# Patient Record
Sex: Female | Born: 1945 | Race: White | Hispanic: No | Smoking: Former smoker
Health system: Southern US, Community
[De-identification: ages and names within clinical notes are randomized; demographics above are authoritative.]

## PROBLEM LIST (undated history)

## (undated) DIAGNOSIS — N3941 Urge incontinence: Secondary | ICD-10-CM

## (undated) DIAGNOSIS — E119 Type 2 diabetes mellitus without complications: Secondary | ICD-10-CM

## (undated) DIAGNOSIS — F32A Depression, unspecified: Secondary | ICD-10-CM

## (undated) DIAGNOSIS — F329 Major depressive disorder, single episode, unspecified: Secondary | ICD-10-CM

## (undated) DIAGNOSIS — I10 Essential (primary) hypertension: Secondary | ICD-10-CM

## (undated) DIAGNOSIS — I739 Peripheral vascular disease, unspecified: Secondary | ICD-10-CM

## (undated) DIAGNOSIS — G8929 Other chronic pain: Secondary | ICD-10-CM

## (undated) DIAGNOSIS — C679 Malignant neoplasm of bladder, unspecified: Secondary | ICD-10-CM

## (undated) DIAGNOSIS — E785 Hyperlipidemia, unspecified: Secondary | ICD-10-CM

## (undated) DIAGNOSIS — M545 Other chronic pain: Secondary | ICD-10-CM

## (undated) DIAGNOSIS — Z862 Personal history of diseases of the blood and blood-forming organs and certain disorders involving the immune mechanism: Secondary | ICD-10-CM

## (undated) DIAGNOSIS — G43909 Migraine, unspecified, not intractable, without status migrainosus: Secondary | ICD-10-CM

## (undated) DIAGNOSIS — Z8701 Personal history of pneumonia (recurrent): Secondary | ICD-10-CM

## (undated) DIAGNOSIS — Z8709 Personal history of other diseases of the respiratory system: Secondary | ICD-10-CM

## (undated) DIAGNOSIS — E039 Hypothyroidism, unspecified: Secondary | ICD-10-CM

## (undated) DIAGNOSIS — M1990 Unspecified osteoarthritis, unspecified site: Secondary | ICD-10-CM

## (undated) DIAGNOSIS — Z9221 Personal history of antineoplastic chemotherapy: Secondary | ICD-10-CM

## (undated) DIAGNOSIS — Z9289 Personal history of other medical treatment: Secondary | ICD-10-CM

## (undated) HISTORY — PX: CATARACT EXTRACTION W/ INTRAOCULAR LENS  IMPLANT, BILATERAL: SHX1307

## (undated) HISTORY — DX: Peripheral vascular disease, unspecified: I73.9

## (undated) HISTORY — DX: Malignant neoplasm of bladder, unspecified: C67.9

## (undated) HISTORY — DX: Hyperlipidemia, unspecified: E78.5

## (undated) HISTORY — DX: Low back pain: M54.5

## (undated) HISTORY — DX: Other chronic pain: M54.50

## (undated) HISTORY — DX: Major depressive disorder, single episode, unspecified: F32.9

## (undated) HISTORY — DX: Depression, unspecified: F32.A

## (undated) HISTORY — DX: Other chronic pain: G89.29

## (undated) HISTORY — DX: Urge incontinence: N39.41

## (undated) HISTORY — PX: ABDOMINAL HYSTERECTOMY: SHX81

---
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
## 1997-12-18 HISTORY — PX: DILATION AND CURETTAGE OF UTERUS: SHX78

## 1997-12-18 HISTORY — PX: OTHER SURGICAL HISTORY: SHX169

## 1997-12-18 LAB — HM COLONOSCOPY

## 2008-12-18 HISTORY — PX: OTHER SURGICAL HISTORY: SHX169

## 2010-12-18 HISTORY — PX: OTHER SURGICAL HISTORY: SHX169

## 2012-12-18 HISTORY — PX: OTHER SURGICAL HISTORY: SHX169

## 2015-04-05 DIAGNOSIS — N3289 Other specified disorders of bladder: Secondary | ICD-10-CM | POA: Diagnosis not present

## 2015-04-21 DIAGNOSIS — C679 Malignant neoplasm of bladder, unspecified: Secondary | ICD-10-CM | POA: Diagnosis not present

## 2015-04-28 DIAGNOSIS — C679 Malignant neoplasm of bladder, unspecified: Secondary | ICD-10-CM | POA: Diagnosis not present

## 2015-05-12 DIAGNOSIS — C679 Malignant neoplasm of bladder, unspecified: Secondary | ICD-10-CM | POA: Diagnosis not present

## 2015-05-26 DIAGNOSIS — C679 Malignant neoplasm of bladder, unspecified: Secondary | ICD-10-CM | POA: Diagnosis not present

## 2015-06-02 DIAGNOSIS — C679 Malignant neoplasm of bladder, unspecified: Secondary | ICD-10-CM | POA: Diagnosis not present

## 2015-06-09 DIAGNOSIS — C679 Malignant neoplasm of bladder, unspecified: Secondary | ICD-10-CM | POA: Diagnosis not present

## 2015-06-22 DIAGNOSIS — I10 Essential (primary) hypertension: Secondary | ICD-10-CM | POA: Diagnosis not present

## 2015-06-22 DIAGNOSIS — E780 Pure hypercholesterolemia: Secondary | ICD-10-CM | POA: Diagnosis not present

## 2015-06-22 DIAGNOSIS — E119 Type 2 diabetes mellitus without complications: Secondary | ICD-10-CM | POA: Diagnosis not present

## 2015-06-29 DIAGNOSIS — Z23 Encounter for immunization: Secondary | ICD-10-CM | POA: Diagnosis not present

## 2015-06-29 DIAGNOSIS — N2889 Other specified disorders of kidney and ureter: Secondary | ICD-10-CM | POA: Diagnosis not present

## 2015-06-29 DIAGNOSIS — E780 Pure hypercholesterolemia: Secondary | ICD-10-CM | POA: Diagnosis not present

## 2015-06-29 DIAGNOSIS — I10 Essential (primary) hypertension: Secondary | ICD-10-CM | POA: Diagnosis not present

## 2015-06-29 DIAGNOSIS — E119 Type 2 diabetes mellitus without complications: Secondary | ICD-10-CM | POA: Diagnosis not present

## 2015-07-05 DIAGNOSIS — N2889 Other specified disorders of kidney and ureter: Secondary | ICD-10-CM | POA: Diagnosis not present

## 2015-07-05 DIAGNOSIS — D494 Neoplasm of unspecified behavior of bladder: Secondary | ICD-10-CM | POA: Diagnosis not present

## 2015-07-20 DIAGNOSIS — K5730 Diverticulosis of large intestine without perforation or abscess without bleeding: Secondary | ICD-10-CM | POA: Diagnosis not present

## 2015-07-20 DIAGNOSIS — N281 Cyst of kidney, acquired: Secondary | ICD-10-CM | POA: Diagnosis not present

## 2015-07-20 DIAGNOSIS — N2889 Other specified disorders of kidney and ureter: Secondary | ICD-10-CM | POA: Diagnosis not present

## 2015-08-02 DIAGNOSIS — D494 Neoplasm of unspecified behavior of bladder: Secondary | ICD-10-CM | POA: Diagnosis not present

## 2015-08-11 DIAGNOSIS — C679 Malignant neoplasm of bladder, unspecified: Secondary | ICD-10-CM | POA: Diagnosis not present

## 2015-08-18 DIAGNOSIS — C679 Malignant neoplasm of bladder, unspecified: Secondary | ICD-10-CM | POA: Diagnosis not present

## 2015-08-25 DIAGNOSIS — C679 Malignant neoplasm of bladder, unspecified: Secondary | ICD-10-CM | POA: Diagnosis not present

## 2015-09-27 DIAGNOSIS — J040 Acute laryngitis: Secondary | ICD-10-CM | POA: Diagnosis not present

## 2015-09-27 DIAGNOSIS — J029 Acute pharyngitis, unspecified: Secondary | ICD-10-CM | POA: Diagnosis not present

## 2015-09-27 DIAGNOSIS — Z23 Encounter for immunization: Secondary | ICD-10-CM | POA: Diagnosis not present

## 2015-12-01 DIAGNOSIS — M25512 Pain in left shoulder: Secondary | ICD-10-CM | POA: Diagnosis not present

## 2015-12-01 DIAGNOSIS — M898X1 Other specified disorders of bone, shoulder: Secondary | ICD-10-CM | POA: Diagnosis not present

## 2015-12-01 DIAGNOSIS — C679 Malignant neoplasm of bladder, unspecified: Secondary | ICD-10-CM | POA: Diagnosis not present

## 2015-12-01 DIAGNOSIS — S46812A Strain of other muscles, fascia and tendons at shoulder and upper arm level, left arm, initial encounter: Secondary | ICD-10-CM | POA: Diagnosis not present

## 2015-12-01 DIAGNOSIS — M19012 Primary osteoarthritis, left shoulder: Secondary | ICD-10-CM | POA: Diagnosis not present

## 2015-12-01 DIAGNOSIS — J029 Acute pharyngitis, unspecified: Secondary | ICD-10-CM | POA: Diagnosis not present

## 2015-12-08 DIAGNOSIS — C679 Malignant neoplasm of bladder, unspecified: Secondary | ICD-10-CM | POA: Diagnosis not present

## 2015-12-15 DIAGNOSIS — D494 Neoplasm of unspecified behavior of bladder: Secondary | ICD-10-CM | POA: Diagnosis not present

## 2015-12-19 DIAGNOSIS — C679 Malignant neoplasm of bladder, unspecified: Secondary | ICD-10-CM

## 2015-12-19 HISTORY — DX: Malignant neoplasm of bladder, unspecified: C67.9

## 2015-12-29 LAB — MICROALBUMIN, URINE: MICROALB UR: 1.7

## 2015-12-29 LAB — CBC AND DIFFERENTIAL
HEMATOCRIT: 40 % (ref 36–46)
HEMOGLOBIN: 13 g/dL (ref 12.0–16.0)
NEUTROS ABS: 3718 /uL
PLATELETS: 185 10*3/uL (ref 150–399)
WBC: 5.6 10^3/mL

## 2015-12-29 LAB — BASIC METABOLIC PANEL
BUN: 16 mg/dL (ref 4–21)
Creatinine: 0.8 mg/dL (ref 0.5–1.1)
Glucose: 98 mg/dL
Potassium: 5.3 mmol/L (ref 3.4–5.3)
SODIUM: 143 mmol/L (ref 137–147)

## 2015-12-29 LAB — HEPATIC FUNCTION PANEL
ALK PHOS: 71 U/L (ref 25–125)
ALT: 35 U/L (ref 7–35)
AST: 35 U/L (ref 13–35)
Bilirubin, Total: 0.7 mg/dL

## 2015-12-29 LAB — LIPID PANEL
CHOLESTEROL: 226 mg/dL — AB (ref 0–200)
HDL: 71 mg/dL — AB (ref 35–70)
LDL CALC: 122 mg/dL
TRIGLYCERIDES: 167 mg/dL — AB (ref 40–160)

## 2015-12-29 LAB — TSH: TSH: 2.81 u[IU]/mL (ref 0.41–5.90)

## 2015-12-29 LAB — HEMOGLOBIN A1C: Hemoglobin A1C: 5.9

## 2015-12-31 LAB — HM MAMMOGRAPHY: HM MAMMO: NORMAL (ref 0–4)

## 2016-05-24 LAB — HEMOGLOBIN A1C: HEMOGLOBIN A1C: 5.3

## 2016-07-26 ENCOUNTER — Emergency Department: Payer: Medicare Other

## 2016-07-26 ENCOUNTER — Encounter: Payer: Self-pay | Admitting: *Deleted

## 2016-07-26 ENCOUNTER — Emergency Department
Admission: EM | Admit: 2016-07-26 | Discharge: 2016-07-27 | Disposition: A | Discharge status: Home / Self Care | Payer: Medicare Other | Attending: Emergency Medicine | Admitting: Emergency Medicine

## 2016-07-26 DIAGNOSIS — I10 Essential (primary) hypertension: Secondary | ICD-10-CM | POA: Insufficient documentation

## 2016-07-26 DIAGNOSIS — G43909 Migraine, unspecified, not intractable, without status migrainosus: Secondary | ICD-10-CM | POA: Insufficient documentation

## 2016-07-26 DIAGNOSIS — G43009 Migraine without aura, not intractable, without status migrainosus: Secondary | ICD-10-CM

## 2016-07-26 DIAGNOSIS — R4701 Aphasia: Secondary | ICD-10-CM | POA: Diagnosis not present

## 2016-07-26 DIAGNOSIS — E119 Type 2 diabetes mellitus without complications: Secondary | ICD-10-CM | POA: Diagnosis not present

## 2016-07-26 DIAGNOSIS — R51 Headache: Secondary | ICD-10-CM | POA: Diagnosis not present

## 2016-07-26 HISTORY — DX: Essential (primary) hypertension: I10

## 2016-07-26 HISTORY — DX: Type 2 diabetes mellitus without complications: E11.9

## 2016-07-26 MED ORDER — MORPHINE SULFATE (PF) 2 MG/ML IV SOLN
2.0000 mg | Freq: Once | INTRAVENOUS | Status: AC
  Administered 2016-07-27: 2 mg via INTRAVENOUS
  Filled 2016-07-26: qty 1

## 2016-07-26 MED ORDER — HALOPERIDOL LACTATE 5 MG/ML IJ SOLN
2.5000 mg | Freq: Once | INTRAMUSCULAR | Status: AC
  Administered 2016-07-27: 2.5 mg via INTRAVENOUS
  Filled 2016-07-26: qty 1

## 2016-07-26 MED ORDER — ACETAMINOPHEN 325 MG PO TABS
650.0000 mg | ORAL_TABLET | Freq: Once | ORAL | Status: AC
  Administered 2016-07-27: 650 mg via ORAL
  Filled 2016-07-26: qty 2

## 2016-07-26 MED ORDER — SODIUM CHLORIDE 0.9 % IV BOLUS (SEPSIS)
500.0000 mL | INTRAVENOUS | Status: AC
  Administered 2016-07-27: 500 mL via INTRAVENOUS

## 2016-07-26 NOTE — ED Triage Notes (Signed)
Pt to triage via wheelchair.  Pt reports she has a headache since 1730 today.  Pt has n/v/.  Pt reports she took asa and allegra d without relief.  Pt anxious in triage.  Pt denies fall/injury.  Pt reports a headache 1 week ago but pain got better.  Pt alert.  Speech clear.

## 2016-07-26 NOTE — ED Provider Notes (Signed)
[facility] Emergency Department Provider Note  ____________________________________________   First MD Initiated Contact with Patient 07/26/16 2331     (approximate)  I have reviewed the triage vital signs and the nursing notes.   HISTORY  Chief Complaint Migraine    HPI Stacy Mercado is a 70 y.o. female with no significant past medical history and no history of chronic headaches who presents with a severe headache that she states started gradually but is becoming very severe over the last few hours.  She has had some nausea and several episodes of vomiting as well.  She reports that it is located in the front left part of her head, not involving her eye, and is sharp and throbbing.  Nothing makes it better and light and noise seemed to make it worse.  She has no pain in her neck.  She has not been ill recently.  She denies fever/chills, visual changes, weakness or numbness in any of her extremities, neck pain, abdominal pain, dysuria.  She reports that she had a similar but milder episode about one week ago and that her head hurt for about a day and then it got better.  He only thing that was different about tonight besides the severity was that she reports that when it was at its most severe at home she was trying to talk to her husband but she felt like it was difficult getting her words out.   Past Medical History:  Diagnosis Date  . Diabetes mellitus without complication ([place])   . Hypertension     There are no active problems to display for this patient.   History reviewed. No pertinent surgical history.  Prior to Admission medications   Medication Sig Start Date End Date Taking? Authorizing Provider  docusate sodium (COLACE) 100 MG capsule Take 1 tablet once or twice daily as needed for constipation while taking narcotic pain medicine 07/27/16   Hinda Kehr, MD  HYDROcodone-acetaminophen (NORCO/VICODIN) 5-325 MG tablet Take 1-2 tablets by mouth  every 4 (four) hours as needed for moderate pain. 07/27/16   Hinda Kehr, MD    Allergies Review of patient's allergies indicates no known allergies.  History reviewed. No pertinent family history.  Social History Social History  Substance Use Topics  . Smoking status: Never Smoker  . Smokeless tobacco: Never Used  . Alcohol use Yes    Review of Systems Constitutional: No fever/chills Eyes: No visual changes. ENT: No sore throat. Cardiovascular: Denies chest pain. Respiratory: Denies shortness of breath. Gastrointestinal: No abdominal pain.  +N/V.  No diarrhea.  No constipation. Genitourinary: Negative for dysuria. Musculoskeletal: Negative for back pain. Skin: Negative for rash. Neurological: Severe left-sided frontal headache.  Questionable expressive aphasia  10-point ROS otherwise negative.  ____________________________________________   PHYSICAL EXAM:  VITAL SIGNS: ED Triage Vitals  Enc Vitals Group     BP 07/26/16 2037 129/82     Pulse Rate 07/26/16 2037 68     Resp 07/26/16 2037 20     Temp 07/26/16 2037 97.6 F (36.4 C)     Temp Source 07/26/16 2037 Oral     SpO2 07/26/16 2037 100 %     Weight 07/26/16 2037 162 lb (73.5 kg)     Height 07/26/16 2037 5\' 5"  (1.651 m)     Head Circumference --      Peak Flow --      Pain Score 07/26/16 2039 10     Pain Loc --  Pain Edu? --      Excl. in [place]? --     Constitutional: Alert and oriented. Holding her head and appears very uncomfortable Eyes: Conjunctivae are normal. PERRL. EOMI. no papilledema on funduscopic exam Head: Atraumatic. Nose: No congestion/rhinnorhea. Mouth/Throat: Mucous membranes are moist.  Oropharynx non-erythematous. Neck: No stridor.  No meningeal signs.   Cardiovascular: Normal rate, regular rhythm. Good peripheral circulation. Grossly normal heart sounds.   Respiratory: Normal respiratory effort.  No retractions. Lungs CTAB. Gastrointestinal: Soft and nontender. No distention.    Musculoskeletal: No lower extremity tenderness nor edema. No gross deformities of extremities. Neurologic:  Normal speech and language. No gross focal neurologic deficits are appreciated.  No gross cranial nerve deficits Skin:  Skin is warm, dry and intact. No rash noted. Psychiatric: Mood and affect are normal. Speech and behavior are normal.  ____________________________________________   LABS (all labs ordered are listed, but only abnormal results are displayed)  Labs Reviewed  URINALYSIS COMPLETEWITH MICROSCOPIC ([place]) - Abnormal; Notable for the following:       Result Value   Color, Urine YELLOW (*)    APPearance CLEAR (*)    All other components within normal limits  COMPREHENSIVE METABOLIC PANEL - Abnormal; Notable for the following:    Glucose, Bld 101 (*)    Calcium 10.4 (*)    All other components within normal limits  CBC WITH DIFFERENTIAL/PLATELET  SEDIMENTATION RATE  MAGNESIUM   ____________________________________________  EKG  None ____________________________________________  RADIOLOGY   Ct Head Wo Contrast  Result Date: 07/26/2016 CLINICAL DATA:  headache since 1730 today. Pt has n/v/. Pt reports she took asa and allegra d without relief. Pt anxious in triage. Pt denies fall/injury. Pt reports a headache 1 week ago but pain got better. Pt alert. Speech clear. EXAM: CT HEAD WITHOUT CONTRAST TECHNIQUE: Contiguous axial images were obtained from the base of the skull through the vertex without intravenous contrast. COMPARISON:  None. FINDINGS: Brain: Mild atrophy. No evidence of acute infarction, hemorrhage, extra-axial collection, ventriculomegaly, or mass effect. Vascular: No hyperdense vessel or unexpected calcification. Atherosclerotic and physiologic intracranial calcifications. Skull: Negative for fracture or focal lesion. Sinuses/Orbits: No acute findings. Other: None. IMPRESSION: Mild atrophy.  No bleed or other acute intracranial process.  Electronically Signed   By: Lucrezia Europe M.D.   On: 07/26/2016 21:23   Mr Brain Wo Contrast  Result Date: 07/27/2016 CLINICAL DATA:  Initial evaluation for severe headache with expressive aphasia. EXAM: MRI HEAD WITHOUT CONTRAST TECHNIQUE: Multiplanar, multiecho pulse sequences of the brain and surrounding structures were obtained without intravenous contrast. COMPARISON:  Prior CT from 07/26/2016. FINDINGS: Study mildly degraded by motion artifact. Age appropriate cerebral atrophy present. Minimal patchy T2/FLAIR hyperintensity within the periventricular and deep white matter both cerebral hemispheres present, nonspecific, but most likely related to chronic small vessel ischemic disease, felt to be within normal limits for patient age. No abnormal foci of restricted diffusion to suggest acute intracranial ischemia. Gray-white matter differentiation maintained. Major intracranial vascular flow voids are preserved. No acute or chronic intracranial hemorrhage parent no areas of chronic infarction. No mass lesion, midline shift, or mass effect. No hydrocephalus. No extra-axial fluid collection. Major dural sinuses are grossly patent. Cerebellar tonsils minimally low lying within the foramen magnum but within normal limits. No frank Chiari malformation. Visualized upper cervical spine demonstrates mild degenerative spondylolysis without significant stenosis. Pituitary gland within normal limits. No acute abnormality about the globes and orbits. Axial myopia noted. Patient is status post lens extraction  bilaterally. Scattered mucosal thickening within the ethmoidal air cells and maxillary sinuses. Superimposed retention cyst within the left maxillary sinus. No air-fluid level to suggest active sinus infection. No mastoid effusion. Inner ear structures grossly normal. Bone marrow signal intensity within normal limits. No scalp soft tissue abnormality. IMPRESSION: Negative brain MRI for patient age. No acute intracranial  infarct or other abnormality identified. Electronically Signed   By: Jeannine Boga M.D.   On: 07/27/2016 01:51    ____________________________________________   PROCEDURES  Procedure(s) performed:   Procedures   Critical Care performed: No ____________________________________________   INITIAL IMPRESSION / ASSESSMENT AND PLAN / ED COURSE  Pertinent labs & imaging results that were available during my care of the patient were reviewed by me and considered in my medical decision making (see chart for details).  Given the patient has no history of chronic headaches or migraines and that she is 69 years old, evaluated her broadly with extensive lab work including a sedimentation rate.  A noncontrast head CT was normal and given the report of possible expressive aphasia I also obtained an MR brain noncontrast.  All of her workup was reassuring and within normal limits.  Given her age, I treated her with a modified migraine cocktail of 50 mg of Toradol, 500 mL of normal saline, and Haldol 2.5 mg IV that was injected into the normal saline.  I also gave her morphine 2 mg for more immediate relief.  She also required Ativan 1 mg to tolerate the MRI.  Hours later she states that her pain is improved down to a 2 and she feels comfortable with the plan to go home.  I encouraged her to follow up as an outpatient at the next available opportunity to discuss with her primary care doctor this new onset of headaches and additional outpatient management options.    I gave my usual and customary return precautions.      ____________________________________________  FINAL CLINICAL IMPRESSION(S) / ED DIAGNOSES  Final diagnoses:  Nonintractable migraine, unspecified migraine type     MEDICATIONS GIVEN DURING THIS VISIT:  Medications  haloperidol lactate (HALDOL) injection 2.5 mg (2.5 mg Intravenous Given 07/27/16 0010)  sodium chloride 0.9 % bolus 500 mL (0 mLs Intravenous Stopped 07/27/16  0047)  acetaminophen (TYLENOL) tablet 650 mg (650 mg Oral Given 07/27/16 0010)  morphine 2 MG/ML injection 2 mg (2 mg Intravenous Given 07/27/16 0010)  LORazepam (ATIVAN) injection 1 mg (1 mg Intravenous Given 07/27/16 0047)     NEW OUTPATIENT MEDICATIONS STARTED DURING THIS VISIT:  New Prescriptions   DOCUSATE SODIUM (COLACE) 100 MG CAPSULE    Take 1 tablet once or twice daily as needed for constipation while taking narcotic pain medicine   HYDROCODONE-ACETAMINOPHEN (NORCO/VICODIN) 5-325 MG TABLET    Take 1-2 tablets by mouth every 4 (four) hours as needed for moderate pain.      Note:  This document was prepared using Dragon voice recognition software and may include unintentional dictation errors.    Hinda Kehr, MD 07/27/16 [phone]

## 2016-07-27 ENCOUNTER — Emergency Department: Payer: Medicare Other

## 2016-07-27 DIAGNOSIS — G43909 Migraine, unspecified, not intractable, without status migrainosus: Secondary | ICD-10-CM | POA: Diagnosis not present

## 2016-07-27 DIAGNOSIS — R4701 Aphasia: Secondary | ICD-10-CM | POA: Diagnosis not present

## 2016-07-27 LAB — CBC WITH DIFFERENTIAL/PLATELET
Basophils Absolute: 0.1 10*3/uL (ref 0–0.1)
Basophils Relative: 1 %
EOS ABS: 0.1 10*3/uL (ref 0–0.7)
EOS PCT: 1 %
HCT: 39.8 % (ref 35.0–47.0)
Hemoglobin: 13.4 g/dL (ref 12.0–16.0)
LYMPHS ABS: 2.4 10*3/uL (ref 1.0–3.6)
LYMPHS PCT: 25 %
MCH: 30.9 pg (ref 26.0–34.0)
MCHC: 33.7 g/dL (ref 32.0–36.0)
MCV: 91.6 fL (ref 80.0–100.0)
MONO ABS: 0.8 10*3/uL (ref 0.2–0.9)
Monocytes Relative: 8 %
Neutro Abs: 6.3 10*3/uL (ref 1.4–6.5)
Neutrophils Relative %: 65 %
PLATELETS: 183 10*3/uL (ref 150–440)
RBC: 4.35 MIL/uL (ref 3.80–5.20)
RDW: 12.7 % (ref 11.5–14.5)
WBC: 9.6 10*3/uL (ref 3.6–11.0)

## 2016-07-27 LAB — COMPREHENSIVE METABOLIC PANEL
ALT: 25 U/L (ref 14–54)
ANION GAP: 8 (ref 5–15)
AST: 26 U/L (ref 15–41)
Albumin: 4.2 g/dL (ref 3.5–5.0)
Alkaline Phosphatase: 61 U/L (ref 38–126)
BILIRUBIN TOTAL: 0.5 mg/dL (ref 0.3–1.2)
BUN: 14 mg/dL (ref 6–20)
CO2: 28 mmol/L (ref 22–32)
Calcium: 10.4 mg/dL — ABNORMAL HIGH (ref 8.9–10.3)
Chloride: 103 mmol/L (ref 101–111)
Creatinine, Ser: 0.76 mg/dL (ref 0.44–1.00)
Glucose, Bld: 101 mg/dL — ABNORMAL HIGH (ref 65–99)
POTASSIUM: 4.3 mmol/L (ref 3.5–5.1)
Sodium: 139 mmol/L (ref 135–145)
TOTAL PROTEIN: 7.2 g/dL (ref 6.5–8.1)

## 2016-07-27 LAB — URINALYSIS COMPLETE WITH MICROSCOPIC (ARMC ONLY)
Bilirubin Urine: NEGATIVE
GLUCOSE, UA: NEGATIVE mg/dL
HGB URINE DIPSTICK: NEGATIVE
KETONES UR: NEGATIVE mg/dL
Leukocytes, UA: NEGATIVE
NITRITE: NEGATIVE
PROTEIN: NEGATIVE mg/dL
SPECIFIC GRAVITY, URINE: 1.014 (ref 1.005–1.030)
pH: 7 (ref 5.0–8.0)

## 2016-07-27 LAB — MAGNESIUM: MAGNESIUM: 1.8 mg/dL (ref 1.7–2.4)

## 2016-07-27 LAB — SEDIMENTATION RATE: SED RATE: 11 mm/h (ref 0–30)

## 2016-07-27 MED ORDER — DOCUSATE SODIUM 100 MG PO CAPS: ORAL_CAPSULE | ORAL | 0 refills | Status: DC

## 2016-07-27 MED ORDER — LORAZEPAM 2 MG/ML IJ SOLN
1.0000 mg | Freq: Once | INTRAMUSCULAR | Status: AC
  Administered 2016-07-27: 1 mg via INTRAVENOUS
  Filled 2016-07-27: qty 1

## 2016-07-27 MED ORDER — HYDROCODONE-ACETAMINOPHEN 5-325 MG PO TABS: 1.0000 | ORAL_TABLET | ORAL | 0 refills | Status: DC | PRN

## 2016-07-27 NOTE — ED Notes (Signed)
Discharge instructions reviewed with patient. Patient verbalized understanding. Patient taken to lobby via wheelchair and helped into vehicle without difficulty.  

## 2016-07-27 NOTE — ED Notes (Signed)
Helped pt to the bathroom

## 2016-07-27 NOTE — ED Notes (Signed)
Pt taken to MRI via stretcher by Deniece Ree, EDT

## 2016-07-27 NOTE — Discharge Instructions (Signed)

## 2016-08-07 ENCOUNTER — Other Ambulatory Visit: Payer: Self-pay | Admitting: Family Medicine

## 2016-08-07 ENCOUNTER — Encounter: Payer: Self-pay | Admitting: Family Medicine

## 2016-08-07 DIAGNOSIS — I10 Essential (primary) hypertension: Secondary | ICD-10-CM

## 2016-08-07 DIAGNOSIS — F329 Major depressive disorder, single episode, unspecified: Secondary | ICD-10-CM | POA: Insufficient documentation

## 2016-08-07 DIAGNOSIS — N2889 Other specified disorders of kidney and ureter: Secondary | ICD-10-CM

## 2016-08-07 DIAGNOSIS — E785 Hyperlipidemia, unspecified: Secondary | ICD-10-CM

## 2016-08-07 DIAGNOSIS — E119 Type 2 diabetes mellitus without complications: Secondary | ICD-10-CM | POA: Insufficient documentation

## 2016-08-07 DIAGNOSIS — I129 Hypertensive chronic kidney disease with stage 1 through stage 4 chronic kidney disease, or unspecified chronic kidney disease: Secondary | ICD-10-CM

## 2016-08-07 DIAGNOSIS — M47812 Spondylosis without myelopathy or radiculopathy, cervical region: Secondary | ICD-10-CM | POA: Insufficient documentation

## 2016-08-07 DIAGNOSIS — N3941 Urge incontinence: Secondary | ICD-10-CM

## 2016-08-07 DIAGNOSIS — N189 Chronic kidney disease, unspecified: Secondary | ICD-10-CM

## 2016-08-07 DIAGNOSIS — C679 Malignant neoplasm of bladder, unspecified: Secondary | ICD-10-CM

## 2016-08-07 DIAGNOSIS — F32A Depression, unspecified: Secondary | ICD-10-CM | POA: Insufficient documentation

## 2016-08-07 DIAGNOSIS — E1122 Type 2 diabetes mellitus with diabetic chronic kidney disease: Secondary | ICD-10-CM

## 2016-08-07 DIAGNOSIS — IMO0001 Reserved for inherently not codable concepts without codable children: Secondary | ICD-10-CM

## 2016-08-07 DIAGNOSIS — I739 Peripheral vascular disease, unspecified: Secondary | ICD-10-CM | POA: Insufficient documentation

## 2016-08-07 HISTORY — DX: Urge incontinence: N39.41

## 2016-08-07 HISTORY — DX: Hyperlipidemia, unspecified: E78.5

## 2016-08-07 NOTE — Progress Notes (Signed)
Chart reviewed and abstracted

## 2016-08-11 ENCOUNTER — Emergency Department: Payer: Medicare Other

## 2016-08-11 ENCOUNTER — Encounter: Payer: Self-pay | Admitting: Emergency Medicine

## 2016-08-11 ENCOUNTER — Emergency Department
Admission: EM | Admit: 2016-08-11 | Discharge: 2016-08-11 | Disposition: A | Discharge status: Home / Self Care | Payer: Medicare Other | Attending: Emergency Medicine | Admitting: Emergency Medicine

## 2016-08-11 DIAGNOSIS — W01198A Fall on same level from slipping, tripping and stumbling with subsequent striking against other object, initial encounter: Secondary | ICD-10-CM | POA: Diagnosis not present

## 2016-08-11 DIAGNOSIS — S299XXA Unspecified injury of thorax, initial encounter: Secondary | ICD-10-CM | POA: Diagnosis present

## 2016-08-11 DIAGNOSIS — S4991XA Unspecified injury of right shoulder and upper arm, initial encounter: Secondary | ICD-10-CM | POA: Diagnosis not present

## 2016-08-11 DIAGNOSIS — M7531 Calcific tendinitis of right shoulder: Secondary | ICD-10-CM | POA: Insufficient documentation

## 2016-08-11 DIAGNOSIS — I129 Hypertensive chronic kidney disease with stage 1 through stage 4 chronic kidney disease, or unspecified chronic kidney disease: Secondary | ICD-10-CM | POA: Diagnosis not present

## 2016-08-11 DIAGNOSIS — Y92017 Garden or yard in single-family (private) house as the place of occurrence of the external cause: Secondary | ICD-10-CM | POA: Insufficient documentation

## 2016-08-11 DIAGNOSIS — Y999 Unspecified external cause status: Secondary | ICD-10-CM | POA: Diagnosis not present

## 2016-08-11 DIAGNOSIS — Y9389 Activity, other specified: Secondary | ICD-10-CM | POA: Diagnosis not present

## 2016-08-11 DIAGNOSIS — Z7984 Long term (current) use of oral hypoglycemic drugs: Secondary | ICD-10-CM | POA: Diagnosis not present

## 2016-08-11 DIAGNOSIS — N189 Chronic kidney disease, unspecified: Secondary | ICD-10-CM | POA: Diagnosis not present

## 2016-08-11 DIAGNOSIS — Z791 Long term (current) use of non-steroidal anti-inflammatories (NSAID): Secondary | ICD-10-CM | POA: Insufficient documentation

## 2016-08-11 DIAGNOSIS — Z87891 Personal history of nicotine dependence: Secondary | ICD-10-CM | POA: Insufficient documentation

## 2016-08-11 DIAGNOSIS — M25511 Pain in right shoulder: Secondary | ICD-10-CM

## 2016-08-11 DIAGNOSIS — E1122 Type 2 diabetes mellitus with diabetic chronic kidney disease: Secondary | ICD-10-CM | POA: Diagnosis not present

## 2016-08-11 DIAGNOSIS — M542 Cervicalgia: Secondary | ICD-10-CM | POA: Insufficient documentation

## 2016-08-11 DIAGNOSIS — S199XXA Unspecified injury of neck, initial encounter: Secondary | ICD-10-CM | POA: Diagnosis not present

## 2016-08-11 DIAGNOSIS — S20211A Contusion of right front wall of thorax, initial encounter: Secondary | ICD-10-CM | POA: Insufficient documentation

## 2016-08-11 DIAGNOSIS — R0781 Pleurodynia: Secondary | ICD-10-CM | POA: Diagnosis not present

## 2016-08-11 DIAGNOSIS — Z8551 Personal history of malignant neoplasm of bladder: Secondary | ICD-10-CM | POA: Insufficient documentation

## 2016-08-11 MED ORDER — HYDROCODONE-ACETAMINOPHEN 5-325 MG PO TABS: 1.0000 | ORAL_TABLET | Freq: Four times a day (QID) | ORAL | 0 refills | Status: DC | PRN

## 2016-08-11 MED ORDER — KETOROLAC TROMETHAMINE 60 MG/2ML IM SOLN
30.0000 mg | Freq: Once | INTRAMUSCULAR | Status: AC
  Administered 2016-08-11: 30 mg via INTRAMUSCULAR
  Filled 2016-08-11: qty 2

## 2016-08-11 MED ORDER — ACETAMINOPHEN 325 MG PO TABS
650.0000 mg | ORAL_TABLET | Freq: Once | ORAL | Status: AC
  Administered 2016-08-11: 650 mg via ORAL
  Filled 2016-08-11: qty 2

## 2016-08-11 MED ORDER — NAPROXEN 500 MG PO TBEC: 500.0000 mg | DELAYED_RELEASE_TABLET | Freq: Two times a day (BID) | ORAL | 0 refills | Status: DC

## 2016-08-11 NOTE — ED Provider Notes (Signed)
[facility] Emergency Department Provider Note ____________________________________________  Time seen: 1213  I have reviewed the triage vital signs and the nursing notes.  HISTORY  Chief Complaint  Fall; Shoulder Pain; Neck Pain; and Chest Pain (RIB CAGE PAIN)  HPI Stacy Mercado is a 70 y.o. female presents herself to the EDfor evaluation of that she sustained following a fall yesterday. Patient describes being in her yard, when she accidentally tripped over her raise garden bed. She describes followed primarily to the right side, she denies hitting her head but reports pain to the right side of the neck as well as the right shoulder and ribs. She reports pain with movement of the shoulder in particular. She denies any shortness of breath, cough, or congestion. She's not experiencing any hemoptysis, or distal paresthesias. She does admit to dosing her previously prescribed baclofen as directed yesterday 2 doses. She also applied ice pack and had a dose of ibuprofen last night. Today she took a dose of Advil, but continues to report pain. She is here for further evaluation.  Past Medical History:  Diagnosis Date  . Benign hypertensive renal disease   . Bladder cancer ([place])   . Chronic low back pain   . CKD (chronic kidney disease)   . Depression   . Hyperlipidemia 08/07/2016  . PVD (peripheral vascular disease) ([place])   . 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 Urge incontinence of urine 08/07/2016    Patient Active Problem List   Diagnosis Date Noted  . Right kidney mass 08/07/2016  . Hyperlipidemia 08/07/2016  . Urge incontinence of urine 08/07/2016  . 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 Diabetes mellitus without complication ([place])   . Hypertension   . CKD (chronic kidney disease)   . Bladder cancer ([place])   . 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 Depression   . Benign hypertensive renal disease   . PVD (peripheral vascular  disease) ([place])     Past Surgical History:  Procedure Laterality Date  . APPENDECTOMY  1961  . bladder cancer removed  2014  . complete hysterectomy  1999  . DILATION AND CURETTAGE OF UTERUS  1999  . L rotator cuff Left 2010  . R thumb ruptured ligament and tendon  2012    Prior to Admission medications   Medication Sig Start Date End Date Taking? Authorizing Provider  acetaminophen (TYLENOL) 500 MG tablet Take 2 tablets (1,000 mg total) by mouth every 6 (six) hours as needed. 08/07/16   Megan P Johnson, DO  baclofen (LIORESAL) 10 MG tablet Take 1 tablet (10 mg total) by mouth 2 (two) times daily. 08/07/16   Megan P Johnson, DO  docusate sodium (COLACE) 100 MG capsule Take 1 tablet once or twice daily as needed for constipation while taking narcotic pain medicine 07/27/16   Hinda Kehr, MD  fluticasone [facility]) 50 MCG/ACT nasal spray Place 2 sprays into both nostrils daily. 08/07/16   Megan P Johnson, DO  Glucosamine-Chondroitin-MSM 500-200-150 MG TABS Take 1 tablet by mouth daily. 08/07/16   Megan P Johnson, DO  HYDROcodone-acetaminophen (NORCO) 5-325 MG tablet Take 1 tablet by mouth every 6 (six) hours as needed. 08/11/16   Bevely Hackbart V Bacon Avabella Wailes, PA-C  losartan (COZAAR) 50 MG tablet Take 1 tablet (50 mg total) by mouth daily. 08/07/16   Megan P Johnson, DO  metFORMIN (GLUCOPHAGE) 1000 MG tablet Take 1 tablet (1,000 mg total) by mouth 2 (two) times daily  with a meal. 08/07/16   Megan P Johnson, DO  naproxen (EC NAPROSYN) 500 MG EC tablet Take 1 tablet (500 mg total) by mouth 2 (two) times daily with a meal. 08/11/16   Amontae Ng V Bacon Chinita Schimpf, PA-C  oxybutynin (DITROPAN XL) 5 MG 24 hr tablet Take 1 tablet (5 mg total) by mouth at bedtime. 08/07/16   Megan P Johnson, DO  PARoxetine (PAXIL) 20 MG tablet Take 1 tablet (20 mg total) by mouth daily. 08/07/16   Megan P Johnson, DO  traMADol (ULTRAM) 50 MG tablet Take 1 tablet (50 mg total) by mouth daily as needed. 08/07/16   Megan P Johnson, DO     Allergies Review of patient's allergies indicates no known allergies.  Family History  Problem Relation Age of Onset  . Hypertension Brother   . Diabetes Brother   . Healthy Son   . Healthy Son   . Healthy Daughter     Social History Social History  Substance Use Topics  . Smoking status: Former Smoker    Years: 50.00    Types: Cigarettes  . Smokeless tobacco: Never Used  . Alcohol use Yes   Review of Systems  Constitutional: Negative for fever. Cardiovascular: Negative for chest pain. Respiratory: Negative for shortness of breath. Gastrointestinal: Negative for abdominal pain, vomiting and diarrhea. Musculoskeletal: Positive for right neck pain, right rib pain, and right shoulder pain.  Skin: Negative for rash. Neurological: Negative for headaches, focal weakness or numbness. ____________________________________________  PHYSICAL EXAM:  VITAL SIGNS: ED Triage Vitals [08/11/16 1153]  Enc Vitals Group     BP 129/80     Pulse Rate (!) 54     Resp      Temp 98.3 F (36.8 C)     Temp Source Oral     SpO2 98 %     Weight 162 lb (73.5 kg)     Height 5\' 5"  (1.651 m)     Head Circumference      Peak Flow      Pain Score 10     Pain Loc      Pain Edu?      Excl. in [place]?    Constitutional: Alert and oriented. Well appearing and in no distress. Head: Normocephalic and atraumatic. Cardiovascular: Normal rate, regular rhythm.  Respiratory: Normal respiratory effort. No wheezes/rales/rhonchi. Gastrointestinal: Soft and nontender. No distention. Musculoskeletal: Patient without any obvious deformity to the right shoulder or any obvious contusion or ecchymosis to the right ribs. She is noted to be tender to palpation over the anterior lateral shoulder girdle. She is able to demonstrate normal elbow flexion and extension range and the wrist exam is unremarkable. Patient with decreased abduction to the right shoulder secondary to pain. Normal composite fist and grip  strength bilaterally. Nontender with normal range of motion in all other extremities.  Neurologic: Cranial nerves II through XII grossly intact. Normal intrinsic and opposition testing. Normal gait without ataxia. Normal speech and language. No gross focal neurologic deficits are appreciated. Skin:  Skin is warm, dry and intact. No rash noted. Psychiatric: Mood and affect are normal. Patient exhibits appropriate insight and judgment. ____________________________________________   RADIOLOGY  Right Shoulder  IMPRESSION: Calcifications lateral to the greater tuberosity likely are due to calcific tendinosis. A small avulsion injury in this area cannot be excluded. Both avulsion injury and calcific tendinosis could exist concurrently in this area. No other evidence suggesting fracture. No dislocation. Moderate generalized osteoarthritic change noted.  Cervical Spine  IMPRESSION: Osteoarthritic  change at several levels. No fracture or Spondylolisthesis.  Right Rib Detail  IMPRESSION: No rib fracture evident. No pneumothorax. Mild left base scarring. Lungs elsewhere clear ____________________________________________  PROCEDURES  Toradol 30 mg IM Tylenol 650 mg PO ____________________________________________  INITIAL IMPRESSION / ASSESSMENT AND PLAN / ED COURSE  Patient with negative x-rays for the neck and ribs. She has calcific tendinosis of the right shoulder with possible underlying avulsion injury on x-ray. Her exam appears consistent with an acute tendinitis following the fall, as well as rib contusions and neck strain. Patient will be discharged with a prescription for hydrocodone as well as for Naprosyn. She will continue with her previously prescribed baclofen for muscle pain relief. She will follow-up with Dr. Mack Guise for ongoing management.  Clinical Course   ____________________________________________  FINAL CLINICAL IMPRESSION(S) / ED DIAGNOSES  Final diagnoses:   Right shoulder pain  Calcific tendinitis of shoulder, right  Rib contusion, right, initial encounter      Melvenia Needles, PA-C 08/11/16 [place], MD 08/11/16 1544

## 2016-08-11 NOTE — ED Triage Notes (Signed)
Pt presents to the ED via POV by herself. Pt states she fell around 230pm.  Pt state she was working in the yard yesterday when she tripped over a wooden platform at her house and fell onto her right side. Pt c/o right neck pain, right shoulder pain, and right side rib pain. Pt denies any LOC.  Pt states she is concerned about her ribs.

## 2016-08-11 NOTE — ED Notes (Signed)
Patient transported to X-ray 

## 2016-08-11 NOTE — Discharge Instructions (Signed)
Take the prescription meds as directed. Follow-up with your provider or Dr. Mack Guise as needed. Apply ice to any sore muscles.

## 2016-08-14 ENCOUNTER — Ambulatory Visit (INDEPENDENT_AMBULATORY_CARE_PROVIDER_SITE_OTHER): Payer: Medicare Other | Admitting: Family Medicine

## 2016-08-14 ENCOUNTER — Encounter: Payer: Self-pay | Admitting: Family Medicine

## 2016-08-14 VITALS — BP 129/76 | HR 54 | Temp 98.1°F | Ht 64.6 in | Wt 169.0 lb

## 2016-08-14 DIAGNOSIS — E785 Hyperlipidemia, unspecified: Secondary | ICD-10-CM

## 2016-08-14 DIAGNOSIS — F329 Major depressive disorder, single episode, unspecified: Secondary | ICD-10-CM

## 2016-08-14 DIAGNOSIS — H269 Unspecified cataract: Secondary | ICD-10-CM | POA: Diagnosis not present

## 2016-08-14 DIAGNOSIS — N3941 Urge incontinence: Secondary | ICD-10-CM | POA: Diagnosis not present

## 2016-08-14 DIAGNOSIS — C679 Malignant neoplasm of bladder, unspecified: Secondary | ICD-10-CM | POA: Diagnosis not present

## 2016-08-14 DIAGNOSIS — M7531 Calcific tendinitis of right shoulder: Secondary | ICD-10-CM | POA: Diagnosis not present

## 2016-08-14 DIAGNOSIS — E1122 Type 2 diabetes mellitus with diabetic chronic kidney disease: Secondary | ICD-10-CM

## 2016-08-14 DIAGNOSIS — I129 Hypertensive chronic kidney disease with stage 1 through stage 4 chronic kidney disease, or unspecified chronic kidney disease: Secondary | ICD-10-CM

## 2016-08-14 DIAGNOSIS — IMO0001 Reserved for inherently not codable concepts without codable children: Secondary | ICD-10-CM

## 2016-08-14 DIAGNOSIS — N390 Urinary tract infection, site not specified: Secondary | ICD-10-CM

## 2016-08-14 DIAGNOSIS — F32A Depression, unspecified: Secondary | ICD-10-CM

## 2016-08-14 DIAGNOSIS — R8281 Pyuria: Secondary | ICD-10-CM

## 2016-08-14 DIAGNOSIS — M7581 Other shoulder lesions, right shoulder: Secondary | ICD-10-CM | POA: Insufficient documentation

## 2016-08-14 DIAGNOSIS — Z113 Encounter for screening for infections with a predominantly sexual mode of transmission: Secondary | ICD-10-CM

## 2016-08-14 DIAGNOSIS — N189 Chronic kidney disease, unspecified: Secondary | ICD-10-CM | POA: Diagnosis not present

## 2016-08-14 MED ORDER — LOSARTAN POTASSIUM 50 MG PO TABS: 50.0000 mg | ORAL_TABLET | Freq: Every day | ORAL | 1 refills | Status: DC

## 2016-08-14 MED ORDER — METFORMIN HCL 1000 MG PO TABS: 1000.0000 mg | ORAL_TABLET | Freq: Two times a day (BID) | ORAL | 1 refills | Status: DC

## 2016-08-14 MED ORDER — PAROXETINE HCL 20 MG PO TABS: 20.0000 mg | ORAL_TABLET | Freq: Every day | ORAL | 1 refills | Status: DC

## 2016-08-14 MED ORDER — FLUTICASONE PROPIONATE 50 MCG/ACT NA SUSP: 2.0000 | Freq: Every day | NASAL | 12 refills | Status: DC

## 2016-08-14 NOTE — Assessment & Plan Note (Signed)
Was on oxybutinin earlier. Getting in to see urology ASAP.

## 2016-08-14 NOTE — Assessment & Plan Note (Signed)
Referral to urology made today. Will see them ASAP.

## 2016-08-14 NOTE — Patient Instructions (Addendum)
Ezetimibe Tablets What is this medicine? EZETIMIBE (ez ET i mibe) blocks the absorption of cholesterol from the stomach. It can help lower blood cholesterol for patients who are at risk of getting heart disease or a stroke. It is only for patients whose cholesterol level is not controlled by diet. This medicine may be used for other purposes; ask your health care provider or pharmacist if you have questions. What should I tell my health care provider before I take this medicine? They need to know if you have any of these conditions: -liver disease -an unusual or allergic reaction to ezetimibe, medicines, foods, dyes, or preservatives -pregnant or trying to get pregnant -breast-feeding How should I use this medicine? Take this medicine by mouth with a glass of water. Follow the directions on the prescription label. This medicine can be taken with or without food. Take your doses at regular intervals. Do not take your medicine more often than directed. Talk to your pediatrician regarding the use of this medicine in children. Special care may be needed. Overdosage: If you think you have taken too much of this medicine contact a poison control center or emergency room at once. NOTE: This medicine is only for you. Do not share this medicine with others. What if I miss a dose? If you miss a dose, take it as soon as you can. If it is almost time for your next dose, take only that dose. Do not take double or extra doses. What may interact with this medicine? Do not take this medicine with any of the following medications: -fenofibrate -gemfibrozil This medicine may also interact with the following medications: -antacids -cyclosporine -herbal medicines like red yeast rice -other medicines to lower cholesterol or triglycerides This list may not describe all possible interactions. Give your health care provider a list of all the medicines, herbs, non-prescription drugs, or dietary supplements you use.  Also tell them if you smoke, drink alcohol, or use illegal drugs. Some items may interact with your medicine. What should I watch for while using this medicine? Visit your doctor or health care professional for regular checks on your progress. You will need to have your cholesterol levels checked. If you are also taking some other cholesterol medicines, you will also need to have tests to make sure your liver is working properly. Tell your doctor or health care professional if you get any unexplained muscle pain, tenderness, or weakness, especially if you also have a fever and tiredness. You need to follow a low-cholesterol, low-fat diet while you are taking this medicine. This will decrease your risk of getting heart and blood vessel disease. Exercising and avoiding alcohol and smoking can also help. Ask your doctor or dietician for advice. What side effects may I notice from receiving this medicine? Side effects that you should report to your doctor or health care professional as soon as possible: -allergic reactions like skin rash, itching or hives, swelling of the face, lips, or tongue -dark yellow or brown urine -unusually weak or tired -yellowing of the skin or eyes Side effects that usually do not require medical attention (report to your doctor or health care professional if they continue or are bothersome): -diarrhea -dizziness -headache -stomach upset or pain This list may not describe all possible side effects. Call your doctor for medical advice about side effects. You may report side effects to FDA at 1-800-FDA-1088. Where should I keep my medicine? Keep out of the reach of children. Store at room temperature between 15 and  30 degrees C (59 and 86 degrees F). Protect from moisture. Keep container tightly closed. Throw away any unused medicine after the expiration date. NOTE: This sheet is a summary. It may not cover all possible information. If you have questions about this medicine,  talk to your doctor, pharmacist, or health care provider.    2016, Elsevier/[employer] Standard. (2012-06-10 15:39:09)  Rotator Cuff Tendinitis Rotator cuff tendinitis is inflammation of the tough, cord-like bands that connect muscle to bone (tendons) in your rotator cuff. Your rotator cuff is the collection of all the muscles and tendons that connect your arm to your shoulder. Your rotator cuff holds the head of your upper arm bone (humerus) in the cup (fossa) of your shoulder blade (scapula). CAUSES Rotator cuff tendinitis is usually caused by overusing the joint involved.  SIGNS AND SYMPTOMS  Deep ache in the shoulder also felt on the outside upper arm over the shoulder muscle.  Point tenderness over the area that is injured.  Pain comes on gradually and becomes worse with lifting the arm to the side (abduction) or turning it inward (internal rotation).  May lead to a chronic tear: When a rotator cuff tendon becomes inflamed, it runs the risk of losing its blood supply, causing some tendon fibers to die. This increases the risk that the tendon can fray and partially or completely tear. DIAGNOSIS Rotator cuff tendinitis is diagnosed by taking a medical history, performing a physical exam, and reviewing results of imaging exams. The medical history is useful to help determine the type of rotator cuff injury. The physical exam will include looking at the injured shoulder, feeling the injured area, and watching you do range-of-motion exercises. X-ray exams are typically done to rule out other causes of shoulder pain, such as fractures. MRI is the imaging exam usually used for significant shoulder injuries. Sometimes a dye study called CT arthrogram is done, but it is not as widely used as MRI. In some institutions, special ultrasound tests may also be used to aid in the diagnosis. TREATMENT  Less Severe Cases  Use of a sling to rest the shoulder for a short period of time. Prolonged use of the sling  can cause stiffness, weakness, and loss of motion of the shoulder joint.  Anti-inflammatory medicines, such as ibuprofen or naproxen sodium, may be prescribed. More Severe Cases  Physical therapy.  Use of steroid injections into the shoulder joint.  Surgery. HOME CARE INSTRUCTIONS   Use a sling or splint until the pain decreases. Prolonged use of the sling can cause stiffness, weakness, and loss of motion of the shoulder joint.  Apply ice to the injured area:  Put ice in a plastic bag.  Place a towel between your skin and the bag.  Leave the ice on for 20 minutes, 2-3 times a day.  Try to avoid use other than gentle range of motion while your shoulder is painful. Use the shoulder and exercise only as directed by your health care provider. Stop exercises or range of motion if pain or discomfort increases, unless directed otherwise by your health care provider.  Only take over-the-counter or prescription medicines for pain, discomfort, or fever as directed by your health care provider.  If you were given a shoulder sling and straps (immobilizer), do not remove it except as directed, or until you see a health care provider for a follow-up exam. If you need to remove it, move your arm as little as possible or as directed.  You may want to  sleep on several pillows at night to lessen swelling and pain. SEEK IMMEDIATE MEDICAL CARE IF:   Your shoulder pain increases or new pain develops in your arm, hand, or fingers and is not relieved with medicines.  You have new, unexplained symptoms, especially increased numbness in the hands or loss of strength.  You develop any worsening of the problems that brought you in for care.  Your arm, hand, or fingers are numb or tingling.  Your arm, hand, or fingers are swollen, painful, or turn white or blue. MAKE SURE YOU:  Understand these instructions.  Will watch your condition.  Will get help right away if you are not doing well or get  worse.   This information is not intended to replace advice given to you by your health care provider. Make sure you discuss any questions you have with your health care provider.   Document Released: 02/24/2004 Document Revised: 12/25/2014 Document Reviewed: 07/16/2013 Elsevier Interactive Patient Education 2016 [employer]  Generic Shoulder Exercises EXERCISES  RANGE OF MOTION (ROM) AND STRETCHING EXERCISES These exercises may help you when beginning to rehabilitate your injury. Your symptoms may resolve with or without further involvement from your physician, physical therapist or athletic trainer. While completing these exercises, remember:   Restoring tissue flexibility helps normal motion to return to the joints. This allows healthier, less painful movement and activity.  An effective stretch should be held for at least 30 seconds.  A stretch should never be painful. You should only feel a gentle lengthening or release in the stretched tissue. ROM - Pendulum  Bend at the waist so that your right / left arm falls away from your body. Support yourself with your opposite hand on a solid surface, such as a table or a countertop.  Your right / left arm should be perpendicular to the ground. If it is not perpendicular, you need to lean over farther. Relax the muscles in your right / left arm and shoulder as much as possible.  Gently sway your hips and trunk so they move your right / left arm without any use of your right / left shoulder muscles.  Progress your movements so that your right / left arm moves side to side, then forward and backward, and finally, both clockwise and counterclockwise.  Complete __________ repetitions in each direction. Many people use this exercise to relieve discomfort in their shoulder as well as to gain range of motion. Repeat __________ times. Complete this exercise __________ times per day. STRETCH - Flexion, Standing  Stand with good posture. With an  underhand grip on your right / left hand and an overhand grip on the opposite hand, grasp a broomstick or cane so that your hands are a little more than shoulder-width apart.  Keeping your right / left elbow straight and shoulder muscles relaxed, push the stick with your opposite hand to raise your right / left arm in front of your body and then overhead. Raise your arm until you feel a stretch in your right / left shoulder, but before you have increased shoulder pain.  Try to avoid shrugging your right / left shoulder as your arm rises by keeping your shoulder blade tucked down and toward your mid-back spine. Hold __________ seconds.  Slowly return to the starting position. Repeat __________ times. Complete this exercise __________ times per day. STRETCH - Internal Rotation  Place your right / left hand behind your back, palm-up.  Throw a towel or belt over your opposite shoulder. Grasp  the towel/belt with your right / left hand.  While keeping an upright posture, gently pull up on the towel/belt until you feel a stretch in the front of your right / left shoulder.  Avoid shrugging your right / left shoulder as your arm rises by keeping your shoulder blade tucked down and toward your mid-back spine.  Hold __________. Release the stretch by lowering your opposite hand. Repeat __________ times. Complete this exercise __________ times per day. STRETCH - External Rotation and Abduction  Stagger your stance through a doorframe. It does not matter which foot is forward.  As instructed by your physician, physical therapist or athletic trainer, place your hands:  And forearms above your head and on the door frame.  And forearms at head-height and on the door frame.  At elbow-height and on the door frame.  Keeping your head and chest upright and your stomach muscles tight to prevent over-extending your low-back, slowly shift your weight onto your front foot until you feel a stretch across your  chest and/or in the front of your shoulders.  Hold __________ seconds. Shift your weight to your back foot to release the stretch. Repeat __________ times. Complete this stretch __________ times per day.  STRENGTHENING EXERCISES  These exercises may help you when beginning to rehabilitate your injury. They may resolve your symptoms with or without further involvement from your physician, physical therapist or athletic trainer. While completing these exercises, remember:   Muscles can gain both the endurance and the strength needed for everyday activities through controlled exercises.  Complete these exercises as instructed by your physician, physical therapist or athletic trainer. Progress the resistance and repetitions only as guided.  You may experience muscle soreness or fatigue, but the pain or discomfort you are trying to eliminate should never worsen during these exercises. If this pain does worsen, stop and make certain you are following the directions exactly. If the pain is still present after adjustments, discontinue the exercise until you can discuss the trouble with your clinician.  If advised by your physician, during your recovery, avoid activity or exercises which involve actions that place your right / left hand or elbow above your head or behind your back or head. These positions stress the tissues which are trying to heal. STRENGTH - Scapular Depression and Adduction  With good posture, sit on a firm chair. Supported your arms in front of you with pillows, arm rests or a table top. Have your elbows in line with the sides of your body.  Gently draw your shoulder blades down and toward your mid-back spine. Gradually increase the tension without tensing the muscles along the top of your shoulders and the back of your neck.  Hold for __________ seconds. Slowly release the tension and relax your muscles completely before completing the next repetition.  After you have practiced  this exercise, remove the arm support and complete it in standing as well as sitting. Repeat __________ times. Complete this exercise __________ times per day.  STRENGTH - External Rotators  Secure a rubber exercise band/tubing to a fixed object so that it is at the same height as your right / left elbow when you are standing or sitting on a firm surface.  Stand or sit so that the secured exercise band/tubing is at your side that is not injured.  Bend your elbow 90 degrees. Place a folded towel or small pillow under your right / left arm so that your elbow is a few inches away from your  side.  Keeping the tension on the exercise band/tubing, pull it away from your body, as if pivoting on your elbow. Be sure to keep your body steady so that the movement is only coming from your shoulder rotating.  Hold __________ seconds. Release the tension in a controlled manner as you return to the starting position. Repeat __________ times. Complete this exercise __________ times per day.  STRENGTH - Supraspinatus  Stand or sit with good posture. Grasp a __________ weight or an exercise band/tubing so that your hand is "thumbs-up," like when you shake hands.  Slowly lift your right / left hand from your thigh into the air, traveling about 30 degrees from straight out at your side. Lift your hand to shoulder height or as far as you can without increasing any shoulder pain. Initially, many people do not lift their hands above shoulder height.  Avoid shrugging your right / left shoulder as your arm rises by keeping your shoulder blade tucked down and toward your mid-back spine.  Hold for __________ seconds. Control the descent of your hand as you slowly return to your starting position. Repeat __________ times. Complete this exercise __________ times per day.  STRENGTH - Shoulder Extensors  Secure a rubber exercise band/tubing so that it is at the height of your shoulders when you are either standing or  sitting on a firm arm-less chair.  With a thumbs-up grip, grasp an end of the band/tubing in each hand. Straighten your elbows and lift your hands straight in front of you at shoulder height. Step back away from the secured end of band/tubing until it becomes tense.  Squeezing your shoulder blades together, pull your hands down to the sides of your thighs. Do not allow your hands to go behind you.  Hold for __________ seconds. Slowly ease the tension on the band/tubing as you reverse the directions and return to the starting position. Repeat __________ times. Complete this exercise __________ times per day.  STRENGTH - Scapular Retractors  Secure a rubber exercise band/tubing so that it is at the height of your shoulders when you are either standing or sitting on a firm arm-less chair.  With a palm-down grip, grasp an end of the band/tubing in each hand. Straighten your elbows and lift your hands straight in front of you at shoulder height. Step back away from the secured end of band/tubing until it becomes tense.  Squeezing your shoulder blades together, draw your elbows back as you bend them. Keep your upper arm lifted away from your body throughout the exercise.  Hold __________ seconds. Slowly ease the tension on the band/tubing as you reverse the directions and return to the starting position. Repeat __________ times. Complete this exercise __________ times per day. STRENGTH - Scapular Depressors  Find a sturdy chair without wheels, such as a from a dining room table.  Keeping your feet on the floor, lift your bottom from the seat and lock your elbows.  Keeping your elbows straight, allow gravity to pull your body weight down. Your shoulders will rise toward your ears.  Raise your body against gravity by drawing your shoulder blades down your back, shortening the distance between your shoulders and ears. Although your feet should always maintain contact with the floor, your feet should  progressively support less body weight as you get stronger.  Hold __________ seconds. In a controlled and slow manner, lower your body weight to begin the next repetition. Repeat __________ times. Complete this exercise __________ times per day.  This information is not intended to replace advice given to you by your health care provider. Make sure you discuss any questions you have with your health care provider.   Document Released: 10/18/2005 Document Revised: 12/25/2014 Document Reviewed: 03/18/2009 Elsevier Interactive Patient Education [employer].

## 2016-08-14 NOTE — Assessment & Plan Note (Signed)
Continue medications from the ER. Exercises given today. Will check back in in 2 weeks, if not better, will get her into see PT.

## 2016-08-14 NOTE — Progress Notes (Signed)
BP 129/76 (BP Location: Left Arm, Patient Position: Sitting, Cuff Size: Large)   Pulse (!) 54   Temp 98.1 F (36.7 C)   Ht 5' 4.6" (1.641 m)   Wt 169 lb (76.7 kg)   BMI 28.47 kg/m    Subjective:    Patient ID: Stacy Mercado, female    DOB: [birth year], 70 y.o.   MRN: [id number]  HPI: Stacy Mercado is a 70 y.o. female who presents today to establish care after moving here from [place] about a month ago.  Chief Complaint  Patient presents with  . Establish Care    Patient states that she does not have CKD  . ER Follow Up  . Referral    Eye doctor   ER FOLLOW UP Time since discharge: 3 days Hospital/facility: [facility] Diagnosis: Fall, shoulder pain, rib contusion Procedures/tests: labs, EKG, x-ray Consultants: none New medications: hydrocodone, naproxen, baclofen Discharge instructions: referral to ortho (Dr. Mack Guise)- has not seen him.  Status: stable   SHOULDER PAIN Duration: 3 days ago Involved shoulder: right Mechanism of injury: trauma Location: diffuse Onset:sudden Severity: 8/10  Quality:  catching Frequency: intermittent Radiation: yes- up into her neck Aggravating factors: lifting and movement  Alleviating factors: nothing  Status: stable Treatments attempted: pain medicine, rest, ice and aleve  Relief with NSAIDs?:  mild Weakness: yes Numbness: yes- occasionally Decreased grip strength: no Redness: no Swelling: no Bruising: no Fevers: no  Known bladder cancer. Had been seeing urology in [place]. Needs new urologist up here.   DIABETES Hypoglycemic episodes:no Polydipsia/polyuria: no Visual disturbance: no Chest pain: no Paresthesias: no Glucose Monitoring: yes  Accucheck frequency: Not Checking Taking Insulin?: no Blood Pressure Monitoring: not checking Retinal Examination: Up to Date Foot Exam: Up to Date Diabetic Education: Completed Pneumovax: Up to Date Influenza: post-pone to flu season Aspirin: yes  HYPERTENSION /  HYPERLIPIDEMIA Satisfied with current treatment? yes Duration of hypertension: chronic BP monitoring frequency: not checking BP medication side effects: no Duration of hyperlipidemia: chronic Cholesterol medication side effects: yes Cholesterol supplements: none Past cholesterol medications: Several Medication compliance: excellent compliance Aspirin: no Recent stressors: no Recurrent headaches: no Visual changes: no Palpitations: no Dyspnea: no Chest pain: no Lower extremity edema: no Dizzy/lightheaded: no  Active Ambulatory Problems    Diagnosis Date Noted  . CKD (chronic kidney disease)   . Bladder cancer ([place])   . 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 Depression   . Benign hypertensive renal disease   . PVD (peripheral vascular disease) ([place])   . Right kidney mass 08/07/2016  . Hyperlipidemia 08/07/2016  . Urge incontinence of urine 08/07/2016  . 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 Cataracts, bilateral 08/14/2016  . Calcific tendonitis of right shoulder 08/14/2016   Resolved Ambulatory Problems    Diagnosis Date Noted  . Diabetes mellitus without complication ([place])   . Hypertension    Past Medical History:  Diagnosis Date  . Benign hypertensive renal disease   . Bladder cancer ([place])   . Chronic low back pain   . CKD (chronic kidney disease)   . Depression   . Hyperlipidemia 08/07/2016  . PVD (peripheral vascular disease) ([place])   . 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 Urge incontinence of urine 08/07/2016   Allergies  Allergen Reactions  . Statins Other (See Comments)    myalgia   Past Surgical History:  Procedure Laterality Date  . APPENDECTOMY  1961  . bladder cancer removed  2014  . complete hysterectomy  1999  . DILATION AND CURETTAGE OF UTERUS  1999  . L rotator cuff Left 2010  . R thumb ruptured ligament and tendon  2012   Social History   Social History  . Marital status: Single    Spouse name: N/A  . Number of  children: N/A  . Years of education: N/A   Occupational History  . Not on file.   Social History Main Topics  . Smoking status: Former Smoker    Years: 50.00    Types: Cigarettes  . Smokeless tobacco: Never Used  . Alcohol use 4.2 oz/week    7 Shots of liquor per week     Comment: When she is not on any medications  . Drug use: No  . Sexual activity: Not on file   Other Topics Concern  . Not on file   Social History Narrative  . No narrative on file   Family History  Problem Relation Age of Onset  . Hypertension Brother   . Diabetes Brother   . Healthy Son   . Healthy Son   . Healthy Daughter    Review of Systems  Constitutional: Negative.   Respiratory: Negative.   Cardiovascular: Negative.   Psychiatric/Behavioral: Negative.     Per HPI unless specifically indicated above     Objective:    BP 129/76 (BP Location: Left Arm, Patient Position: Sitting, Cuff Size: Large)   Pulse (!) 54   Temp 98.1 F (36.7 C)   Ht 5' 4.6" (1.641 m)   Wt 169 lb (76.7 kg)   BMI 28.47 kg/m   Wt Readings from Last 3 Encounters:  08/14/16 169 lb (76.7 kg)  08/11/16 162 lb (73.5 kg)  07/26/16 162 lb (73.5 kg)    Physical Exam  Constitutional: She is oriented to person, place, and time. She appears well-developed and well-nourished. No distress.  HENT:  Head: Normocephalic and atraumatic.  Right Ear: Hearing normal.  Left Ear: Hearing normal.  Nose: Nose normal.  Eyes: Conjunctivae and lids are normal. Right eye exhibits no discharge. Left eye exhibits no discharge. No scleral icterus.  Cardiovascular: Normal rate, regular rhythm, normal heart sounds and intact distal pulses.  Exam reveals no gallop and no friction rub.   No murmur heard. Pulmonary/Chest: Effort normal and breath sounds normal. No respiratory distress. She has no wheezes. She has no rales. She exhibits no tenderness.  Musculoskeletal: Normal range of motion.  Neurological: She is alert and oriented to  person, place, and time.  Skin: Skin is warm, dry and intact. No rash noted. No erythema. No pallor.  Psychiatric: She has a normal mood and affect. Her speech is normal and behavior is normal. Judgment and thought content normal. Cognition and memory are normal.  Nursing note and vitals reviewed.   Results for orders placed or performed in visit on 08/14/16  Microscopic Examination  Result Value Ref Range   WBC, UA 0-5 0 - 5 /hpf   RBC, UA 0-2 0 - 2 /hpf   Epithelial Cells (non renal) 0-10 0 - 10 /hpf   Crystals Present (A) N/A   Crystal Type Calcium Oxalate N/A   Bacteria, UA Few None seen/Few  Bayer DCA Hb A1c Waived  Result Value Ref Range   Bayer DCA Hb A1c Waived 6.0 <7.0 %  Lipid Panel Piccolo, Waived  Result Value Ref Range   Cholesterol Piccolo, Waived 211 (H) <200 mg/dL   HDL Chol Piccolo, Waived 58 >59  mg/dL   Triglycerides Piccolo,Waived 226 (H) <150 mg/dL   Chol/HDL Ratio Piccolo,Waive 3.6 mg/dL   LDL Chol Calc Piccolo Waived 108 (H) <100 mg/dL   VLDL Chol Calc Piccolo,Waive 45 (H) <30 mg/dL  Microalbumin, Urine Waived  Result Value Ref Range   Microalb, Ur Waived 10 0 - 19 mg/L   Creatinine, Urine Waived 100 10 - 300 mg/dL   Microalb/Creat Ratio <30 <30 mg/g  UA/M w/rflx Culture, Routine  Result Value Ref Range   Specific Gravity, UA 1.015 1.005 - 1.030   pH, UA 7.0 5.0 - 7.5   Color, UA Yellow Yellow   Appearance Ur Clear Clear   Leukocytes, UA 1+ (A) Negative   Protein, UA Negative Negative/Trace   Glucose, UA Negative Negative   Ketones, UA Negative Negative   RBC, UA Negative Negative   Bilirubin, UA Negative Negative   Urobilinogen, Ur 0.2 0.2 - 1.0 mg/dL   Nitrite, UA Negative Negative   Microscopic Examination See below:    Urinalysis Reflex Comment   Urine Culture, Routine  Result Value Ref Range   Urine Culture, Routine WILL FOLLOW       Assessment & Plan:   Problem List Items Addressed This Visit      Cardiovascular and Mediastinum    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 A1c under good control. OK to check every 6 months. Call with concerns.       Relevant Medications   losartan (COZAAR) 50 MG tablet   metFORMIN (GLUCOPHAGE) 1000 MG tablet   Other Relevant Orders   Bayer DCA Hb A1c Waived (Completed)   Comprehensive metabolic panel   Microalbumin, Urine Waived (Completed)   UA/M w/rflx Culture, Routine (Completed)     Musculoskeletal and Integument   Calcific tendonitis of right shoulder    Continue medications from the ER. Exercises given today. Will check back in in 2 weeks, if not better, will get her into see PT.         Genitourinary   CKD (chronic kidney disease)   Relevant Orders   Comprehensive metabolic panel   Bladder cancer [facility])    Referral to urology made today. Will see them ASAP.      Relevant Orders   CBC with Differential/Platelet   Comprehensive metabolic panel   UA/M w/rflx Culture, Routine (Completed)   Ambulatory referral to Urology   Benign hypertensive renal disease    Under good control. Continue current regimen. Continue to monitor.       Relevant Orders   Comprehensive metabolic panel   Microalbumin, Urine Waived (Completed)     Other   Depression    Stable on Paxil. Refill given today.       Relevant Medications   PARoxetine (PAXIL) 20 MG tablet   Other Relevant Orders   Comprehensive metabolic panel   TSH   Hyperlipidemia    Statin intolerant. Will consider zetia. Information provided.       Relevant Medications   losartan (COZAAR) 50 MG tablet   Other Relevant Orders   Comprehensive metabolic panel   Lipid Panel Piccolo, Waived (Completed)   Urge incontinence of urine    Was on oxybutinin earlier. Getting in to see urology ASAP.      Cataracts, bilateral    Needs referral to opthalmology. 1 side treated with "laser injection". Referral generated today.      Relevant Orders   Ambulatory referral to Ophthalmology  Other Visit Diagnoses    Screening for STD (sexually transmitted disease)       Checking labs today. Await results.    Relevant Orders   Hepatitis C antibody   Comprehensive metabolic panel       Follow up plan: Return 2 weeks, for follow up shoulder.

## 2016-08-14 NOTE — Assessment & Plan Note (Signed)
Under good control. Continue current regimen. Continue to monitor.  

## 2016-08-14 NOTE — Assessment & Plan Note (Signed)
Statin intolerant. Will consider zetia. Information provided.

## 2016-08-14 NOTE — Assessment & Plan Note (Signed)
Stable on Paxil. Refill given today.

## 2016-08-14 NOTE — Assessment & Plan Note (Signed)
Checking labs. A1c under good control. OK to check every 6 months. Call with concerns.

## 2016-08-14 NOTE — Assessment & Plan Note (Signed)
Needs referral to opthalmology. 1 side treated with "laser injection". Referral generated today.

## 2016-08-15 ENCOUNTER — Encounter: Payer: Self-pay | Admitting: Family Medicine

## 2016-08-15 ENCOUNTER — Ambulatory Visit: Payer: Medicare Other | Admitting: Urology

## 2016-08-15 ENCOUNTER — Telehealth: Payer: Self-pay | Admitting: Family Medicine

## 2016-08-15 DIAGNOSIS — R7989 Other specified abnormal findings of blood chemistry: Secondary | ICD-10-CM

## 2016-08-15 LAB — COMPREHENSIVE METABOLIC PANEL
A/G RATIO: 1.8 (ref 1.2–2.2)
ALT: 18 IU/L (ref 0–32)
AST: 21 IU/L (ref 0–40)
Albumin: 3.8 g/dL (ref 3.5–4.8)
Alkaline Phosphatase: 66 IU/L (ref 39–117)
BUN/Creatinine Ratio: 19 (ref 12–28)
BUN: 13 mg/dL (ref 8–27)
Bilirubin Total: 0.4 mg/dL (ref 0.0–1.2)
CALCIUM: 10.3 mg/dL (ref 8.7–10.3)
CO2: 28 mmol/L (ref 18–29)
CREATININE: 0.7 mg/dL (ref 0.57–1.00)
Chloride: 103 mmol/L (ref 96–106)
GFR, EST AFRICAN AMERICAN: 101 mL/min/{1.73_m2} (ref >59)
GFR, EST NON AFRICAN AMERICAN: 87 mL/min/{1.73_m2} (ref >59)
Globulin, Total: 2.1 g/dL (ref 1.5–4.5)
Glucose: 93 mg/dL (ref 65–99)
Potassium: 4.9 mmol/L (ref 3.5–5.2)
Sodium: 143 mmol/L (ref 134–144)
TOTAL PROTEIN: 5.9 g/dL — AB (ref 6.0–8.5)

## 2016-08-15 LAB — CBC WITH DIFFERENTIAL/PLATELET
BASOS: 0 %
Basophils Absolute: 0 10*3/uL (ref 0.0–0.2)
EOS (ABSOLUTE): 0.2 10*3/uL (ref 0.0–0.4)
EOS: 3 %
Hematocrit: 37.6 % (ref 34.0–46.6)
Hemoglobin: 12 g/dL (ref 11.1–15.9)
IMMATURE GRANS (ABS): 0 10*3/uL (ref 0.0–0.1)
IMMATURE GRANULOCYTES: 0 %
LYMPHS: 35 %
Lymphocytes Absolute: 1.7 10*3/uL (ref 0.7–3.1)
MCH: 29.7 pg (ref 26.6–33.0)
MCHC: 31.9 g/dL (ref 31.5–35.7)
MCV: 93 fL (ref 79–97)
Monocytes Absolute: 0.5 10*3/uL (ref 0.1–0.9)
Monocytes: 11 %
NEUTROS PCT: 51 %
Neutrophils Absolute: 2.5 10*3/uL (ref 1.4–7.0)
PLATELETS: 205 10*3/uL (ref 150–379)
RBC: 4.04 x10E6/uL (ref 3.77–5.28)
RDW: 13.2 % (ref 12.3–15.4)
WBC: 4.8 10*3/uL (ref 3.4–10.8)

## 2016-08-15 LAB — TSH: TSH: 5.19 u[IU]/mL — ABNORMAL HIGH (ref 0.450–4.500)

## 2016-08-15 LAB — HEPATITIS C ANTIBODY: Hep C Virus Ab: <0.1 s/co ratio (ref 0.0–0.9)

## 2016-08-15 NOTE — Telephone Encounter (Signed)
Called and let patient know that her labs were normal but her thyroid was slightly off. Will check again in 2 months. She is aware and will schedule lab visit at follow up for shoulder in 2 weeks.

## 2016-08-16 LAB — UA/M W/RFLX CULTURE, ROUTINE
Bilirubin, UA: NEGATIVE
Glucose, UA: NEGATIVE
Ketones, UA: NEGATIVE
NITRITE UA: NEGATIVE
PH UA: 7 (ref 5.0–7.5)
PROTEIN UA: NEGATIVE
RBC UA: NEGATIVE
Specific Gravity, UA: 1.015 (ref 1.005–1.030)
UUROB: 0.2 mg/dL (ref 0.2–1.0)

## 2016-08-16 LAB — URINE CULTURE, REFLEX

## 2016-08-16 LAB — MICROALBUMIN, URINE WAIVED
Creatinine, Urine Waived: 100 mg/dL (ref 10–300)
Microalb, Ur Waived: 10 mg/L (ref 0–19)

## 2016-08-16 LAB — LIPID PANEL PICCOLO, WAIVED
CHOL/HDL RATIO PICCOLO,WAIVE: 3.6 mg/dL
CHOLESTEROL PICCOLO, WAIVED: 211 mg/dL — AB (ref <200)
HDL CHOL PICCOLO, WAIVED: 58 mg/dL (ref >59)
LDL CHOL CALC PICCOLO WAIVED: 108 mg/dL — AB (ref <100)
TRIGLYCERIDES PICCOLO,WAIVED: 226 mg/dL — AB (ref <150)
VLDL Chol Calc Piccolo,Waive: 45 mg/dL — ABNORMAL HIGH (ref <30)

## 2016-08-16 LAB — MICROSCOPIC EXAMINATION

## 2016-08-16 LAB — BAYER DCA HB A1C WAIVED: HB A1C (BAYER DCA - WAIVED): 6 % (ref <7.0)

## 2016-08-25 ENCOUNTER — Encounter: Payer: Self-pay | Admitting: Urology

## 2016-08-25 ENCOUNTER — Encounter (INDEPENDENT_AMBULATORY_CARE_PROVIDER_SITE_OTHER): Payer: Medicare Other | Admitting: Urology

## 2016-08-25 DIAGNOSIS — I129 Hypertensive chronic kidney disease with stage 1 through stage 4 chronic kidney disease, or unspecified chronic kidney disease: Secondary | ICD-10-CM | POA: Diagnosis not present

## 2016-08-25 LAB — URINALYSIS, COMPLETE
BILIRUBIN UA: NEGATIVE
GLUCOSE, UA: NEGATIVE
KETONES UA: NEGATIVE
LEUKOCYTES UA: NEGATIVE
Nitrite, UA: NEGATIVE
Protein, UA: NEGATIVE
SPEC GRAV UA: 1.025 (ref 1.005–1.030)
Urobilinogen, Ur: 1 mg/dL (ref 0.2–1.0)
pH, UA: 6 (ref 5.0–7.5)

## 2016-08-25 LAB — MICROSCOPIC EXAMINATION
Bacteria, UA: NONE SEEN
RBC, UA: >30 /hpf — AB (ref 0–?)
WBC, UA: NONE SEEN /hpf (ref 0–?)

## 2016-08-28 ENCOUNTER — Encounter: Payer: Self-pay | Admitting: Family Medicine

## 2016-08-28 ENCOUNTER — Ambulatory Visit (INDEPENDENT_AMBULATORY_CARE_PROVIDER_SITE_OTHER): Payer: Medicare Other | Admitting: Family Medicine

## 2016-08-28 VITALS — BP 137/82 | HR 60 | Temp 97.9°F | Wt 168.0 lb

## 2016-08-28 DIAGNOSIS — M7531 Calcific tendinitis of right shoulder: Secondary | ICD-10-CM

## 2016-08-28 DIAGNOSIS — Z23 Encounter for immunization: Secondary | ICD-10-CM

## 2016-08-28 MED ORDER — TRAMADOL HCL 50 MG PO TABS: 50.0000 mg | ORAL_TABLET | Freq: Every day | ORAL | 0 refills | Status: DC | PRN

## 2016-08-28 MED ORDER — NAPROXEN 500 MG PO TBEC: 500.0000 mg | DELAYED_RELEASE_TABLET | Freq: Two times a day (BID) | ORAL | 0 refills | Status: DC

## 2016-08-28 MED ORDER — BACLOFEN 10 MG PO TABS: 10.0000 mg | ORAL_TABLET | Freq: Two times a day (BID) | ORAL | 0 refills | Status: DC

## 2016-08-28 MED ORDER — NAPROXEN 500 MG PO TBEC: 500.0000 mg | DELAYED_RELEASE_TABLET | Freq: Two times a day (BID) | ORAL | 3 refills | Status: DC

## 2016-08-28 NOTE — Assessment & Plan Note (Addendum)
Worse and starting to freeze. Referral to PT given today. Refills of medication given today. Recheck 1 month. She will let us know if doing worse.

## 2016-08-28 NOTE — Progress Notes (Signed)
BP 137/82 (BP Location: Left Arm, Patient Position: Sitting, Cuff Size: Normal)   Pulse 60   Temp 97.9 F (36.6 C)   Wt 168 lb (76.2 kg)   SpO2 98%   BMI 28.39 kg/m    Subjective:    Patient ID: Stacy Mercado, female    DOB: [birth year], 70 y.o.   MRN: [id number]  HPI: Stacy Mercado is a 70 y.o. female  Chief Complaint  Patient presents with  . Shoulder Pain   SHOULDER PAIN- pain medicine is helping, but not great Duration: 17 days ago Involved shoulder: right Mechanism of injury: trauma Location: diffuse, mainly in the front Onset:sudden Severity: 8/10  Quality:  catching Frequency: constant Radiation: yes- up into her neck Aggravating factors: lifting and movement  Alleviating factors: nothing  Status: stable Treatments attempted: pain medicine, rest, ice and aleve  Relief with NSAIDs?:  mild Weakness: yes Numbness: yes- occasionally Decreased grip strength: no Redness: no Swelling: no Bruising: no Fevers: no  Relevant past medical, surgical, family and social history reviewed and updated as indicated. Interim medical history since our last visit reviewed. Allergies and medications reviewed and updated.  Review of Systems  Constitutional: Negative.   Respiratory: Negative.   Cardiovascular: Negative.     Per HPI unless specifically indicated above     Objective:    BP 137/82 (BP Location: Left Arm, Patient Position: Sitting, Cuff Size: Normal)   Pulse 60   Temp 97.9 F (36.6 C)   Wt 168 lb (76.2 kg)   SpO2 98%   BMI 28.39 kg/m   Wt Readings from Last 3 Encounters:  08/28/16 168 lb (76.2 kg)  08/25/16 166 lb 8 oz (75.5 kg)  08/14/16 169 lb (76.7 kg)    Physical Exam  Constitutional: She is oriented to person, place, and time. She appears well-developed and well-nourished. No distress.  HENT:  Head: Normocephalic and atraumatic.  Right Ear: Hearing normal.  Left Ear: Hearing normal.  Nose: Nose normal.  Eyes: Conjunctivae and lids are  normal. Right eye exhibits no discharge. Left eye exhibits no discharge. No scleral icterus.  Cardiovascular: Normal rate, regular rhythm, normal heart sounds and intact distal pulses.  Exam reveals no gallop and no friction rub.   No murmur heard. Pulmonary/Chest: Effort normal and breath sounds normal. No respiratory distress. She has no wheezes. She has no rales. She exhibits no tenderness.  Musculoskeletal: She exhibits tenderness. She exhibits no deformity.  Decreased Active ROM of R shoulder, tender to palpation of anterior joint line  Neurological: She is alert and oriented to person, place, and time.  Skin: Skin is warm, dry and intact. No rash noted. No erythema. No pallor.  Psychiatric: She has a normal mood and affect. Her speech is normal and behavior is normal. Judgment and thought content normal. Cognition and memory are normal.  Nursing note and vitals reviewed.   Results for orders placed or performed in visit on 08/25/16  Microscopic Examination  Result Value Ref Range   WBC, UA None seen 0 - 5 /hpf   RBC, UA >30 (A) 0 - 2 /hpf   Epithelial Cells (non renal) 0-10 0 - 10 /hpf   Casts Present (A) None seen /lpf   Cast Type Granular casts (A) N/A   Bacteria, UA None seen None seen/Few  Urinalysis, Complete  Result Value Ref Range   Specific Gravity, UA 1.025 1.005 - 1.030   pH, UA 6.0 5.0 - 7.5   Color, UA Yellow Yellow  Appearance Ur Hazy (A) Clear   Leukocytes, UA Negative Negative   Protein, UA Negative Negative/Trace   Glucose, UA Negative Negative   Ketones, UA Negative Negative   RBC, UA 1+ (A) Negative   Bilirubin, UA Negative Negative   Urobilinogen, Ur 1.0 0.2 - 1.0 mg/dL   Nitrite, UA Negative Negative   Microscopic Examination See below:       Assessment & Plan:   Problem List Items Addressed This Visit      Musculoskeletal and Integument   Calcific tendonitis of right shoulder - Primary    Worse and starting to freeze. Referral to PT given today.  Refills of medication given today. Recheck 1 month. She will let us know if doing worse.        Other Visit Diagnoses    Immunization due       Flu shot given today- will check on other vaccine records.    Relevant Orders   Flu vaccine HIGH DOSE PF (Fluzone High dose) (Completed)       Follow up plan: Return in about 4 weeks (around 09/25/2016) for Follow up shoulder with PT.

## 2016-09-11 DIAGNOSIS — M7580 Other shoulder lesions, unspecified shoulder: Secondary | ICD-10-CM | POA: Diagnosis not present

## 2016-09-11 DIAGNOSIS — M25511 Pain in right shoulder: Secondary | ICD-10-CM | POA: Diagnosis not present

## 2016-09-12 DIAGNOSIS — E119 Type 2 diabetes mellitus without complications: Secondary | ICD-10-CM | POA: Diagnosis not present

## 2016-09-12 LAB — HM DIABETES EYE EXAM

## 2016-09-12 NOTE — Progress Notes (Signed)
Patient rescheduled follow up for next month.  Awaiting records. This note was opened in error.    Hollice Espy, MD   This encounter was created in error - please disregard.

## 2016-09-14 DIAGNOSIS — M7580 Other shoulder lesions, unspecified shoulder: Secondary | ICD-10-CM | POA: Diagnosis not present

## 2016-09-14 DIAGNOSIS — M25511 Pain in right shoulder: Secondary | ICD-10-CM | POA: Diagnosis not present

## 2016-09-18 DIAGNOSIS — M7580 Other shoulder lesions, unspecified shoulder: Secondary | ICD-10-CM | POA: Diagnosis not present

## 2016-09-18 DIAGNOSIS — M25511 Pain in right shoulder: Secondary | ICD-10-CM | POA: Diagnosis not present

## 2016-09-21 DIAGNOSIS — M25511 Pain in right shoulder: Secondary | ICD-10-CM | POA: Diagnosis not present

## 2016-09-21 DIAGNOSIS — M7580 Other shoulder lesions, unspecified shoulder: Secondary | ICD-10-CM | POA: Diagnosis not present

## 2016-09-22 ENCOUNTER — Ambulatory Visit: Payer: Medicare Other | Admitting: Urology

## 2016-09-25 DIAGNOSIS — M25511 Pain in right shoulder: Secondary | ICD-10-CM | POA: Diagnosis not present

## 2016-09-25 DIAGNOSIS — M7580 Other shoulder lesions, unspecified shoulder: Secondary | ICD-10-CM | POA: Diagnosis not present

## 2016-09-27 ENCOUNTER — Ambulatory Visit (INDEPENDENT_AMBULATORY_CARE_PROVIDER_SITE_OTHER): Payer: Medicare Other | Admitting: Family Medicine

## 2016-09-27 ENCOUNTER — Encounter: Payer: Self-pay | Admitting: Family Medicine

## 2016-09-27 VITALS — BP 120/78 | HR 74 | Temp 98.3°F | Wt 165.4 lb

## 2016-09-27 DIAGNOSIS — Z23 Encounter for immunization: Secondary | ICD-10-CM

## 2016-09-27 DIAGNOSIS — F3341 Major depressive disorder, recurrent, in partial remission: Secondary | ICD-10-CM

## 2016-09-27 DIAGNOSIS — M7531 Calcific tendinitis of right shoulder: Secondary | ICD-10-CM

## 2016-09-27 MED ORDER — ESCITALOPRAM OXALATE 10 MG PO TABS: 10.0000 mg | ORAL_TABLET | Freq: Every day | ORAL | 3 refills | Status: DC

## 2016-09-27 MED ORDER — TRAMADOL HCL 50 MG PO TABS: 50.0000 mg | ORAL_TABLET | Freq: Every day | ORAL | 0 refills | Status: DC | PRN

## 2016-09-27 MED ORDER — MELOXICAM 15 MG PO TABS
15.0000 mg | ORAL_TABLET | Freq: Every day | ORAL | 2 refills | Status: DC
Start: 2016-09-27 — End: 2016-10-18

## 2016-09-27 NOTE — Progress Notes (Signed)
BP 120/78 (BP Location: Left Arm, Patient Position: Sitting, Cuff Size: Normal)   Pulse 74   Temp 98.3 F (36.8 C)   Wt 165 lb 6.4 oz (75 kg)   SpO2 97%   BMI 27.95 kg/m    Subjective:    Patient ID: Stacy Mercado, female    DOB: Apr 26, 1946, 70 y.o.   MRN: [id number]  HPI: Stacy Mercado is a 70 y.o. female  Chief Complaint  Patient presents with  . Shoulder Pain   SHOULDER PAIN- doing better. PT is really helping.  Duration: About a month and a half Involved shoulder:right Mechanism of injury:trauma Location: diffuse, mainly in the front Onset:sudden Severity: 8/10 Quality:catching Frequency: constant Radiation: yes- up into her neck Aggravating factors: lifting and movement Alleviating factors: nothing Status: stable Treatments attempted: pain medicine, rest, ice and aleve Relief with NSAIDs?: mild Weakness: yes Numbness: yes- occasionally Decreased grip strength: no Redness: no Swelling: no Bruising: no Fevers: no  Having some bad diarrhea with her naproxen.   WEIGHT GAIN Duration: chronic Previous attempts at weight loss: yes Complications of obesity: DM Peak weight: 196 Weight loss goal: below 160 Weight loss to date: 3 lbs (>30 over past several years) Requesting obesity pharmacotherapy: no Current weight loss supplements/medications: no Previous weight loss supplements/meds: no  Relevant past medical, surgical, family and social history reviewed and updated as indicated. Interim medical history since our last visit reviewed. Allergies and medications reviewed and updated.  Review of Systems  Constitutional: Negative.   Respiratory: Negative.   Cardiovascular: Negative.   Musculoskeletal: Positive for arthralgias, joint swelling and myalgias. Negative for back pain, gait problem, neck pain and neck stiffness.  Neurological: Negative.   Psychiatric/Behavioral: Negative.     Per HPI unless specifically indicated above       Objective:    BP 120/78 (BP Location: Left Arm, Patient Position: Sitting, Cuff Size: Normal)   Pulse 74   Temp 98.3 F (36.8 C)   Wt 165 lb 6.4 oz (75 kg)   SpO2 97%   BMI 27.95 kg/m   Wt Readings from Last 3 Encounters:  09/27/16 165 lb 6.4 oz (75 kg)  08/28/16 168 lb (76.2 kg)  08/25/16 166 lb 8 oz (75.5 kg)    Physical Exam  Constitutional: She is oriented to person, place, and time. She appears well-developed and well-nourished. No distress.  HENT:  Head: Normocephalic and atraumatic.  Right Ear: Hearing normal.  Left Ear: Hearing normal.  Nose: Nose normal.  Eyes: Conjunctivae and lids are normal. Right eye exhibits no discharge. Left eye exhibits no discharge. No scleral icterus.  Cardiovascular: Normal rate, regular rhythm, normal heart sounds and intact distal pulses.  Exam reveals no gallop and no friction rub.   No murmur heard. Pulmonary/Chest: Effort normal and breath sounds normal. No respiratory distress. She has no wheezes. She has no rales. She exhibits no tenderness.  Musculoskeletal: Normal range of motion.  Neurological: She is alert and oriented to person, place, and time.  Skin: Skin is warm, dry and intact. No rash noted. No erythema. No pallor.  Psychiatric: She has a normal mood and affect. Her speech is normal and behavior is normal. Judgment and thought content normal. Cognition and memory are normal.  Nursing note and vitals reviewed.   Results for orders placed or performed in visit on 09/14/16  HM DIABETES EYE EXAM  Result Value Ref Range   HM Diabetic Eye Exam No Retinopathy No Retinopathy      Assessment &  Plan:   Problem List Items Addressed This Visit      Musculoskeletal and Integument   Calcific tendonitis of right shoulder - Primary     Other   Depression    Concern about weight gain on the paxil. Will change to lexapro and recheck in 3 weeks to see how she's doing.       Relevant Medications   escitalopram (LEXAPRO) 10 MG  tablet    Other Visit Diagnoses    Immunization due       Prevnar given today.       Follow up plan: Return in about 3 weeks (around 10/18/2016) for Follow up shoulder and mood.

## 2016-09-27 NOTE — Patient Instructions (Signed)
Pneumococcal Vaccine, Polyvalent suspension for injection  What is this medicine?  PNEUMOCOCCAL VACCINE (NEU mo KOK al vak SEEN) is a vaccine used to prevent pneumococcus bacterial infections. These bacteria can cause serious infections like pneumonia, meningitis, and blood infections. This vaccine will lower your chance of getting pneumonia. If you do get pneumonia, it can make your symptoms milder and your illness shorter. This vaccine will not treat an infection and will not cause infection. This vaccine is recommended for infants and young children, adults with certain medical conditions, and adults 65 years or older.  This medicine may be used for other purposes; ask your health care provider or pharmacist if you have questions.  What should I tell my health care provider before I take this medicine?  They need to know if you have any of these conditions:  -bleeding problems  -fever  -immune system problems  -an unusual or allergic reaction to pneumococcal vaccine, diphtheria toxoid, other vaccines, latex, other medicines, foods, dyes, or preservatives  -pregnant or trying to get pregnant  -breast-feeding  How should I use this medicine?  This vaccine is for injection into a muscle. It is given by a health care professional.  A copy of Vaccine Information Statements will be given before each vaccination. Read this sheet carefully each time. The sheet may change frequently.  Talk to your pediatrician regarding the use of this medicine in children. While this drug may be prescribed for children as young as 6 weeks old for selected conditions, precautions do apply.  Overdosage: If you think you have taken too much of this medicine contact a poison control center or emergency room at once.  NOTE: This medicine is only for you. Do not share this medicine with others.  What if I miss a dose?  It is important not to miss your dose. Call your doctor or health care professional if you are unable to keep an  appointment.  What may interact with this medicine?  -medicines for cancer chemotherapy  -medicines that suppress your immune function  -steroid medicines like prednisone or cortisone  This list may not describe all possible interactions. Give your health care provider a list of all the medicines, herbs, non-prescription drugs, or dietary supplements you use. Also tell them if you smoke, drink alcohol, or use illegal drugs. Some items may interact with your medicine.  What should I watch for while using this medicine?  Mild fever and pain should go away in 3 days or less. Report any unusual symptoms to your doctor or health care professional.  What side effects may I notice from receiving this medicine?  Side effects that you should report to your doctor or health care professional as soon as possible:  -allergic reactions like skin rash, itching or hives, swelling of the face, lips, or tongue  -breathing problems  -confused  -fast or irregular heartbeat  -fever over 102 degrees F  -seizures  -unusual bleeding or bruising  -unusual muscle weakness  Side effects that usually do not require medical attention (report to your doctor or health care professional if they continue or are bothersome):  -aches and pains  -diarrhea  -fever of 102 degrees F or less  -headache  -irritable  -loss of appetite  -pain, tender at site where injected  -trouble sleeping  This list may not describe all possible side effects. Call your doctor for medical advice about side effects. You may report side effects to FDA at 1-800-FDA-1088.  Where should I keep   my medicine?  This does not apply. This vaccine is given in a clinic, pharmacy, doctor's office, or other health care setting and will not be stored at home.  NOTE: This sheet is a summary. It may not cover all possible information. If you have questions about this medicine, talk to your doctor, pharmacist, or health care provider.     © 2016, [employer]. (2014-09-10  10:27:27)

## 2016-09-27 NOTE — Assessment & Plan Note (Signed)
Concern about weight gain on the paxil. Will change to lexapro and recheck in 3 weeks to see how she's doing.

## 2016-09-28 ENCOUNTER — Other Ambulatory Visit: Payer: Self-pay

## 2016-09-28 DIAGNOSIS — C679 Malignant neoplasm of bladder, unspecified: Secondary | ICD-10-CM

## 2016-09-28 DIAGNOSIS — M25511 Pain in right shoulder: Secondary | ICD-10-CM | POA: Diagnosis not present

## 2016-09-28 DIAGNOSIS — M7580 Other shoulder lesions, unspecified shoulder: Secondary | ICD-10-CM | POA: Diagnosis not present

## 2016-09-29 ENCOUNTER — Ambulatory Visit: Payer: Medicare Other | Admitting: Urology

## 2016-10-05 DIAGNOSIS — M7580 Other shoulder lesions, unspecified shoulder: Secondary | ICD-10-CM | POA: Diagnosis not present

## 2016-10-05 DIAGNOSIS — M25511 Pain in right shoulder: Secondary | ICD-10-CM | POA: Diagnosis not present

## 2016-10-11 DIAGNOSIS — M25511 Pain in right shoulder: Secondary | ICD-10-CM | POA: Diagnosis not present

## 2016-10-11 DIAGNOSIS — M7580 Other shoulder lesions, unspecified shoulder: Secondary | ICD-10-CM | POA: Diagnosis not present

## 2016-10-15 ENCOUNTER — Emergency Department
Admission: EM | Admit: 2016-10-15 | Discharge: 2016-10-15 | Disposition: A | Discharge status: Home / Self Care | Payer: Medicare Other | Attending: Emergency Medicine | Admitting: Emergency Medicine

## 2016-10-15 ENCOUNTER — Encounter: Payer: Self-pay | Admitting: Emergency Medicine

## 2016-10-15 ENCOUNTER — Emergency Department: Payer: Medicare Other

## 2016-10-15 DIAGNOSIS — S40012A Contusion of left shoulder, initial encounter: Secondary | ICD-10-CM | POA: Insufficient documentation

## 2016-10-15 DIAGNOSIS — S60222A Contusion of left hand, initial encounter: Secondary | ICD-10-CM

## 2016-10-15 DIAGNOSIS — S60221A Contusion of right hand, initial encounter: Secondary | ICD-10-CM | POA: Insufficient documentation

## 2016-10-15 DIAGNOSIS — Z8551 Personal history of malignant neoplasm of bladder: Secondary | ICD-10-CM | POA: Diagnosis not present

## 2016-10-15 DIAGNOSIS — R079 Chest pain, unspecified: Secondary | ICD-10-CM | POA: Diagnosis not present

## 2016-10-15 DIAGNOSIS — Y9389 Activity, other specified: Secondary | ICD-10-CM | POA: Insufficient documentation

## 2016-10-15 DIAGNOSIS — S4992XA Unspecified injury of left shoulder and upper arm, initial encounter: Secondary | ICD-10-CM | POA: Diagnosis not present

## 2016-10-15 DIAGNOSIS — S299XXA Unspecified injury of thorax, initial encounter: Secondary | ICD-10-CM | POA: Diagnosis not present

## 2016-10-15 DIAGNOSIS — S8001XA Contusion of right knee, initial encounter: Secondary | ICD-10-CM | POA: Diagnosis not present

## 2016-10-15 DIAGNOSIS — M25512 Pain in left shoulder: Secondary | ICD-10-CM | POA: Diagnosis not present

## 2016-10-15 DIAGNOSIS — S29012A Strain of muscle and tendon of back wall of thorax, initial encounter: Secondary | ICD-10-CM | POA: Diagnosis not present

## 2016-10-15 DIAGNOSIS — S29019A Strain of muscle and tendon of unspecified wall of thorax, initial encounter: Secondary | ICD-10-CM

## 2016-10-15 DIAGNOSIS — Z79899 Other long term (current) drug therapy: Secondary | ICD-10-CM | POA: Diagnosis not present

## 2016-10-15 DIAGNOSIS — Y999 Unspecified external cause status: Secondary | ICD-10-CM | POA: Insufficient documentation

## 2016-10-15 DIAGNOSIS — Y92410 Unspecified street and highway as the place of occurrence of the external cause: Secondary | ICD-10-CM | POA: Insufficient documentation

## 2016-10-15 DIAGNOSIS — S8992XA Unspecified injury of left lower leg, initial encounter: Secondary | ICD-10-CM | POA: Diagnosis not present

## 2016-10-15 DIAGNOSIS — M79641 Pain in right hand: Secondary | ICD-10-CM | POA: Diagnosis not present

## 2016-10-15 DIAGNOSIS — Z7984 Long term (current) use of oral hypoglycemic drugs: Secondary | ICD-10-CM | POA: Insufficient documentation

## 2016-10-15 DIAGNOSIS — S6992XA Unspecified injury of left wrist, hand and finger(s), initial encounter: Secondary | ICD-10-CM | POA: Diagnosis not present

## 2016-10-15 DIAGNOSIS — S199XXA Unspecified injury of neck, initial encounter: Secondary | ICD-10-CM | POA: Diagnosis not present

## 2016-10-15 DIAGNOSIS — M546 Pain in thoracic spine: Secondary | ICD-10-CM | POA: Diagnosis not present

## 2016-10-15 DIAGNOSIS — M549 Dorsalgia, unspecified: Secondary | ICD-10-CM | POA: Diagnosis not present

## 2016-10-15 DIAGNOSIS — S161XXA Strain of muscle, fascia and tendon at neck level, initial encounter: Secondary | ICD-10-CM | POA: Insufficient documentation

## 2016-10-15 DIAGNOSIS — M25562 Pain in left knee: Secondary | ICD-10-CM | POA: Diagnosis not present

## 2016-10-15 DIAGNOSIS — Z87891 Personal history of nicotine dependence: Secondary | ICD-10-CM | POA: Diagnosis not present

## 2016-10-15 DIAGNOSIS — S39012A Strain of muscle, fascia and tendon of lower back, initial encounter: Secondary | ICD-10-CM | POA: Diagnosis not present

## 2016-10-15 DIAGNOSIS — M542 Cervicalgia: Secondary | ICD-10-CM | POA: Diagnosis not present

## 2016-10-15 DIAGNOSIS — M545 Low back pain: Secondary | ICD-10-CM | POA: Diagnosis not present

## 2016-10-15 DIAGNOSIS — M79642 Pain in left hand: Secondary | ICD-10-CM | POA: Diagnosis not present

## 2016-10-15 DIAGNOSIS — S6991XA Unspecified injury of right wrist, hand and finger(s), initial encounter: Secondary | ICD-10-CM | POA: Diagnosis not present

## 2016-10-15 MED ORDER — TRAMADOL HCL 50 MG PO TABS
50.0000 mg | ORAL_TABLET | Freq: Once | ORAL | Status: AC
  Administered 2016-10-15: 50 mg via ORAL
  Filled 2016-10-15: qty 1

## 2016-10-15 MED ORDER — DIAZEPAM 5 MG PO TABS
5.0000 mg | ORAL_TABLET | Freq: Once | ORAL | Status: AC
  Administered 2016-10-15: 5 mg via ORAL
  Filled 2016-10-15: qty 1

## 2016-10-15 MED ORDER — TRAMADOL HCL 50 MG PO TABS: 50.0000 mg | ORAL_TABLET | Freq: Four times a day (QID) | ORAL | 0 refills | Status: DC | PRN

## 2016-10-15 MED ORDER — NAPROXEN 500 MG PO TABS: 500.0000 mg | ORAL_TABLET | Freq: Two times a day (BID) | ORAL | 0 refills | Status: DC

## 2016-10-15 MED ORDER — DIAZEPAM 5 MG PO TABS: 5.0000 mg | ORAL_TABLET | Freq: Three times a day (TID) | ORAL | 0 refills | Status: DC | PRN

## 2016-10-15 NOTE — Discharge Instructions (Signed)
Please take medications as prescribed. Ice your hands, shoulder, knees 20 minutes every hour as needed For the next 2-3 days. Tried to walk around with walker as tolerated to help relax the muscles. Return to the ER for any worsening symptoms or urgent changes in her health. Follow-up with her [place] [facility] in 1 week.

## 2016-10-15 NOTE — ED Notes (Signed)
Patient transported to CT 
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
## 2016-10-15 NOTE — ED Notes (Signed)
Returned from CT.
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
## 2016-10-18 ENCOUNTER — Encounter: Payer: Self-pay | Admitting: Family Medicine

## 2016-10-18 ENCOUNTER — Telehealth: Payer: Self-pay | Admitting: Family Medicine

## 2016-10-18 ENCOUNTER — Ambulatory Visit
Admission: RE | Admit: 2016-10-18 | Discharge: 2016-10-18 | Disposition: A | Discharge status: Home / Self Care | Payer: Medicare Other | Source: Ambulatory Visit | Attending: Family Medicine | Admitting: Family Medicine

## 2016-10-18 ENCOUNTER — Ambulatory Visit (INDEPENDENT_AMBULATORY_CARE_PROVIDER_SITE_OTHER): Payer: Medicare Other | Admitting: Family Medicine

## 2016-10-18 VITALS — BP 127/77 | HR 59 | Temp 98.0°F | Wt 173.3 lb

## 2016-10-18 DIAGNOSIS — M47816 Spondylosis without myelopathy or radiculopathy, lumbar region: Secondary | ICD-10-CM

## 2016-10-18 DIAGNOSIS — M899 Disorder of bone, unspecified: Secondary | ICD-10-CM | POA: Diagnosis not present

## 2016-10-18 DIAGNOSIS — Z8551 Personal history of malignant neoplasm of bladder: Secondary | ICD-10-CM | POA: Insufficient documentation

## 2016-10-18 DIAGNOSIS — R32 Unspecified urinary incontinence: Secondary | ICD-10-CM

## 2016-10-18 DIAGNOSIS — F3341 Major depressive disorder, recurrent, in partial remission: Secondary | ICD-10-CM | POA: Diagnosis not present

## 2016-10-18 DIAGNOSIS — M5126 Other intervertebral disc displacement, lumbar region: Secondary | ICD-10-CM | POA: Diagnosis not present

## 2016-10-18 DIAGNOSIS — M7531 Calcific tendinitis of right shoulder: Secondary | ICD-10-CM

## 2016-10-18 DIAGNOSIS — M48061 Spinal stenosis, lumbar region without neurogenic claudication: Secondary | ICD-10-CM | POA: Diagnosis not present

## 2016-10-18 DIAGNOSIS — M545 Low back pain: Secondary | ICD-10-CM | POA: Diagnosis not present

## 2016-10-18 MED ORDER — TRAMADOL HCL 50 MG PO TABS: 50.0000 mg | ORAL_TABLET | Freq: Four times a day (QID) | ORAL | 0 refills | Status: DC | PRN

## 2016-10-18 MED ORDER — DIAZEPAM 5 MG PO TABS: 5.0000 mg | ORAL_TABLET | Freq: Three times a day (TID) | ORAL | 0 refills | Status: DC | PRN

## 2016-10-18 NOTE — Assessment & Plan Note (Signed)
Stable on her lexapro. Continue current regimen. Recheck in 1 month.

## 2016-10-18 NOTE — Telephone Encounter (Signed)
Please call pt and let her know that her MRI didn't reveal any emergent situation and that Dr. Wynetta Emery will go over the results with her and discuss next steps on Tuesday at her follow up appointment.
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
## 2016-10-18 NOTE — Telephone Encounter (Signed)
Patient notified

## 2016-10-18 NOTE — Telephone Encounter (Signed)
Left message to call.

## 2016-10-24 ENCOUNTER — Ambulatory Visit (INDEPENDENT_AMBULATORY_CARE_PROVIDER_SITE_OTHER): Payer: Medicare Other | Admitting: Family Medicine

## 2016-10-24 ENCOUNTER — Encounter: Payer: Self-pay | Admitting: Family Medicine

## 2016-10-24 VITALS — BP 119/79 | HR 82 | Temp 97.4°F | Wt 167.8 lb

## 2016-10-24 DIAGNOSIS — M47816 Spondylosis without myelopathy or radiculopathy, lumbar region: Secondary | ICD-10-CM

## 2016-10-24 DIAGNOSIS — C679 Malignant neoplasm of bladder, unspecified: Secondary | ICD-10-CM

## 2016-10-24 DIAGNOSIS — R937 Abnormal findings on diagnostic imaging of other parts of musculoskeletal system: Secondary | ICD-10-CM | POA: Diagnosis not present

## 2016-10-24 MED ORDER — LIDOCAINE 5 % EX PTCH: 1.0000 | MEDICATED_PATCH | CUTANEOUS | 3 refills | Status: DC
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
## 2016-10-24 NOTE — Assessment & Plan Note (Signed)
Seeing urology again tomorrow. Will send them a note making them aware of abnormal MRI.

## 2016-10-25 ENCOUNTER — Telehealth: Payer: Self-pay | Admitting: Family Medicine

## 2016-10-25 ENCOUNTER — Ambulatory Visit (INDEPENDENT_AMBULATORY_CARE_PROVIDER_SITE_OTHER): Payer: Medicare Other | Admitting: Urology

## 2016-10-25 ENCOUNTER — Encounter: Payer: Self-pay | Admitting: Urology

## 2016-10-25 VITALS — BP 131/84 | HR 83 | Ht 65.5 in | Wt 166.5 lb

## 2016-10-25 DIAGNOSIS — Z8551 Personal history of malignant neoplasm of bladder: Secondary | ICD-10-CM

## 2016-10-25 DIAGNOSIS — N3946 Mixed incontinence: Secondary | ICD-10-CM

## 2016-10-25 DIAGNOSIS — N2889 Other specified disorders of kidney and ureter: Secondary | ICD-10-CM

## 2016-10-25 LAB — MICROSCOPIC EXAMINATION: BACTERIA UA: NONE SEEN

## 2016-10-25 LAB — URINALYSIS, COMPLETE
Bilirubin, UA: NEGATIVE
Glucose, UA: NEGATIVE
Ketones, UA: NEGATIVE
LEUKOCYTES UA: NEGATIVE
Nitrite, UA: NEGATIVE
PH UA: 5 (ref 5.0–7.5)
PROTEIN UA: NEGATIVE
RBC, UA: NEGATIVE
Specific Gravity, UA: >1.03 — ABNORMAL HIGH (ref 1.005–1.030)
UUROB: 0.2 mg/dL (ref 0.2–1.0)

## 2016-10-25 LAB — BLADDER SCAN AMB NON-IMAGING: SCAN RESULT: 0

## 2016-10-25 NOTE — Telephone Encounter (Signed)
Called to check in with Stacy Mercado to see how she's doing on her meds. LMOM.

## 2016-10-25 NOTE — Telephone Encounter (Signed)
-----   Message from Valerie Roys, [place] sent at 10/24/2016 11:07 AM EST ----- Call to see how Jacey's doing back on meds
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
## 2016-10-31 ENCOUNTER — Ambulatory Visit: Payer: Medicare Other | Admitting: Family Medicine

## 2016-10-31 ENCOUNTER — Ambulatory Visit: Payer: Medicare Other

## 2016-11-01 NOTE — Telephone Encounter (Signed)
Called again. Patient scheduled for 11/29. Will close this note.

## 2016-11-06 ENCOUNTER — Telehealth: Payer: Self-pay | Admitting: Urology

## 2016-11-06 MED ORDER — MIRABEGRON ER 50 MG PO TB24: 50.0000 mg | ORAL_TABLET | Freq: Every day | ORAL | 4 refills | Status: DC

## 2016-11-06 NOTE — Telephone Encounter (Signed)
Patient called stating that she was told that she could have a script for [name] and would like this sent to her pharmacy.  Thanks,  Sharyn Lull

## 2016-11-07 ENCOUNTER — Ambulatory Visit: Payer: Medicare Other | Admitting: Family Medicine

## 2016-11-07 NOTE — Telephone Encounter (Signed)
Spoke with pt and made aware medication was sent to pharmacy. Pt voiced understanding.

## 2016-11-08 ENCOUNTER — Ambulatory Visit: Admission: RE | Admit: 2016-11-08 | Payer: Medicare Other | Source: Ambulatory Visit

## 2016-11-08 ENCOUNTER — Ambulatory Visit: Payer: Medicare Other

## 2016-11-14 ENCOUNTER — Telehealth: Payer: Self-pay

## 2016-11-14 DIAGNOSIS — N3281 Overactive bladder: Secondary | ICD-10-CM

## 2016-11-14 DIAGNOSIS — E119 Type 2 diabetes mellitus without complications: Secondary | ICD-10-CM

## 2016-11-14 NOTE — Telephone Encounter (Signed)
Pt called stating myrbetriq is going to cost her $1000. Pt requested another medication. Please advise.

## 2016-11-14 NOTE — Telephone Encounter (Signed)
Diane from MRI called, they need a BUN and Creatinine done before she can have contrast.   Order for BMP placed.

## 2016-11-14 NOTE — Telephone Encounter (Signed)
She can try oxybutynin 5 mg 3 times a day. This is typically cheaper but does have side effects such as dry eyes, dry mouth, constipation. Also, a can occasionally cause confusion.  Hollice Espy, MD

## 2016-11-15 ENCOUNTER — Other Ambulatory Visit
Admission: RE | Admit: 2016-11-15 | Discharge: 2016-11-15 | Disposition: A | Discharge status: Home / Self Care | Payer: Medicare Other | Source: Ambulatory Visit | Attending: Family Medicine | Admitting: Family Medicine

## 2016-11-15 ENCOUNTER — Ambulatory Visit: Payer: Medicare Other | Admitting: Family Medicine

## 2016-11-15 ENCOUNTER — Ambulatory Visit
Admission: RE | Admit: 2016-11-15 | Discharge: 2016-11-15 | Disposition: A | Discharge status: Home / Self Care | Payer: Medicare Other | Source: Ambulatory Visit | Attending: Family Medicine | Admitting: Family Medicine

## 2016-11-15 DIAGNOSIS — C671 Malignant neoplasm of dome of bladder: Secondary | ICD-10-CM | POA: Diagnosis not present

## 2016-11-15 DIAGNOSIS — R937 Abnormal findings on diagnostic imaging of other parts of musculoskeletal system: Secondary | ICD-10-CM | POA: Diagnosis not present

## 2016-11-15 DIAGNOSIS — R946 Abnormal results of thyroid function studies: Secondary | ICD-10-CM | POA: Insufficient documentation

## 2016-11-15 DIAGNOSIS — C679 Malignant neoplasm of bladder, unspecified: Secondary | ICD-10-CM | POA: Diagnosis not present

## 2016-11-15 LAB — BASIC METABOLIC PANEL
ANION GAP: 7 (ref 5–15)
BUN: 15 mg/dL (ref 6–20)
CALCIUM: 10.4 mg/dL — AB (ref 8.9–10.3)
CO2: 29 mmol/L (ref 22–32)
Chloride: 105 mmol/L (ref 101–111)
Creatinine, Ser: 0.75 mg/dL (ref 0.44–1.00)
GFR calc Af Amer: >60 mL/min (ref >60)
GLUCOSE: 102 mg/dL — AB (ref 65–99)
Potassium: 4.1 mmol/L (ref 3.5–5.1)
Sodium: 141 mmol/L (ref 135–145)

## 2016-11-15 MED ORDER — GADOBENATE DIMEGLUMINE 529 MG/ML IV SOLN
15.0000 mL | Freq: Once | INTRAVENOUS | Status: AC | PRN
  Administered 2016-11-15: 15 mL via INTRAVENOUS

## 2016-11-15 NOTE — Addendum Note (Signed)
Addended by: Memory Argue H on: 11/15/2016 12:39 PM   Modules accepted: Orders

## 2016-11-15 NOTE — Telephone Encounter (Signed)
No answer

## 2016-11-15 NOTE — Addendum Note (Signed)
Addended by: Memory Argue H on: 11/15/2016 12:40 PM   Modules accepted: Orders

## 2016-11-16 ENCOUNTER — Telehealth: Payer: Self-pay | Admitting: Family Medicine

## 2016-11-16 LAB — THYROID PANEL WITH TSH
FREE THYROXINE INDEX: 1.4 (ref 1.2–4.9)
T3 Uptake Ratio: 26 % (ref 24–39)
T4 TOTAL: 5.4 ug/dL (ref 4.5–12.0)
TSH: 2.98 u[IU]/mL (ref 0.450–4.500)

## 2016-11-16 NOTE — Telephone Encounter (Signed)
Please let her know that her labs came back normal. Her MRI was also nice and normal! Thanks!

## 2016-11-16 NOTE — Telephone Encounter (Signed)
LMOM

## 2016-11-20 MED ORDER — OXYBUTYNIN CHLORIDE 5 MG PO TABS: 5.0000 mg | ORAL_TABLET | Freq: Three times a day (TID) | ORAL | 3 refills | Status: DC

## 2016-11-20 NOTE — Telephone Encounter (Signed)
Spoke with pt in reference to OAB medications and side effects. Pt voiced understanding. Medication was sent to pt pharmacy.

## 2016-11-20 NOTE — Telephone Encounter (Signed)
Patient notified

## 2016-11-28 ENCOUNTER — Ambulatory Visit (INDEPENDENT_AMBULATORY_CARE_PROVIDER_SITE_OTHER): Payer: Medicare Other | Admitting: Family Medicine

## 2016-11-28 ENCOUNTER — Encounter: Payer: Self-pay | Admitting: Family Medicine

## 2016-11-28 VITALS — BP 119/70 | HR 59 | Temp 98.4°F | Wt 166.5 lb

## 2016-11-28 DIAGNOSIS — Z23 Encounter for immunization: Secondary | ICD-10-CM

## 2016-11-28 DIAGNOSIS — M545 Low back pain, unspecified: Secondary | ICD-10-CM

## 2016-11-28 DIAGNOSIS — B9789 Other viral agents as the cause of diseases classified elsewhere: Secondary | ICD-10-CM | POA: Diagnosis not present

## 2016-11-28 DIAGNOSIS — M7531 Calcific tendinitis of right shoulder: Secondary | ICD-10-CM

## 2016-11-28 DIAGNOSIS — J069 Acute upper respiratory infection, unspecified: Secondary | ICD-10-CM | POA: Diagnosis not present

## 2016-11-28 DIAGNOSIS — S61209A Unspecified open wound of unspecified finger without damage to nail, initial encounter: Secondary | ICD-10-CM | POA: Diagnosis not present
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
## 2016-11-28 NOTE — Patient Instructions (Signed)
Tdap Vaccine (Tetanus, Diphtheria and Pertussis): What You Need to Know 1. Why get vaccinated? Tetanus, diphtheria and pertussis are very serious diseases. Tdap vaccine can protect us from these diseases. And, Tdap vaccine given to pregnant women can protect newborn babies against pertussis. TETANUS (Lockjaw) is rare in the [place] today. It causes painful muscle tightening and stiffness, usually all over the body.  It can lead to tightening of muscles in the head and neck so you can't open your mouth, swallow, or sometimes even breathe. Tetanus kills about 1 out of 10 people who are infected even after receiving the best medical care.  DIPHTHERIA is also rare in the [place] today. It can cause a thick coating to form in the back of the throat.  It can lead to breathing problems, heart failure, paralysis, and death.  PERTUSSIS (Whooping Cough) causes severe coughing spells, which can cause difficulty breathing, vomiting and disturbed sleep.  It can also lead to weight loss, incontinence, and rib fractures. Up to 2 in 100 adolescents and 5 in 100 adults with pertussis are hospitalized or have complications, which could include pneumonia or death.  These diseases are caused by bacteria. Diphtheria and pertussis are spread from person to person through secretions from coughing or sneezing. Tetanus enters the body through cuts, scratches, or wounds. Before vaccines, as many as 200,000 cases of diphtheria, 200,000 cases of pertussis, and hundreds of cases of tetanus, were reported in the [place] each year. Since vaccination began, reports of cases for tetanus and diphtheria have dropped by about 99% and for pertussis by about 80%. 2. Tdap vaccine Tdap vaccine can protect adolescents and adults from tetanus, diphtheria, and pertussis. One dose of Tdap is routinely given at age 11 or 12. People who did not get Tdap at that age should get it as soon as possible. Tdap is especially  important for healthcare professionals and anyone having close contact with a baby younger than 12 months. Pregnant women should get a dose of Tdap during every pregnancy, to protect the newborn from pertussis. Infants are most at risk for severe, life-threatening complications from pertussis. Another vaccine, called Td, protects against tetanus and diphtheria, but not pertussis. A Td booster should be given every 10 years. Tdap may be given as one of these boosters if you have never gotten Tdap before. Tdap may also be given after a severe cut or burn to prevent tetanus infection. Your doctor or the person giving you the vaccine can give you more information. Tdap may safely be given at the same time as other vaccines. 3. Some people should not get this vaccine  A person who has ever had a life-threatening allergic reaction after a previous dose of any diphtheria, tetanus or pertussis containing vaccine, OR has a severe allergy to any part of this vaccine, should not get Tdap vaccine. Tell the person giving the vaccine about any severe allergies.  Anyone who had coma or long repeated seizures within 7 days after a childhood dose of DTP or DTaP, or a previous dose of Tdap, should not get Tdap, unless a cause other than the vaccine was found. They can still get Td.  Talk to your doctor if you: ? have seizures or another nervous system problem, ? had severe pain or swelling after any vaccine containing diphtheria, tetanus or pertussis, ? ever had a condition called Guillain-Barr Syndrome (GBS), ? aren't feeling well on the day the shot is scheduled. 4. Risks With any medicine, including   vaccines, there is a chance of side effects. These are usually mild and go away on their own. Serious reactions are also possible but are rare. Most people who get Tdap vaccine do not have any problems with it. Mild problems following Tdap: (Did not interfere with activities)  Pain where the shot was given (about  3 in 4 adolescents or 2 in 3 adults)  Redness or swelling where the shot was given (about 1 person in 5)  Mild fever of at least 100.4F (up to about 1 in 25 adolescents or 1 in 100 adults)  Headache (about 3 or 4 people in 10)  Tiredness (about 1 person in 3 or 4)  Nausea, vomiting, diarrhea, stomach ache (up to 1 in 4 adolescents or 1 in 10 adults)  Chills, sore joints (about 1 person in 10)  Body aches (about 1 person in 3 or 4)  Rash, swollen glands (uncommon)  Moderate problems following Tdap: (Interfered with activities, but did not require medical attention)  Pain where the shot was given (up to 1 in 5 or 6)  Redness or swelling where the shot was given (up to about 1 in 16 adolescents or 1 in 12 adults)  Fever over 102F (about 1 in 100 adolescents or 1 in 250 adults)  Headache (about 1 in 7 adolescents or 1 in 10 adults)  Nausea, vomiting, diarrhea, stomach ache (up to 1 or 3 people in 100)  Swelling of the entire arm where the shot was given (up to about 1 in 500).  Severe problems following Tdap: (Unable to perform usual activities; required medical attention)  Swelling, severe pain, bleeding and redness in the arm where the shot was given (rare).  Problems that could happen after any vaccine:  People sometimes faint after a medical procedure, including vaccination. Sitting or lying down for about 15 minutes can help prevent fainting, and injuries caused by a fall. Tell your doctor if you feel dizzy, or have vision changes or ringing in the ears.  Some people get severe pain in the shoulder and have difficulty moving the arm where a shot was given. This happens very rarely.  Any medication can cause a severe allergic reaction. Such reactions from a vaccine are very rare, estimated at fewer than 1 in a million doses, and would happen within a few minutes to a few hours after the vaccination. As with any medicine, there is a very remote chance of a vaccine  causing a serious injury or death. The safety of vaccines is always being monitored. For more information, visit: www.cdc.gov/vaccinesafety/ 5. What if there is a serious problem? What should I look for? Look for anything that concerns you, such as signs of a severe allergic reaction, very high fever, or unusual behavior. Signs of a severe allergic reaction can include hives, swelling of the face and throat, difficulty breathing, a fast heartbeat, dizziness, and weakness. These would usually start a few minutes to a few hours after the vaccination. What should I do?  If you think it is a severe allergic reaction or other emergency that can't wait, call 9-1-1 or get the person to the nearest hospital. Otherwise, call your doctor.  Afterward, the reaction should be reported to the Vaccine Adverse Event Reporting System (VAERS). Your doctor might file this report, or you can do it yourself through the VAERS web site at www.vaers.hhs.gov, or by calling 1-800-822-7967. ? VAERS does not give medical advice. 6. The National Vaccine Injury Compensation Program The National   Vaccine Injury Compensation Program (VICP) is a federal program that was created to compensate people who may have been injured by certain vaccines. Persons who believe they may have been injured by a vaccine can learn about the program and about filing a claim by calling 1-800-338-2382 or visiting the VICP website at www.hrsa.gov/vaccinecompensation. There is a time limit to file a claim for compensation. 7. How can I learn more?  Ask your doctor. He or she can give you the vaccine package insert or suggest other sources of information.  Call your local or state health department.  Contact the Centers for Disease Control and Prevention (CDC): ? Call 1-800-232-4636 (1-800-CDC-INFO) or ? Visit CDC's website at www.cdc.gov/vaccines CDC Tdap Vaccine VIS (02/10/14) This information is not intended to replace advice given to you by your  health care provider. Make sure you discuss any questions you have with your health care provider. Document Released: 06/04/2012 Document Revised: 08/24/2016 Document Reviewed: 08/24/2016 Elsevier Interactive Patient Education  2017 Elsevier Inc.  
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
## 2016-12-13 ENCOUNTER — Other Ambulatory Visit: Payer: Self-pay | Admitting: Family Medicine

## 2016-12-13 NOTE — Telephone Encounter (Signed)
OK to call in

## 2016-12-13 NOTE — Telephone Encounter (Signed)
Your patient 

## 2016-12-13 NOTE — Telephone Encounter (Signed)
RX called into pharmacy

## 2016-12-20 ENCOUNTER — Ambulatory Visit
Admission: RE | Admit: 2016-12-20 | Discharge: 2016-12-20 | Disposition: A | Discharge status: Home / Self Care | Payer: Medicare Other | Source: Ambulatory Visit | Attending: Urology | Admitting: Urology

## 2016-12-20 ENCOUNTER — Other Ambulatory Visit: Payer: Medicare Other | Admitting: Urology

## 2016-12-20 DIAGNOSIS — K5730 Diverticulosis of large intestine without perforation or abscess without bleeding: Secondary | ICD-10-CM | POA: Insufficient documentation

## 2016-12-20 DIAGNOSIS — I2510 Atherosclerotic heart disease of native coronary artery without angina pectoris: Secondary | ICD-10-CM | POA: Diagnosis not present

## 2016-12-20 DIAGNOSIS — I700 Atherosclerosis of aorta: Secondary | ICD-10-CM | POA: Insufficient documentation

## 2016-12-20 DIAGNOSIS — N2889 Other specified disorders of kidney and ureter: Secondary | ICD-10-CM

## 2016-12-20 DIAGNOSIS — N200 Calculus of kidney: Secondary | ICD-10-CM | POA: Insufficient documentation

## 2016-12-20 MED ORDER — IOPAMIDOL (ISOVUE-370) INJECTION 76%
100.0000 mL | Freq: Once | INTRAVENOUS | Status: AC | PRN
  Administered 2016-12-20: 100 mL via INTRAVENOUS

## 2016-12-25 ENCOUNTER — Other Ambulatory Visit: Payer: Self-pay | Admitting: Student

## 2016-12-25 DIAGNOSIS — M7581 Other shoulder lesions, right shoulder: Secondary | ICD-10-CM | POA: Diagnosis not present

## 2016-12-25 DIAGNOSIS — M25511 Pain in right shoulder: Secondary | ICD-10-CM | POA: Diagnosis not present

## 2016-12-25 DIAGNOSIS — G8929 Other chronic pain: Secondary | ICD-10-CM

## 2016-12-27 ENCOUNTER — Encounter: Payer: Self-pay | Admitting: Urology

## 2016-12-27 ENCOUNTER — Ambulatory Visit (INDEPENDENT_AMBULATORY_CARE_PROVIDER_SITE_OTHER): Payer: Medicare Other | Admitting: Urology

## 2016-12-27 VITALS — BP 134/76 | HR 68 | Ht 65.0 in | Wt 163.2 lb

## 2016-12-27 DIAGNOSIS — C679 Malignant neoplasm of bladder, unspecified: Secondary | ICD-10-CM

## 2016-12-27 DIAGNOSIS — N2889 Other specified disorders of kidney and ureter: Secondary | ICD-10-CM | POA: Diagnosis not present

## 2016-12-27 LAB — MICROSCOPIC EXAMINATION
Bacteria, UA: NONE SEEN
RBC MICROSCOPIC, UA: NONE SEEN /HPF (ref 0–?)

## 2016-12-27 LAB — URINALYSIS, COMPLETE
Bilirubin, UA: NEGATIVE
GLUCOSE, UA: NEGATIVE
Ketones, UA: NEGATIVE
NITRITE UA: POSITIVE — AB
Specific Gravity, UA: 1.025 (ref 1.005–1.030)
UUROB: 0.2 mg/dL (ref 0.2–1.0)
pH, UA: 6 (ref 5.0–7.5)

## 2016-12-27 MED ORDER — CIPROFLOXACIN HCL 500 MG PO TABS: 500.0000 mg | ORAL_TABLET | Freq: Once | ORAL | Status: DC

## 2016-12-27 MED ORDER — CEPHALEXIN 500 MG PO CAPS: 500.0000 mg | ORAL_CAPSULE | Freq: Four times a day (QID) | ORAL | 0 refills | Status: DC

## 2016-12-27 MED ORDER — LIDOCAINE HCL 2 % EX GEL: 1.0000 "application " | Freq: Once | CUTANEOUS | Status: DC

## 2016-12-27 NOTE — Progress Notes (Signed)
12/27/16  Patient presented today for cystoscopy, however, she is a floridly positive urinalysis concerning for infection (cystititis). She is also symptomatic with dysuria.  Urine is nitrite positive with too numerous to count white blood cells, red blood. Bacteria difficult to see due to WBC clumping.    She was prescribed Keflex 4 times a day 7 days, we will adjust as needed based on urine culture and sensitivity data.  She'll be rescheduled for cystoscopy in approximately 2 weeks. She is agreeable with this plan.   Hollice Espy, MD

## 2016-12-29 ENCOUNTER — Ambulatory Visit
Admission: RE | Admit: 2016-12-29 | Discharge: 2016-12-29 | Disposition: A | Discharge status: Home / Self Care | Payer: Medicare Other | Source: Ambulatory Visit | Attending: Student | Admitting: Student

## 2016-12-29 DIAGNOSIS — M19011 Primary osteoarthritis, right shoulder: Secondary | ICD-10-CM | POA: Insufficient documentation

## 2016-12-29 DIAGNOSIS — G8929 Other chronic pain: Secondary | ICD-10-CM

## 2016-12-29 DIAGNOSIS — S46011A Strain of muscle(s) and tendon(s) of the rotator cuff of right shoulder, initial encounter: Secondary | ICD-10-CM | POA: Insufficient documentation

## 2016-12-29 DIAGNOSIS — W19XXXA Unspecified fall, initial encounter: Secondary | ICD-10-CM | POA: Insufficient documentation

## 2016-12-29 DIAGNOSIS — M75101 Unspecified rotator cuff tear or rupture of right shoulder, not specified as traumatic: Secondary | ICD-10-CM | POA: Diagnosis present

## 2016-12-29 DIAGNOSIS — S43401A Unspecified sprain of right shoulder joint, initial encounter: Secondary | ICD-10-CM | POA: Diagnosis not present

## 2016-12-29 DIAGNOSIS — M7521 Bicipital tendinitis, right shoulder: Secondary | ICD-10-CM | POA: Insufficient documentation

## 2016-12-29 DIAGNOSIS — M25511 Pain in right shoulder: Secondary | ICD-10-CM

## 2016-12-31 LAB — CULTURE, URINE COMPREHENSIVE

## 2017-01-01 ENCOUNTER — Telehealth: Payer: Self-pay | Admitting: Family Medicine

## 2017-01-01 DIAGNOSIS — M25511 Pain in right shoulder: Secondary | ICD-10-CM | POA: Diagnosis not present

## 2017-01-01 DIAGNOSIS — M75121 Complete rotator cuff tear or rupture of right shoulder, not specified as traumatic: Secondary | ICD-10-CM | POA: Diagnosis not present

## 2017-01-01 DIAGNOSIS — G8929 Other chronic pain: Secondary | ICD-10-CM | POA: Diagnosis not present

## 2017-01-01 MED ORDER — SULFAMETHOXAZOLE-TRIMETHOPRIM 800-160 MG PO TABS: 1.0000 | ORAL_TABLET | Freq: Two times a day (BID) | ORAL | 0 refills | Status: DC

## 2017-01-01 NOTE — Telephone Encounter (Signed)
Left message for patient to pick up ABX.

## 2017-01-01 NOTE — Telephone Encounter (Signed)
-----   Message from Hollice Espy, MD sent at 12/31/2016  3:36 PM EST ----- Please add Bactrim DS bid x 7 days to her abx.  She grew 2 different bacteria which require two different abx.    Hollice Espy, MD

## 2017-01-05 ENCOUNTER — Other Ambulatory Visit: Payer: Medicare Other | Admitting: Urology

## 2017-01-10 ENCOUNTER — Other Ambulatory Visit: Payer: Self-pay | Admitting: Family Medicine

## 2017-01-10 ENCOUNTER — Ambulatory Visit (INDEPENDENT_AMBULATORY_CARE_PROVIDER_SITE_OTHER): Payer: Medicare Other | Admitting: Urology

## 2017-01-10 ENCOUNTER — Encounter: Payer: Self-pay | Admitting: Urology

## 2017-01-10 VITALS — BP 118/87 | HR 66 | Ht 65.0 in | Wt 166.7 lb

## 2017-01-10 DIAGNOSIS — Z8551 Personal history of malignant neoplasm of bladder: Secondary | ICD-10-CM

## 2017-01-10 DIAGNOSIS — N3281 Overactive bladder: Secondary | ICD-10-CM

## 2017-01-10 DIAGNOSIS — N2889 Other specified disorders of kidney and ureter: Secondary | ICD-10-CM | POA: Diagnosis not present

## 2017-01-10 LAB — MICROSCOPIC EXAMINATION: Bacteria, UA: NONE SEEN

## 2017-01-10 LAB — URINALYSIS, COMPLETE
Bilirubin, UA: NEGATIVE
GLUCOSE, UA: NEGATIVE
Ketones, UA: NEGATIVE
Leukocytes, UA: NEGATIVE
NITRITE UA: NEGATIVE
Protein, UA: NEGATIVE
Specific Gravity, UA: 1.01 (ref 1.005–1.030)
UUROB: 0.2 mg/dL (ref 0.2–1.0)
pH, UA: 6.5 (ref 5.0–7.5)
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
## 2017-01-19 ENCOUNTER — Other Ambulatory Visit: Payer: Self-pay

## 2017-01-19 DIAGNOSIS — N3281 Overactive bladder: Secondary | ICD-10-CM

## 2017-01-19 MED ORDER — FESOTERODINE FUMARATE ER 4 MG PO TB24: 4.0000 mg | ORAL_TABLET | Freq: Every day | ORAL | 3 refills | Status: DC

## 2017-01-22 ENCOUNTER — Telehealth: Payer: Self-pay

## 2017-01-22 MED ORDER — ATROPINE SULFATE 1 MG/10ML IJ SOSY
PREFILLED_SYRINGE | INTRAMUSCULAR | Status: AC
  Filled 2017-01-22: qty 10

## 2017-01-22 NOTE — Telephone Encounter (Signed)
Pt called stating myrbetriq is over $1000 and Lisbeth Ply is over $400. Pt stated that myrbetriq worked the best. Therefore made pt aware will provide samples. Pt voiced understanding.

## 2017-01-23 ENCOUNTER — Encounter
Admission: RE | Admit: 2017-01-23 | Discharge: 2017-01-23 | Disposition: A | Discharge status: Home / Self Care | Payer: Medicare Other | Source: Ambulatory Visit | Attending: Surgery | Admitting: Surgery

## 2017-01-23 DIAGNOSIS — E119 Type 2 diabetes mellitus without complications: Secondary | ICD-10-CM | POA: Diagnosis not present

## 2017-01-23 DIAGNOSIS — I10 Essential (primary) hypertension: Secondary | ICD-10-CM | POA: Insufficient documentation

## 2017-01-23 DIAGNOSIS — Z01818 Encounter for other preprocedural examination: Secondary | ICD-10-CM | POA: Insufficient documentation

## 2017-01-23 DIAGNOSIS — E785 Hyperlipidemia, unspecified: Secondary | ICD-10-CM | POA: Insufficient documentation

## 2017-01-23 LAB — CBC
HCT: 35.9 % (ref 35.0–47.0)
Hemoglobin: 12.1 g/dL (ref 12.0–16.0)
MCH: 29.5 pg (ref 26.0–34.0)
MCHC: 33.7 g/dL (ref 32.0–36.0)
MCV: 87.4 fL (ref 80.0–100.0)
PLATELETS: 180 10*3/uL (ref 150–440)
RBC: 4.11 MIL/uL (ref 3.80–5.20)
RDW: 13.2 % (ref 11.5–14.5)
WBC: 5.2 10*3/uL (ref 3.6–11.0)

## 2017-01-23 LAB — BASIC METABOLIC PANEL
Anion gap: 5 (ref 5–15)
BUN: 19 mg/dL (ref 6–20)
CALCIUM: 10 mg/dL (ref 8.9–10.3)
CO2: 31 mmol/L (ref 22–32)
CREATININE: 0.69 mg/dL (ref 0.44–1.00)
Chloride: 103 mmol/L (ref 101–111)
Glucose, Bld: 91 mg/dL (ref 65–99)
Potassium: 4.2 mmol/L (ref 3.5–5.1)
SODIUM: 139 mmol/L (ref 135–145)

## 2017-01-23 NOTE — Patient Instructions (Signed)
Your procedure is scheduled on: Tuesday 01/30/17 Report to [facility]. 2ND FLOOR MEDICAL MALL ENTRANCE. To find out your arrival time please call [phone] between 1PM - 3PM on Monday 01/29/17.  Remember: Instructions that are not followed completely may result in serious medical risk, up to and including death, or upon the discretion of your surgeon and anesthesiologist your surgery may need to be rescheduled.    __X__ 1. Do not eat food or drink liquids after midnight. No gum chewing or hard candies.     __X__ 2. No Alcohol for 24 hours before or after surgery.   ____ 3. Bring all medications with you on the day of surgery if instructed.    __X__ 4. Notify your doctor if there is any change in your medical condition     (cold, fever, infections).             __X___5. No smoking within 24 hours of your surgery.     Do not wear jewelry, make-up, hairpins, clips or nail polish.  Do not wear lotions, powders, or perfumes.   Do not shave 48 hours prior to surgery. Men may shave face and neck.  Do not bring valuables to the hospital.    [facility] is not responsible for any belongings or valuables.               Contacts, dentures or bridgework may not be worn into surgery.  Leave your suitcase in the car. After surgery it may be brought to your room.  For patients admitted to the hospital, discharge time is determined by your                treatment team.   Patients discharged the day of surgery will not be allowed to drive home.   Please read over the following fact sheets that you were given:   Pain Booklet and MRSA Information   __X__ Take these medicines the morning of surgery with A SIP OF WATER:    1. ESCITALOPRM  2. LOSARTAN  3. TRAMADOL IF NEEDED  4.  5.  6.  ____ Fleet Enema (as directed)   __X__ Use CHG Soap as directed  ____ Use inhalers on the day of surgery  __X__ Stop metformin 2 days prior to surgery    ____ Take 1/2 of usual insulin dose the night  before surgery and none on the morning of surgery.   __X__ Stop Coumadin/Plavix/aspirin on TODAY ALSO STOP MELOXICAM  __X__ Stop Anti-inflammatories such as Advil, Aleve, Ibuprofen, Motrin, Naproxen, Naprosyn, Goodies,powder, or aspirin products.  OK to take Tylenol.   __X__ Stop supplements until after surgery.  STOP GARCINIA CAMBOGIA-CHROMUIM  ____ Bring C-Pap to the hospital.

## 2017-01-23 NOTE — Pre-Procedure Instructions (Signed)
MEDICAL CLEARANCE REQUEST/EKG,AS INSTRUCTED BY DR Ronelle Nigh, FAXED TO PCP OFFICE.CLOSED FOR LUNCH. ALSO FAXED TO DR POGGI LM FOR TIFFANY

## 2017-01-24 NOTE — Pre-Procedure Instructions (Signed)
VERIFIED DR Mellody Memos HAS CLEARANCE REQUEST AND TO SEE PATIENT

## 2017-01-25 ENCOUNTER — Ambulatory Visit (INDEPENDENT_AMBULATORY_CARE_PROVIDER_SITE_OTHER): Payer: Medicare Other | Admitting: Family Medicine

## 2017-01-25 ENCOUNTER — Encounter: Payer: Self-pay | Admitting: Family Medicine

## 2017-01-25 VITALS — BP 139/71 | HR 69 | Temp 98.6°F | Ht 65.0 in | Wt 163.8 lb

## 2017-01-25 DIAGNOSIS — Z01818 Encounter for other preprocedural examination: Secondary | ICD-10-CM | POA: Diagnosis not present

## 2017-01-25 DIAGNOSIS — R9431 Abnormal electrocardiogram [ECG] [EKG]: Secondary | ICD-10-CM | POA: Diagnosis not present

## 2017-01-25 NOTE — Progress Notes (Signed)
BP 139/71 (BP Location: Left Arm, Patient Position: Sitting, Cuff Size: Normal)   Pulse 69   Temp 98.6 F (37 C)   Ht 5\' 5"  (1.651 m)   Wt 163 lb 12.8 oz (74.3 kg)   SpO2 97%   BMI 27.26 kg/m    Subjective:    Patient ID: Stacy Mercado, female    DOB: 08-24-1946, 71 y.o.   MRN: [id number]  HPI: Stacy Mercado is a 71 y.o. female  Chief Complaint  Patient presents with  . Surgery Clearance    pt is scheduled to shoulder surgery 01/30/17   Has never had problems with anesthesia before. Has never had any problems with getting extubated. No one in the family has every had any problems with anesthesia. She notes that she has had several surgeries in the past, the last about 2 years ago. She can easily walk up 2-3 flights of stairs without and CP, she has had some minor SOB which resolves with sitting for a couple of seconds. She has never had any problems with her heart. She is otherwise feeling well with no other concerns or complaints at this time.   Active Ambulatory Problems    Diagnosis Date Noted  . Bladder cancer ([place])   . Controlled type 2 diabetes mellitus ([place])   . Depression   . Benign hypertensive renal disease   . PVD (peripheral vascular disease) ([place])   . Right kidney mass 08/07/2016  . Hyperlipidemia 08/07/2016  . Urge incontinence of urine 08/07/2016  . 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 Cataracts, bilateral 08/14/2016  . Calcific tendonitis of right shoulder 08/14/2016   Resolved Ambulatory Problems    Diagnosis Date Noted  . Diabetes mellitus without complication ([place])   . Hypertension   . CKD (chronic kidney disease)    Past Medical History:  Diagnosis Date  . Bladder cancer ([place])   . Chronic low back pain   . Depression   . Diabetes mellitus without complication ([place])   . Hyperlipidemia 08/07/2016  . Hypertension   . PVD (peripheral vascular disease) ([place])   . Urge incontinence of urine 08/07/2016   Past Surgical History:    Procedure Laterality Date  . APPENDECTOMY  1961  . bladder cancer removed  2014  . complete hysterectomy  1999  . DILATION AND CURETTAGE OF UTERUS  1999  . L rotator cuff Left 2010  . R thumb ruptured ligament and tendon  2012   Outpatient Encounter Prescriptions as of 01/25/2017  Medication Sig  . acetaminophen (TYLENOL) 500 MG tablet Take 1,000 mg by mouth every 6 (six) hours as needed for headache.   . escitalopram (LEXAPRO) 10 MG tablet Take 1 tablet (10 mg total) by mouth daily.  Marland Kitchen losartan (COZAAR) 50 MG tablet Take 1 tablet (50 mg total) by mouth daily.  . metFORMIN (GLUCOPHAGE) 1000 MG tablet Take 1 tablet (1,000 mg total) by mouth 2 (two) times daily with a meal.  . mirabegron ER ([name]) 50 MG TB24 tablet Take 1 tablet (50 mg total) by mouth daily.  . traMADol (ULTRAM) 50 MG tablet TAKE 1 TO 2 TABLETS BY MOUTH FOUR TIMES DAILY AS NEEDED FOR SEVERE PAIN  . aspirin EC 81 MG tablet Take 81 mg by mouth daily.  . baclofen (LIORESAL) 10 MG tablet Take 1 tablet (10 mg total) by mouth 2 (two) times daily. (Patient not taking: Reported on 01/18/2017)  . diazepam (VALIUM) 5 MG tablet Take 1 tablet (5 mg total)  by mouth every 8 (eight) hours as needed for muscle spasms. (Patient not taking: Reported on 01/25/2017)  . fesoterodine (TOVIAZ) 4 MG TB24 tablet Take 1 tablet (4 mg total) by mouth daily. (Patient not taking: Reported on 01/23/2017)  . fluticasone (FLONASE) 50 MCG/ACT nasal spray Place 2 sprays into both nostrils daily. (Patient not taking: Reported on 01/25/2017)  . GARCINIA CAMBOGIA-CHROMIUM PO Take 1 tablet by mouth 2 (two) times daily.   Marland Kitchen lidocaine (LIDODERM) 5 % Place 1 patch onto the skin daily. Remove & Discard patch within 12 hours or as directed by MD (Patient not taking: Reported on 01/18/2017)  . Lidocaine-Menthol (ICY HOT LIDOCAINE PLUS MENTHOL EX) Apply 1 application topically 2 (two) times daily as needed (muscle pain).  . meloxicam (MOBIC) 15 MG tablet TAKE 1 TABLET(15 MG) BY  MOUTH DAILY (Patient not taking: Reported on 01/25/2017)  . Multiple Vitamin (MULTIVITAMIN WITH MINERALS) TABS tablet Take 1 tablet by mouth daily.  . naproxen (NAPROSYN) 500 MG tablet Take 1 tablet (500 mg total) by mouth 2 (two) times daily with a meal. (Patient not taking: Reported on 01/18/2017)  . [DISCONTINUED] cephALEXin (KEFLEX) 500 MG capsule Take 1 capsule (500 mg total) by mouth 4 (four) times daily. (Patient not taking: Reported on 01/18/2017)  . [DISCONTINUED] sulfamethoxazole-trimethoprim (BACTRIM DS,SEPTRA DS) 800-160 MG tablet Take 1 tablet by mouth every 12 (twelve) hours. (Patient not taking: Reported on 01/18/2017)   No facility-administered encounter medications on file as of 01/25/2017.    Allergies  Allergen Reactions  . Statins Other (See Comments)    myalgia   Social History   Social History  . Marital status: Single    Spouse name: N/A  . Number of children: N/A  . Years of education: N/A   Social History Main Topics  . Smoking status: Former Smoker    Years: 50.00    Types: Cigarettes    Quit date: 12/18/2006  . Smokeless tobacco: Never Used  . Alcohol use 4.2 oz/week    7 Shots of liquor per week     Comment: When she is not on any medications  . Drug use: No  . Sexual activity: Not Asked   Other Topics Concern  . None   Social History Narrative  . None   Family History  Problem Relation Age of Onset  . Hypertension Brother   . Diabetes Brother   . Healthy Son   . Healthy Son   . Healthy Daughter   . Kidney cancer Neg Hx   . Bladder Cancer Neg Hx     Review of Systems  Constitutional: Negative.   Respiratory: Negative.   Cardiovascular: Negative.   Gastrointestinal: Negative.   Musculoskeletal: Positive for arthralgias, back pain, myalgias, neck pain and neck stiffness. Negative for gait problem and joint swelling.  Psychiatric/Behavioral: Negative.     Per HPI unless specifically indicated above     Objective:    BP 139/71 (BP Location:  Left Arm, Patient Position: Sitting, Cuff Size: Normal)   Pulse 69   Temp 98.6 F (37 C)   Ht 5\' 5"  ([id number] m)   Wt 163 lb 12.8 oz (74.3 kg)   SpO2 97%   BMI 27.26 kg/m   Wt Readings from Last 3 Encounters:  01/25/17 163 lb 12.8 oz (74.3 kg)  01/23/17 166 lb (75.3 kg)  01/10/17 166 lb 11.2 oz (75.6 kg)    Physical Exam  Constitutional: She is oriented to person, place, and time. She appears well-developed and  well-nourished. No distress.  HENT:  Head: Normocephalic and atraumatic.  Right Ear: Hearing normal.  Left Ear: Hearing normal.  Nose: Nose normal.  Eyes: Conjunctivae and lids are normal. Right eye exhibits no discharge. Left eye exhibits no discharge. No scleral icterus.  Cardiovascular: Normal rate, regular rhythm, normal heart sounds and intact distal pulses.  Exam reveals no gallop and no friction rub.   No murmur heard. Pulmonary/Chest: Effort normal and breath sounds normal. No respiratory distress. She has no wheezes. She has no rales. She exhibits no tenderness.  Musculoskeletal: Normal range of motion.  Neurological: She is alert and oriented to person, place, and time.  Skin: Skin is warm, dry and intact. No rash noted. She is not diaphoretic. No erythema. No pallor.  Psychiatric: She has a normal mood and affect. Her speech is normal and behavior is normal. Judgment and thought content normal. Cognition and memory are normal.  Nursing note and vitals reviewed.   Results for orders placed or performed during the hospital encounter of [id number]  Basic metabolic panel  Result Value Ref Range   Sodium 139 135 - 145 mmol/L   Potassium 4.2 3.5 - 5.1 mmol/L   Chloride 103 101 - 111 mmol/L   CO2 31 22 - 32 mmol/L   Glucose, Bld 91 65 - 99 mg/dL   BUN 19 6 - 20 mg/dL   Creatinine, Ser 0.69 0.44 - 1.00 mg/dL   Calcium 10.0 8.9 - 10.3 mg/dL   GFR calc non Af Amer >60 >60 mL/min   GFR calc Af Amer >60 >60 mL/min   Anion gap 5 5 - 15  CBC  Result Value Ref Range    WBC 5.2 3.6 - 11.0 K/uL   RBC 4.11 3.80 - 5.20 MIL/uL   Hemoglobin 12.1 12.0 - 16.0 g/dL   HCT 35.9 35.0 - 47.0 %   MCV 87.4 80.0 - 100.0 fL   MCH 29.5 26.0 - 34.0 pg   MCHC 33.7 32.0 - 36.0 g/dL   RDW 13.2 11.5 - 14.5 %   Platelets 180 150 - 440 K/uL      Assessment & Plan:   Problem List Items Addressed This Visit    None    Visit Diagnoses    Preoperative examination    -  Primary   Patient with normal labs and slightly abnormal EKG but with good exercise tolerance and completely asymptomatic. She is cleared for surgery.    Abnormal EKG       Completely asymptomatic. Will hold on sending to cardiology. Cleared for surgery.       Follow up plan: Return if symptoms worsen or fail to improve.

## 2017-01-25 NOTE — Pre-Procedure Instructions (Signed)
Medical clearance received from Dr. Mellody Memos , patient cleared at low risk.

## 2017-01-30 ENCOUNTER — Encounter
Admission: RE | Disposition: A | Discharge status: Home / Self Care | Payer: Self-pay | Source: Ambulatory Visit | Attending: Surgery

## 2017-01-30 ENCOUNTER — Ambulatory Visit: Payer: Medicare Other | Admitting: Anesthesiology

## 2017-01-30 ENCOUNTER — Encounter: Payer: Self-pay | Admitting: *Deleted

## 2017-01-30 ENCOUNTER — Ambulatory Visit
Admission: RE | Admit: 2017-01-30 | Discharge: 2017-01-30 | Disposition: A | Discharge status: Home / Self Care | Payer: Medicare Other | Source: Ambulatory Visit | Attending: Surgery | Admitting: Surgery

## 2017-01-30 DIAGNOSIS — M19011 Primary osteoarthritis, right shoulder: Secondary | ICD-10-CM | POA: Diagnosis not present

## 2017-01-30 DIAGNOSIS — Z87891 Personal history of nicotine dependence: Secondary | ICD-10-CM | POA: Diagnosis not present

## 2017-01-30 DIAGNOSIS — M7541 Impingement syndrome of right shoulder: Secondary | ICD-10-CM | POA: Insufficient documentation

## 2017-01-30 DIAGNOSIS — Z7982 Long term (current) use of aspirin: Secondary | ICD-10-CM | POA: Diagnosis not present

## 2017-01-30 DIAGNOSIS — E785 Hyperlipidemia, unspecified: Secondary | ICD-10-CM | POA: Diagnosis not present

## 2017-01-30 DIAGNOSIS — F419 Anxiety disorder, unspecified: Secondary | ICD-10-CM | POA: Insufficient documentation

## 2017-01-30 DIAGNOSIS — M75121 Complete rotator cuff tear or rupture of right shoulder, not specified as traumatic: Secondary | ICD-10-CM | POA: Insufficient documentation

## 2017-01-30 DIAGNOSIS — Z8551 Personal history of malignant neoplasm of bladder: Secondary | ICD-10-CM | POA: Diagnosis not present

## 2017-01-30 DIAGNOSIS — Z888 Allergy status to other drugs, medicaments and biological substances status: Secondary | ICD-10-CM | POA: Insufficient documentation

## 2017-01-30 DIAGNOSIS — M65811 Other synovitis and tenosynovitis, right shoulder: Secondary | ICD-10-CM | POA: Diagnosis not present

## 2017-01-30 DIAGNOSIS — I10 Essential (primary) hypertension: Secondary | ICD-10-CM | POA: Insufficient documentation

## 2017-01-30 DIAGNOSIS — M94211 Chondromalacia, right shoulder: Secondary | ICD-10-CM | POA: Insufficient documentation

## 2017-01-30 DIAGNOSIS — M25511 Pain in right shoulder: Secondary | ICD-10-CM | POA: Diagnosis not present

## 2017-01-30 DIAGNOSIS — G8918 Other acute postprocedural pain: Secondary | ICD-10-CM | POA: Diagnosis not present

## 2017-01-30 DIAGNOSIS — Z7984 Long term (current) use of oral hypoglycemic drugs: Secondary | ICD-10-CM | POA: Diagnosis not present

## 2017-01-30 DIAGNOSIS — E1151 Type 2 diabetes mellitus with diabetic peripheral angiopathy without gangrene: Secondary | ICD-10-CM | POA: Insufficient documentation

## 2017-01-30 DIAGNOSIS — M7521 Bicipital tendinitis, right shoulder: Secondary | ICD-10-CM | POA: Diagnosis not present

## 2017-01-30 DIAGNOSIS — M7581 Other shoulder lesions, right shoulder: Secondary | ICD-10-CM | POA: Diagnosis not present

## 2017-01-30 HISTORY — PX: SHOULDER ARTHROSCOPY WITH BICEPSTENOTOMY: SHX6204

## 2017-01-30 HISTORY — PX: SHOULDER ARTHROSCOPY WITH OPEN ROTATOR CUFF REPAIR: SHX6092

## 2017-01-30 LAB — GLUCOSE, CAPILLARY
GLUCOSE-CAPILLARY: 103 mg/dL — AB (ref 65–99)
GLUCOSE-CAPILLARY: 116 mg/dL — AB (ref 65–99)

## 2017-01-30 SURGERY — ARTHROSCOPY, SHOULDER WITH REPAIR, ROTATOR CUFF, OPEN
Anesthesia: General | Site: Shoulder | Laterality: Right | Wound class: Clean

## 2017-01-30 MED ORDER — FENTANYL CITRATE (PF) 250 MCG/5ML IJ SOLN
INTRAMUSCULAR | Status: AC
  Filled 2017-01-30: qty 5

## 2017-01-30 MED ORDER — ROCURONIUM BROMIDE 50 MG/5ML IV SOLN
INTRAVENOUS | Status: AC
  Filled 2017-01-30: qty 1

## 2017-01-30 MED ORDER — ACETAMINOPHEN 10 MG/ML IV SOLN
INTRAVENOUS | Status: AC
  Filled 2017-01-30: qty 100

## 2017-01-30 MED ORDER — ONDANSETRON HCL 4 MG/2ML IJ SOLN: 4.0000 mg | Freq: Four times a day (QID) | INTRAMUSCULAR | Status: DC | PRN

## 2017-01-30 MED ORDER — ACETAMINOPHEN 10 MG/ML IV SOLN
INTRAVENOUS | Status: DC | PRN
  Administered 2017-01-30: 1000 mg via INTRAVENOUS

## 2017-01-30 MED ORDER — MIDAZOLAM HCL 2 MG/2ML IJ SOLN
INTRAMUSCULAR | Status: AC
  Administered 2017-01-30: 2 mg via INTRAVENOUS
  Filled 2017-01-30: qty 2

## 2017-01-30 MED ORDER — GLYCOPYRROLATE 0.2 MG/ML IJ SOLN
INTRAMUSCULAR | Status: AC
  Filled 2017-01-30: qty 1

## 2017-01-30 MED ORDER — OXYCODONE HCL 5 MG PO TABS: 5.0000 mg | ORAL_TABLET | Freq: Once | ORAL | Status: DC | PRN

## 2017-01-30 MED ORDER — EPINEPHRINE PF 1 MG/ML IJ SOLN
INTRAMUSCULAR | Status: DC | PRN
  Administered 2017-01-30: 2 mL

## 2017-01-30 MED ORDER — LIDOCAINE HCL (PF) 2 % IJ SOLN
INTRAMUSCULAR | Status: AC
  Filled 2017-01-30: qty 2

## 2017-01-30 MED ORDER — SUGAMMADEX SODIUM 200 MG/2ML IV SOLN
INTRAVENOUS | Status: DC | PRN
  Administered 2017-01-30: 100 mg via INTRAVENOUS

## 2017-01-30 MED ORDER — FAMOTIDINE 20 MG PO TABS
20.0000 mg | ORAL_TABLET | Freq: Once | ORAL | Status: AC
  Administered 2017-01-30: 20 mg via ORAL

## 2017-01-30 MED ORDER — POTASSIUM CHLORIDE IN NACL 20-0.9 MEQ/L-% IV SOLN: INTRAVENOUS | Status: DC

## 2017-01-30 MED ORDER — METOCLOPRAMIDE HCL 10 MG PO TABS: 5.0000 mg | ORAL_TABLET | Freq: Three times a day (TID) | ORAL | Status: DC | PRN

## 2017-01-30 MED ORDER — METOCLOPRAMIDE HCL 5 MG/ML IJ SOLN: 5.0000 mg | Freq: Three times a day (TID) | INTRAMUSCULAR | Status: DC | PRN

## 2017-01-30 MED ORDER — GLYCOPYRROLATE 0.2 MG/ML IJ SOLN
INTRAMUSCULAR | Status: DC | PRN
  Administered 2017-01-30: 0.2 mg via INTRAVENOUS

## 2017-01-30 MED ORDER — ONDANSETRON HCL 4 MG PO TABS: 4.0000 mg | ORAL_TABLET | Freq: Four times a day (QID) | ORAL | Status: DC | PRN

## 2017-01-30 MED ORDER — FAMOTIDINE 20 MG PO TABS
ORAL_TABLET | ORAL | Status: AC
  Filled 2017-01-30: qty 1

## 2017-01-30 MED ORDER — LIDOCAINE HCL (PF) 1 % IJ SOLN
INTRAMUSCULAR | Status: AC
  Filled 2017-01-30: qty 5

## 2017-01-30 MED ORDER — CEFAZOLIN SODIUM-DEXTROSE 2-4 GM/100ML-% IV SOLN
2.0000 g | Freq: Once | INTRAVENOUS | Status: AC
Start: 2017-01-30 — End: 2017-01-30
  Administered 2017-01-30: 2 g via INTRAVENOUS

## 2017-01-30 MED ORDER — MIDAZOLAM HCL 2 MG/2ML IJ SOLN
2.0000 mg | Freq: Once | INTRAMUSCULAR | Status: AC
  Administered 2017-01-30: 2 mg via INTRAVENOUS

## 2017-01-30 MED ORDER — ROCURONIUM BROMIDE 100 MG/10ML IV SOLN
INTRAVENOUS | Status: DC | PRN
  Administered 2017-01-30: 50 mg via INTRAVENOUS

## 2017-01-30 MED ORDER — CEFAZOLIN SODIUM-DEXTROSE 2-4 GM/100ML-% IV SOLN
INTRAVENOUS | Status: AC
  Filled 2017-01-30: qty 100

## 2017-01-30 MED ORDER — PHENYLEPHRINE HCL 10 MG/ML IJ SOLN
INTRAVENOUS | Status: DC | PRN
  Administered 2017-01-30: 25 ug/min via INTRAVENOUS

## 2017-01-30 MED ORDER — ROPIVACAINE HCL 5 MG/ML IJ SOLN
INTRAMUSCULAR | Status: DC | PRN
  Administered 2017-01-30: 30 mL via PERINEURAL

## 2017-01-30 MED ORDER — OXYCODONE HCL 5 MG/5ML PO SOLN: 5.0000 mg | Freq: Once | ORAL | Status: DC | PRN

## 2017-01-30 MED ORDER — PROPOFOL 10 MG/ML IV BOLUS
INTRAVENOUS | Status: DC | PRN
  Administered 2017-01-30: 50 mg via INTRAVENOUS
  Administered 2017-01-30: 130 mg via INTRAVENOUS

## 2017-01-30 MED ORDER — LIDOCAINE HCL (PF) 1 % IJ SOLN
INTRAMUSCULAR | Status: DC | PRN
  Administered 2017-01-30: 3 mL
  Administered 2017-01-30: 100 mL

## 2017-01-30 MED ORDER — FENTANYL CITRATE (PF) 100 MCG/2ML IJ SOLN
INTRAMUSCULAR | Status: DC | PRN
  Administered 2017-01-30 (×2): 100 ug via INTRAVENOUS
  Administered 2017-01-30: 50 ug via INTRAVENOUS

## 2017-01-30 MED ORDER — EPHEDRINE SULFATE 50 MG/ML IJ SOLN
INTRAMUSCULAR | Status: DC | PRN
  Administered 2017-01-30: 15 mg via INTRAVENOUS
  Administered 2017-01-30: 5 mg via INTRAVENOUS
  Administered 2017-01-30 (×2): 10 mg via INTRAVENOUS
  Administered 2017-01-30: 5 mg via INTRAVENOUS

## 2017-01-30 MED ORDER — SODIUM CHLORIDE 0.9 % IV SOLN
INTRAVENOUS | Status: DC
  Administered 2017-01-30 (×2): via INTRAVENOUS

## 2017-01-30 MED ORDER — EPINEPHRINE PF 1 MG/ML IJ SOLN
INTRAMUSCULAR | Status: AC
  Filled 2017-01-30: qty 2

## 2017-01-30 MED ORDER — MEPERIDINE HCL 25 MG/ML IJ SOLN: 6.2500 mg | INTRAMUSCULAR | Status: DC | PRN

## 2017-01-30 MED ORDER — OXYCODONE HCL 5 MG PO TABS
5.0000 mg | ORAL_TABLET | ORAL | Status: DC | PRN
  Filled 2017-01-30: qty 2

## 2017-01-30 MED ORDER — DEXAMETHASONE SODIUM PHOSPHATE 4 MG/ML IJ SOLN
INTRAMUSCULAR | Status: DC | PRN
  Administered 2017-01-30: 5 mg via INTRAVENOUS

## 2017-01-30 MED ORDER — PROMETHAZINE HCL 25 MG/ML IJ SOLN: 6.2500 mg | INTRAMUSCULAR | Status: DC | PRN

## 2017-01-30 MED ORDER — BUPIVACAINE-EPINEPHRINE (PF) 0.5% -1:200000 IJ SOLN
INTRAMUSCULAR | Status: AC
  Filled 2017-01-30: qty 30

## 2017-01-30 MED ORDER — DEXAMETHASONE SODIUM PHOSPHATE 10 MG/ML IJ SOLN
INTRAMUSCULAR | Status: AC
  Filled 2017-01-30: qty 1

## 2017-01-30 MED ORDER — FENTANYL CITRATE (PF) 100 MCG/2ML IJ SOLN: 25.0000 ug | INTRAMUSCULAR | Status: DC | PRN

## 2017-01-30 MED ORDER — PROPOFOL 10 MG/ML IV BOLUS
INTRAVENOUS | Status: AC
  Filled 2017-01-30: qty 20

## 2017-01-30 MED ORDER — ONDANSETRON HCL 4 MG/2ML IJ SOLN
INTRAMUSCULAR | Status: DC | PRN
  Administered 2017-01-30: 4 mg via INTRAVENOUS

## 2017-01-30 MED ORDER — ROPIVACAINE HCL 5 MG/ML IJ SOLN
INTRAMUSCULAR | Status: AC
  Filled 2017-01-30: qty 40

## 2017-01-30 MED ORDER — BUPIVACAINE-EPINEPHRINE 0.5% -1:200000 IJ SOLN
INTRAMUSCULAR | Status: DC | PRN
  Administered 2017-01-30: 30 mL

## 2017-01-30 SURGICAL SUPPLY — 45 items
ANCHOR JUGGERKNOT WTAP NDL 2.9 (Anchor) ×12 IMPLANT
ANCHOR SUT QUATTRO KNTLS 4.5 (Anchor) ×6 IMPLANT
BIT DRILL JUGRKNT W/NDL BIT2.9 (DRILL) ×1 IMPLANT
BLADE FULL RADIUS 3.5 (BLADE) IMPLANT
BLADE SHAVER 4.5X7 STR FR (MISCELLANEOUS) ×3 IMPLANT
BUR ACROMIONIZER 4.0 (BURR) ×3 IMPLANT
CANNULA SHAVER 8MMX76MM (CANNULA) ×3 IMPLANT
CHLORAPREP W/TINT 26ML (MISCELLANEOUS) ×3 IMPLANT
COVER MAYO STAND STRL (DRAPES) ×3 IMPLANT
DRAPE IMP U-DRAPE 54X76 (DRAPES) ×6 IMPLANT
DRILL JUGGERKNOT W/NDL BIT 2.9 (DRILL) ×3
DRSG OPSITE POSTOP 4X8 (GAUZE/BANDAGES/DRESSINGS) ×3 IMPLANT
ELECT REM PT RETURN 9FT ADLT (ELECTROSURGICAL) ×3
ELECTRODE REM PT RTRN 9FT ADLT (ELECTROSURGICAL) ×1 IMPLANT
GAUZE PETRO XEROFOAM 1X8 (MISCELLANEOUS) ×3 IMPLANT
GAUZE SPONGE 4X4 12PLY STRL (GAUZE/BANDAGES/DRESSINGS) ×3 IMPLANT
GLOVE BIO SURGEON STRL SZ7.5 (GLOVE) ×6 IMPLANT
GLOVE BIO SURGEON STRL SZ8 (GLOVE) ×6 IMPLANT
GLOVE BIOGEL PI IND STRL 8 (GLOVE) ×1 IMPLANT
GLOVE BIOGEL PI INDICATOR 8 (GLOVE) ×2
GLOVE INDICATOR 8.0 STRL GRN (GLOVE) ×3 IMPLANT
GOWN STRL REUS W/ TWL LRG LVL3 (GOWN DISPOSABLE) ×1 IMPLANT
GOWN STRL REUS W/ TWL XL LVL3 (GOWN DISPOSABLE) ×1 IMPLANT
GOWN STRL REUS W/TWL LRG LVL3 (GOWN DISPOSABLE) ×2
GOWN STRL REUS W/TWL XL LVL3 (GOWN DISPOSABLE) ×2
GRASPER SUT 15 45D LOW PRO (SUTURE) IMPLANT
IV LACTATED RINGER IRRG 3000ML (IV SOLUTION) ×4
IV LR IRRIG 3000ML ARTHROMATIC (IV SOLUTION) ×2 IMPLANT
MANIFOLD NEPTUNE II (INSTRUMENTS) ×3 IMPLANT
MASK FACE SPIDER DISP (MASK) ×3 IMPLANT
MAT BLUE FLOOR 46X72 FLO (MISCELLANEOUS) ×3 IMPLANT
NEEDLE REVERSE CUT 1/2 CRC (NEEDLE) IMPLANT
PACK ARTHROSCOPY SHOULDER (MISCELLANEOUS) ×3 IMPLANT
SLING ARM LRG DEEP (SOFTGOODS) IMPLANT
SLING ULTRA II LG (MISCELLANEOUS) ×3 IMPLANT
STAPLER SKIN PROX 35W (STAPLE) ×3 IMPLANT
STRAP SAFETY BODY (MISCELLANEOUS) ×3 IMPLANT
SUT ETHIBOND 0 MO6 C/R (SUTURE) ×3 IMPLANT
SUT VIC AB 2-0 CT1 27 (SUTURE) ×4
SUT VIC AB 2-0 CT1 TAPERPNT 27 (SUTURE) ×2 IMPLANT
TAPE MICROFOAM 4IN (TAPE) ×3 IMPLANT
TUBING ARTHRO INFLOW-ONLY STRL (TUBING) ×3 IMPLANT
TUBING CONNECTING 10 (TUBING) ×2 IMPLANT
TUBING CONNECTING 10' (TUBING) ×1
WAND HAND CNTRL MULTIVAC 90 (MISCELLANEOUS) ×3 IMPLANT

## 2017-01-30 NOTE — Op Note (Signed)
01/30/2017  1:09 PM  Patient:   Stacy Mercado  Pre-Op Diagnosis:   Impingement/tendinopathy with large rotator cuff tear and degenerative joint disease of A-C joint, right shoulder.  Postoperative diagnosis: Impingement/tendinopathy with massive rotator cuff tear, degenerative joint disease of A-C joint, and biceps tendinopathy, right shoulder.  Procedure: Limited arthroscopic debridement, arthroscopic subacromial decompression, arthroscopic distal clavicle excision, mini-open rotator cuff repair, and biceps tenolysis, right shoulder.  Anesthesia: General endotracheal with interscalene block placed preoperatively by the anesthesiologist.  Surgeon:   Pascal Lux, MD  Assistant:   Cameron Proud, PA-C; Ilda Foil, PA-S  Findings: As above. There was a massive rotator cuff tear involving the entire supraspinatus, much of the infraspinatus, and most of the subscapularis tendons. The biceps tendon demonstrated significant tendinopathic changes with near complete tearing of the intra-articular portion. There was moderate fraying of the labrum anteriorly and superiorly, as well as grade 3 chondromalacial changes involving the humeral head and glenoid.  Complications: None  Fluids:   1000 cc  Estimated blood loss: 10 cc  Tourniquet time: None  Drains: None  Closure: Staples   Brief clinical note: The patient is a 71 year old female with a 9 month history of right shoulder pain following a fall. The patient's symptoms have progressed despite medications, activity modification, etc. The patient's history and examination are consistent with impingement/tendinopathy with a large rotator cuff tear. These findings were confirmed by MRI scan. The patient presents at this time for definitive management of these shoulder symptoms.  Procedure: The patient underwent placement of an interscalene block by the anesthesiologist in the preoperative holding area before she  was brought into the operating room and lain in the supine position. The patient then underwent general endotracheal intubation and anesthesia before being repositioned in the beach chair position using the beach chair positioner. The right shoulder and upper extremity were prepped with ChloraPrep solution before being draped sterilely. Preoperative antibiotics were administered. A timeout was performed to confirm the proper surgical site before the expected portal sites and incision site were injected with 0.5% Sensorcaine with epinephrine. A posterior portal was created and the glenohumeral joint thoroughly inspected with the findings as described above. An anterior portal was created using an outside-in technique. The labrum and rotator cuff were further probed, again confirming the above-noted findings. The areas of labral fraying and areas of synovitis were debrided back to stable margins using the full-radius resector. The ArthroCare wand was inserted and used to obtain hemostasis as well as to "anneal" the labrum superiorly and anteriorly. The ArthroCare wand also was used to release the biceps tendon from its labral attachment. The instruments were removed from the joint after suctioning the excess fluid.  The camera was repositioned through the posterior portal into the subacromial space. A separate lateral portal was created using an outside-in technique. The 3.5 mm full-radius resector was introduced and used to perform a subtotal bursectomy. The ArthroCare wand was then inserted and used to remove the periosteal tissue off the undersurface of the anterior third of the acromion as well as to recess the coracoacromial ligament from its attachment along the anterior and lateral margins of the acromion. The 4.0 mm acromionizing bur was introduced and used to complete the decompression by removing the undersurface of the anterior third of the acromion.   The ArthroCare wand was used to debride the  periosteal tissues off the inferior portion of the before meals joint as well as the anterior, inferior, and posterior portions of the distal  clavicle. The bur was reintroduced and advanced medially to remove the undersurface of the distal portion of the clavicle. The camera was repositioned in the lateral portal and the 4 mm acromionizing bur introduced through the anterior portal in order to complete the debridement of the distal clavicle. Approximately 8-10 mm of the distal clavicle was removed. The adequacy of free section was verified arthroscopically from both the lateral and anterior portal sites. The ArthroCare wand was reintroduced to obtain hemostasis before the instruments were removed from the subacromial space after suctioning the excess fluid.  An approximately 4-5 cm incision was made over the anterolateral aspect of the shoulder beginning at the anterolateral corner of the acromion and extending distally in line with the bicipital groove. This incision was carried down through the subcutaneous tissues to expose the deltoid fascia. The raphae between the anterior and middle thirds was identified and this plane developed to provide access into the subacromial space. Additional bursal tissues were debrided sharply using Metzenbaum scissors. The rotator cuff tear was readily identified. Immediately, it was noted that it was larger than suggested by the MRI scan. The subscapularis tendon was mobilized after placing 2 #0 Ethibond tagging sutures into the margin. The lesser tuberosity was roughened with a rongeur before two Biomet 2.9 mm JuggerKnot anchors were placed into the lesser tuberosity. All sutures from both anchors were passed through the lateral margin of the subscapularis tendon and tied securely to effect this repair. Superiorly, the large L-shaped tear involving the supraspinatus and infraspinatus tendons was mobilized after placing 2 #0 Ethibond interrupted sutures to secure the tendon. The  exposed greater tuberosity was roughened with a rongeur before the tear was repaired using two additional Biomet 2.9 mm JuggerKnot anchors. Most of the sutures from the four Biomet 2.9 mm JuggerKnot anchors were then brought back laterally and secured using two [place] QuatroLink anchors to create a two-layer closure. An additional #0 Ethibond interrupted sutures were placed in a side-to-side fashion to close the lateral portion of the rotator interval. An apparent watertight closure was obtained.  The wound was copiously irrigated with sterile saline solution before the deltoid raphae was reapproximated using 2-0 Vicryl interrupted sutures. The subcutaneous tissues were closed in two layers using 2-0 Vicryl interrupted sutures before the skin was closed using staples. The portal sites also were closed using staples. A sterile bulky dressing was applied to the shoulder before the arm was placed into a shoulder immobilizer. The patient was then awakened, extubated, and returned to the recovery room in satisfactory condition after tolerating the procedure well.

## 2017-01-30 NOTE — Discharge Instructions (Addendum)
Keep dressing dry and intact.  May remove dressing on post-op day #4 (Saturday) and sponge bathe with soap and water.  Cover staples with Band-Aids after drying off. Apply ice frequently to shoulder. Take ibuprofen 600 mg TID with meals for 7-10 days, then as necessary. Take Tramadol as prescribed when needed.  May supplement with ES Tylenol if necessary. Keep shoulder immobilizer on at all times except may remove for bathing purposes. Follow-up in 10-14 days or as scheduled.   AMBULATORY SURGERY  DISCHARGE INSTRUCTIONS   1) The drugs that you were given will stay in your system until tomorrow so for the next 24 hours you should not:  A) Drive an automobile B) Make any legal decisions C) Drink any alcoholic beverage   2) You may resume regular meals tomorrow.  Today it is better to start with liquids and gradually work up to solid foods.  You may eat anything you prefer, but it is better to start with liquids, then soup and crackers, and gradually work up to solid foods.   3) Please notify your doctor immediately if you have any unusual bleeding, trouble breathing, redness and pain at the surgery site, drainage, fever, or pain not relieved by medication.    4) Additional Instructions:        Please contact your physician with any problems or Same Day Surgery at [phone], Monday through Friday 6 am to 4 pm, or [place] at [facility] number at [phone].

## 2017-01-30 NOTE — Anesthesia Procedure Notes (Signed)
Procedure Name: Intubation Date/Time: 01/30/2017 11:09 AM Performed by: Rosaria Ferries, Cathye Kreiter Pre-anesthesia Checklist: Patient identified Patient Re-evaluated:Patient Re-evaluated prior to inductionOxygen Delivery Method: Circle system utilized Preoxygenation: Pre-oxygenation with 100% oxygen Intubation Type: IV induction Laryngoscope Size: Mac and 4 Grade View: Grade I Tube size: 7.0 mm Number of attempts: 1 Airway Equipment and Method: LTA kit utilized Placement Confirmation: breath sounds checked- equal and bilateral,  ETT inserted through vocal cords under direct vision and positive ETCO2 Secured at: 21 cm Tube secured with: Tape Dental Injury: Teeth and Oropharynx as per pre-operative assessment

## 2017-01-30 NOTE — Transfer of Care (Signed)
Immediate Anesthesia Transfer of Care Note  Patient: [name]  Procedure(s) Performed: Procedure(s) with comments: SHOULDER ARTHROSCOPY WITH OPEN ROTATOR CUFF REPAIR (Right) - Massive rotator cuff tear repair SHOULDER ARTHROSCOPY WITH BICEPSTENOTOMY (Right) - biceps tenolysis SHOULDER ARTHROSCOPY WITH SUBACROMIAL DECOMPRESSION AND DISTAL CLAVICLE EXCISION (Right)  Patient Location: PACU  Anesthesia Type:General  Level of Consciousness: awake and patient cooperative  Airway & Oxygen Therapy: Patient Spontanous Breathing and Patient connected to nasal cannula oxygen  Post-op Assessment: Report given to RN and Post -op Vital signs reviewed and stable  Post vital signs: Reviewed and stable  Last Vitals:  Vitals:   01/30/17 1053 01/30/17 1324  BP: 128/64 (!) 126/59  Pulse: (!) 57 78  Resp: 11 12  Temp:  36.2 C    Last Pain:  Vitals:   01/30/17 0906  TempSrc: Tympanic  PainSc: 4          Complications: No apparent anesthesia complications

## 2017-01-30 NOTE — Anesthesia Preprocedure Evaluation (Signed)
Anesthesia Evaluation  Patient identified by MRN, date of birth, ID band Patient awake    Reviewed: Allergy & Precautions, NPO status , Patient's Chart, lab work & pertinent test results  History of Anesthesia Complications Negative for: history of anesthetic complications  Airway Mallampati: III  TM Distance: >3 FB Neck ROM: Full    Dental  (+) Upper Dentures   Pulmonary neg sleep apnea, neg COPD, former smoker,    breath sounds clear to auscultation- rhonchi (-) wheezing      Cardiovascular Exercise Tolerance: Good hypertension, Pt. on medications (-) CAD and (-) Past MI  Rhythm:Regular Rate:Normal - Systolic murmurs and - Diastolic murmurs    Neuro/Psych PSYCHIATRIC DISORDERS Depression negative neurological ROS     GI/Hepatic negative GI ROS, Neg liver ROS,   Endo/Other  diabetes, Oral Hypoglycemic Agents  Renal/GU negative Renal ROS     Musculoskeletal  (+) Arthritis ,   Abdominal (+) - obese,   Peds  Hematology negative hematology ROS (+)   Anesthesia Other Findings Past Medical History: No date: Bladder cancer ([facility]) No date: Chronic low back pain No date: Depression No date: Diabetes mellitus without complication ([place]) [id number]: Hyperlipidemia No date: Hypertension No date: PVD (peripheral vascular disease) ([place]) 08/07/2016: Urge incontinence of urine   Reproductive/Obstetrics                             Anesthesia Physical Anesthesia Plan  ASA: III  Anesthesia Plan: General   Post-op Pain Management:  Regional for Post-op pain   Induction: Intravenous  Airway Management Planned: Oral ETT  Additional Equipment:   Intra-op Plan:   Post-operative Plan: Extubation in OR  Informed Consent: I have reviewed the patients History and Physical, chart, labs and discussed the procedure including the risks, benefits and alternatives for the proposed anesthesia with the  patient or authorized representative who has indicated his/her understanding and acceptance.   Dental advisory given  Plan Discussed with: CRNA and Anesthesiologist  Anesthesia Plan Comments:         Anesthesia Quick Evaluation

## 2017-01-30 NOTE — Anesthesia Post-op Follow-up Note (Cosign Needed)
Anesthesia QCDR form completed.        

## 2017-01-30 NOTE — Anesthesia Postprocedure Evaluation (Signed)
Anesthesia Post Note  Patient: Stacy Mercado  Procedure(s) Performed: Procedure(s) (LRB): SHOULDER ARTHROSCOPY WITH OPEN ROTATOR CUFF REPAIR (Right) SHOULDER ARTHROSCOPY WITH BICEPSTENOTOMY (Right) SHOULDER ARTHROSCOPY WITH SUBACROMIAL DECOMPRESSION AND DISTAL CLAVICLE EXCISION (Right)  Patient location during evaluation: PACU Anesthesia Type: General Level of consciousness: awake and alert and oriented Pain management: pain level controlled Vital Signs Assessment: post-procedure vital signs reviewed and stable Respiratory status: spontaneous breathing, nonlabored ventilation and respiratory function stable Cardiovascular status: blood pressure returned to baseline and stable Postop Assessment: no signs of nausea or vomiting Anesthetic complications: no     Last Vitals:  Vitals:   01/30/17 1354 01/30/17 1407  BP: (!) 119/55 (!) 118/57  Pulse: 77 76  Resp: 16 18  Temp:  36.3 C    Last Pain:  Vitals:   01/30/17 1407  TempSrc:   PainSc: 0-No pain                 Tanesha Arambula

## 2017-01-30 NOTE — OR Nursing (Signed)
To pacu bay 12 for block (via stretcher @0942 )

## 2017-01-30 NOTE — Anesthesia Procedure Notes (Signed)
Anesthesia Regional Block:  Interscalene brachial plexus block  Pre-Anesthetic Checklist: ,, timeout performed, Correct Patient, Correct Site, Correct Laterality, Correct Procedure, Correct Position, site marked, Risks and benefits discussed,  Surgical consent,  Pre-op evaluation,  At surgeon's request and post-op pain management  Laterality: Right  Prep: chloraprep       Needles:  Injection technique: Single-shot  Needle Type: Stimiplex     Needle Length: 10cm 10 cm Needle Gauge: 20 and 20 G    Additional Needles:  Procedures: ultrasound guided (picture in chart) Interscalene brachial plexus block Narrative:  Start time: 01/30/2017 10:00 AM End time: 01/30/2017 10:09 AM Injection made incrementally with aspirations every 5 mL.  Performed by: Personally  Anesthesiologist: Lyndzie Zentz  Additional Notes: Functioning IV was confirmed and monitors were applied.  A 24mm 22ga Stimuplex needle was used. Sterile prep and drape,hand hygiene and sterile gloves were used.  Negative aspiration and negative test dose prior to incremental administration of local anesthetic. The patient tolerated the procedure well.

## 2017-01-30 NOTE — H&P (Signed)
Paper H&P to be scanned into permanent record. H&P reviewed. No changes. 

## 2017-01-31 ENCOUNTER — Other Ambulatory Visit: Payer: Self-pay | Admitting: Family Medicine

## 2017-02-05 ENCOUNTER — Other Ambulatory Visit: Payer: Self-pay | Admitting: Family Medicine

## 2017-02-05 DIAGNOSIS — M6281 Muscle weakness (generalized): Secondary | ICD-10-CM | POA: Diagnosis not present

## 2017-02-05 DIAGNOSIS — Z9889 Other specified postprocedural states: Secondary | ICD-10-CM | POA: Diagnosis not present

## 2017-02-05 DIAGNOSIS — M25511 Pain in right shoulder: Secondary | ICD-10-CM | POA: Diagnosis not present

## 2017-02-05 DIAGNOSIS — M25611 Stiffness of right shoulder, not elsewhere classified: Secondary | ICD-10-CM | POA: Diagnosis not present

## 2017-03-05 ENCOUNTER — Other Ambulatory Visit: Payer: Self-pay | Admitting: Unknown Physician Specialty

## 2017-03-12 DIAGNOSIS — M25611 Stiffness of right shoulder, not elsewhere classified: Secondary | ICD-10-CM | POA: Diagnosis not present

## 2017-03-12 DIAGNOSIS — M25511 Pain in right shoulder: Secondary | ICD-10-CM | POA: Diagnosis not present

## 2017-03-12 DIAGNOSIS — Z9889 Other specified postprocedural states: Secondary | ICD-10-CM | POA: Diagnosis not present

## 2017-03-12 DIAGNOSIS — M6281 Muscle weakness (generalized): Secondary | ICD-10-CM | POA: Diagnosis not present

## 2017-03-13 ENCOUNTER — Ambulatory Visit (INDEPENDENT_AMBULATORY_CARE_PROVIDER_SITE_OTHER): Payer: Medicare Other | Admitting: Family Medicine

## 2017-03-13 ENCOUNTER — Encounter: Payer: Self-pay | Admitting: Family Medicine

## 2017-03-13 VITALS — BP 124/52 | HR 62 | Temp 98.6°F | Resp 17 | Ht 65.0 in | Wt 161.0 lb

## 2017-03-13 DIAGNOSIS — F3341 Major depressive disorder, recurrent, in partial remission: Secondary | ICD-10-CM | POA: Diagnosis not present

## 2017-03-13 DIAGNOSIS — Z780 Asymptomatic menopausal state: Secondary | ICD-10-CM | POA: Diagnosis not present

## 2017-03-13 DIAGNOSIS — N390 Urinary tract infection, site not specified: Secondary | ICD-10-CM

## 2017-03-13 DIAGNOSIS — Z7189 Other specified counseling: Secondary | ICD-10-CM

## 2017-03-13 DIAGNOSIS — M7531 Calcific tendinitis of right shoulder: Secondary | ICD-10-CM | POA: Diagnosis not present

## 2017-03-13 DIAGNOSIS — E8941 Symptomatic postprocedural ovarian failure: Secondary | ICD-10-CM

## 2017-03-13 DIAGNOSIS — Z1239 Encounter for other screening for malignant neoplasm of breast: Secondary | ICD-10-CM

## 2017-03-13 DIAGNOSIS — Z1231 Encounter for screening mammogram for malignant neoplasm of breast: Secondary | ICD-10-CM | POA: Diagnosis not present

## 2017-03-13 DIAGNOSIS — C679 Malignant neoplasm of bladder, unspecified: Secondary | ICD-10-CM

## 2017-03-13 DIAGNOSIS — R8281 Pyuria: Secondary | ICD-10-CM

## 2017-03-13 DIAGNOSIS — I739 Peripheral vascular disease, unspecified: Secondary | ICD-10-CM

## 2017-03-13 DIAGNOSIS — E782 Mixed hyperlipidemia: Secondary | ICD-10-CM | POA: Diagnosis not present

## 2017-03-13 DIAGNOSIS — Z0000 Encounter for general adult medical examination without abnormal findings: Secondary | ICD-10-CM | POA: Diagnosis not present

## 2017-03-13 DIAGNOSIS — I129 Hypertensive chronic kidney disease with stage 1 through stage 4 chronic kidney disease, or unspecified chronic kidney disease: Secondary | ICD-10-CM

## 2017-03-13 DIAGNOSIS — E119 Type 2 diabetes mellitus without complications: Secondary | ICD-10-CM

## 2017-03-13 DIAGNOSIS — Z1211 Encounter for screening for malignant neoplasm of colon: Secondary | ICD-10-CM | POA: Diagnosis not present

## 2017-03-13 MED ORDER — LOSARTAN POTASSIUM 50 MG PO TABS: ORAL_TABLET | ORAL | 1 refills | Status: DC

## 2017-03-13 MED ORDER — BACLOFEN 10 MG PO TABS: 10.0000 mg | ORAL_TABLET | Freq: Two times a day (BID) | ORAL | 1 refills | Status: DC

## 2017-03-13 MED ORDER — GABAPENTIN 100 MG PO CAPS: ORAL_CAPSULE | ORAL | 3 refills | Status: DC

## 2017-03-13 MED ORDER — METFORMIN HCL 1000 MG PO TABS: 1000.0000 mg | ORAL_TABLET | Freq: Two times a day (BID) | ORAL | 1 refills | Status: DC

## 2017-03-13 MED ORDER — ESCITALOPRAM OXALATE 10 MG PO TABS: ORAL_TABLET | ORAL | 1 refills | Status: DC

## 2017-03-13 NOTE — Assessment & Plan Note (Signed)
Continue to follow with orthopedics. S/P surgery. Call with any concerns.

## 2017-03-13 NOTE — Assessment & Plan Note (Signed)
Rechecking levels today. Await results and treat as needed.  

## 2017-03-13 NOTE — Assessment & Plan Note (Signed)
Under good control. Continue current regimen, Continue to monitor.

## 2017-03-13 NOTE — Assessment & Plan Note (Signed)
Rechecking levels today. Continue current regimen. Continue to monitor. Call with any concerns.  

## 2017-03-13 NOTE — Patient Instructions (Addendum)
Preventative Services:  Health Risk Assessment and Personalized Prevention Plan: Done today Bone Mass Measurements: Ordered today Breast Cancer Screening: Ordered today CVD Screening: Done today Cervical Cancer Screening: N/A Colon Cancer Screening: Cologuard ordered today Depression Screening: Done today Diabetes Screening: Done today Glaucoma Screening: See your Eye doctor Hepatitis B vaccine: N/A Hepatitis C screening: up to date HIV Screening:Up to date Flu Vaccine: up to date Lung cancer Screening: Ordered today Obesity Screening: Done today Pneumonia Vaccines (2): Due for 2nd in October STI Screening: N/A Health Maintenance, Female Adopting a healthy lifestyle and getting preventive care can go a long way to promote health and wellness. Talk with your health care provider about what schedule of regular examinations is right for you. This is a good chance for you to check in with your provider about disease prevention and staying healthy. In between checkups, there are plenty of things you can do on your own. Experts have done a lot of research about which lifestyle changes and preventive measures are most likely to keep you healthy. Ask your health care provider for more information. Weight and diet Eat a healthy diet  Be sure to include plenty of vegetables, fruits, low-fat dairy products, and lean protein.  Do not eat a lot of foods high in solid fats, added sugars, or salt.  Get regular exercise. This is one of the most important things you can do for your health.  Most adults should exercise for at least 150 minutes each week. The exercise should increase your heart rate and make you sweat (moderate-intensity exercise).  Most adults should also do strengthening exercises at least twice a week. This is in addition to the moderate-intensity exercise. Maintain a healthy weight  Body mass index (BMI) is a measurement that can be used to identify possible weight problems. It  estimates body fat based on height and weight. Your health care provider can help determine your BMI and help you achieve or maintain a healthy weight.  For females 71 years of age and older:  A BMI below 18.5 is considered underweight.  A BMI of 18.5 to 24.9 is normal.  A BMI of 25 to 29.9 is considered overweight.  A BMI of 30 and above is considered obese. Watch levels of cholesterol and blood lipids  You should start having your blood tested for lipids and cholesterol at 73 years of age, then have this test every 5 years.  You may need to have your cholesterol levels checked more often if:  Your lipid or cholesterol levels are high.  You are older than 73 years of age.  You are at high risk for heart disease. Cancer screening Lung Cancer  Lung cancer screening is recommended for adults 32-2 years old who are at high risk for lung cancer because of a history of smoking.  A yearly low-dose CT scan of the lungs is recommended for people who:  Currently smoke.  Have quit within the past 15 years.  Have at least a 30-pack-year history of smoking. A pack year is smoking an average of one pack of cigarettes a day for 1 year.  Yearly screening should continue until it has been 15 years since you quit.  Yearly screening should stop if you develop a health problem that would prevent you from having lung cancer treatment. Breast Cancer  Practice breast self-awareness. This means understanding how your breasts normally appear and feel.  It also means doing regular breast self-exams. Let your health care provider know about  any changes, no matter how small.  If you are in your 20s or 30s, you should have a clinical breast exam (CBE) by a health care provider every 1-3 years as part of a regular health exam.  If you are 92 or older, have a CBE every year. Also consider having a breast X-ray (mammogram) every year.  If you have a family history of breast cancer, talk to your  health care provider about genetic screening.  If you are at high risk for breast cancer, talk to your health care provider about having an MRI and a mammogram every year.  Breast cancer gene (BRCA) assessment is recommended for women who have family members with BRCA-related cancers. BRCA-related cancers include:  Breast.  Ovarian.  Tubal.  Peritoneal cancers.  Results of the assessment will determine the need for genetic counseling and BRCA1 and BRCA2 testing. Cervical Cancer  Your health care provider may recommend that you be screened regularly for cancer of the pelvic organs (ovaries, uterus, and vagina). This screening involves a pelvic examination, including checking for microscopic changes to the surface of your cervix (Pap test). You may be encouraged to have this screening done every 3 years, beginning at age 94.  For women ages 55-65, health care providers may recommend pelvic exams and Pap testing every 3 years, or they may recommend the Pap and pelvic exam, combined with testing for human papilloma virus (HPV), every 5 years. Some types of HPV increase your risk of cervical cancer. Testing for HPV may also be done on women of any age with unclear Pap test results.  Other health care providers may not recommend any screening for nonpregnant women who are considered low risk for pelvic cancer and who do not have symptoms. Ask your health care provider if a screening pelvic exam is right for you.  If you have had past treatment for cervical cancer or a condition that could lead to cancer, you need Pap tests and screening for cancer for at least 20 years after your treatment. If Pap tests have been discontinued, your risk factors (such as having a new sexual partner) need to be reassessed to determine if screening should resume. Some women have medical problems that increase the chance of getting cervical cancer. In these cases, your health care provider may recommend more frequent  screening and Pap tests. Colorectal Cancer  This type of cancer can be detected and often prevented.  Routine colorectal cancer screening usually begins at 72 years of age and continues through 73 years of age.  Your health care provider may recommend screening at an earlier age if you have risk factors for colon cancer.  Your health care provider may also recommend using home test kits to check for hidden blood in the stool.  A small camera at the end of a tube can be used to examine your colon directly (sigmoidoscopy or colonoscopy). This is done to check for the earliest forms of colorectal cancer.  Routine screening usually begins at age 77.  Direct examination of the colon should be repeated every 5-10 years through 73 years of age. However, you may need to be screened more often if early forms of precancerous polyps or small growths are found. Skin Cancer  Check your skin from head to toe regularly.  Tell your health care provider about any new moles or changes in moles, especially if there is a change in a mole's shape or color.  Also tell your health care provider if you  have a mole that is larger than the size of a pencil eraser.  Always use sunscreen. Apply sunscreen liberally and repeatedly throughout the day.  Protect yourself by wearing long sleeves, pants, a wide-brimmed hat, and sunglasses whenever you are outside. Heart disease, diabetes, and high blood pressure  High blood pressure causes heart disease and increases the risk of stroke. High blood pressure is more likely to develop in:  People who have blood pressure in the high end of the normal range (130-139/85-89 mm Hg).  People who are overweight or obese.  People who are African American.  If you are 79-18 years of age, have your blood pressure checked every 3-5 years. If you are 95 years of age or older, have your blood pressure checked every year. You should have your blood pressure measured twice-once  when you are at a hospital or clinic, and once when you are not at a hospital or clinic. Record the average of the two measurements. To check your blood pressure when you are not at a hospital or clinic, you can use:  An automated blood pressure machine at a pharmacy.  A home blood pressure monitor.  If you are between 74 years and 34 years old, ask your health care provider if you should take aspirin to prevent strokes.  Have regular diabetes screenings. This involves taking a blood sample to check your fasting blood sugar level.  If you are at a normal weight and have a low risk for diabetes, have this test once every three years after 73 years of age.  If you are overweight and have a high risk for diabetes, consider being tested at a younger age or more often. Preventing infection Hepatitis B  If you have a higher risk for hepatitis B, you should be screened for this virus. You are considered at high risk for hepatitis B if:  You were born in a country where hepatitis B is common. Ask your health care provider which countries are considered high risk.  Your parents were born in a high-risk country, and you have not been immunized against hepatitis B (hepatitis B vaccine).  You have HIV or AIDS.  You use needles to inject street drugs.  You live with someone who has hepatitis B.  You have had sex with someone who has hepatitis B.  You get hemodialysis treatment.  You take certain medicines for conditions, including cancer, organ transplantation, and autoimmune conditions. Hepatitis C  Blood testing is recommended for:  Everyone born from [birth year] through 1967.  Anyone with known risk factors for hepatitis C. Sexually transmitted infections (STIs)  You should be screened for sexually transmitted infections (STIs) including gonorrhea and chlamydia if:  You are sexually active and are younger than 73 years of age.  You are older than 73 years of age and your health care  provider tells you that you are at risk for this type of infection.  Your sexual activity has changed since you were last screened and you are at an increased risk for chlamydia or gonorrhea. Ask your health care provider if you are at risk.  If you do not have HIV, but are at risk, it may be recommended that you take a prescription medicine daily to prevent HIV infection. This is called pre-exposure prophylaxis (PrEP). You are considered at risk if:  You are sexually active and do not regularly use condoms or know the HIV status of your partner(s).  You take drugs by injection.  You are  sexually active with a partner who has HIV. Talk with your health care provider about whether you are at high risk of being infected with HIV. If you choose to begin PrEP, you should first be tested for HIV. You should then be tested every 3 months for as long as you are taking PrEP. Pregnancy  If you are premenopausal and you may become pregnant, ask your health care provider about preconception counseling.  If you may become pregnant, take 400 to 800 micrograms (mcg) of folic acid every day.  If you want to prevent pregnancy, talk to your health care provider about birth control (contraception). Osteoporosis and menopause  Osteoporosis is a disease in which the bones lose minerals and strength with aging. This can result in serious bone fractures. Your risk for osteoporosis can be identified using a bone density scan.  If you are 8 years of age or older, or if you are at risk for osteoporosis and fractures, ask your health care provider if you should be screened.  Ask your health care provider whether you should take a calcium or vitamin D supplement to lower your risk for osteoporosis.  Menopause may have certain physical symptoms and risks.  Hormone replacement therapy may reduce some of these symptoms and risks. Talk to your health care provider about whether hormone replacement therapy is right  for you. Follow these instructions at home:  Schedule regular health, dental, and eye exams.  Stay current with your immunizations.  Do not use any tobacco products including cigarettes, chewing tobacco, or electronic cigarettes.  If you are pregnant, do not drink alcohol.  If you are breastfeeding, limit how much and how often you drink alcohol.  Limit alcohol intake to no more than 1 drink per day for nonpregnant women. One drink equals 12 ounces of beer, 5 ounces of wine, or 1 ounces of hard liquor.  Do not use street drugs.  Do not share needles.  Ask your health care provider for help if you need support or information about quitting drugs.  Tell your health care provider if you often feel depressed.  Tell your health care provider if you have ever been abused or do not feel safe at home. This information is not intended to replace advice given to you by your health care provider. Make sure you discuss any questions you have with your health care provider. Document Released: 06/19/2011 Document Revised: 05/11/2016 Document Reviewed: 09/07/2015 Elsevier Interactive Patient Education  2017 [place] Maintenance for Postmenopausal Women Menopause is a normal process in which your reproductive ability comes to an end. This process happens gradually over a span of months to years, usually between the ages of 62 and 31. Menopause is complete when you have missed 12 consecutive menstrual periods. It is important to talk with your health care provider about some of the most common conditions that affect postmenopausal women, such as heart disease, cancer, and bone loss (osteoporosis). Adopting a healthy lifestyle and getting preventive care can help to promote your health and wellness. Those actions can also lower your chances of developing some of these common conditions. What should I know about menopause? During menopause, you may experience a number of symptoms, such  as:  Moderate-to-severe hot flashes.  Night sweats.  Decrease in sex drive.  Mood swings.  Headaches.  Tiredness.  Irritability.  Memory problems.  Insomnia. Choosing to treat or not to treat menopausal changes is an individual decision that you make with your health care  provider. What should I know about hormone replacement therapy and supplements? Hormone therapy products are effective for treating symptoms that are associated with menopause, such as hot flashes and night sweats. Hormone replacement carries certain risks, especially as you become older. If you are thinking about using estrogen or estrogen with progestin treatments, discuss the benefits and risks with your health care provider. What should I know about heart disease and stroke? Heart disease, heart attack, and stroke become more likely as you age. This may be due, in part, to the hormonal changes that your body experiences during menopause. These can affect how your body processes dietary fats, triglycerides, and cholesterol. Heart attack and stroke are both medical emergencies. There are many things that you can do to help prevent heart disease and stroke:  Have your blood pressure checked at least every 1-2 years. High blood pressure causes heart disease and increases the risk of stroke.  If you are 57-2 years old, ask your health care provider if you should take aspirin to prevent a heart attack or a stroke.  Do not use any tobacco products, including cigarettes, chewing tobacco, or electronic cigarettes. If you need help quitting, ask your health care provider.  It is important to eat a healthy diet and maintain a healthy weight.  Be sure to include plenty of vegetables, fruits, low-fat dairy products, and lean protein.  Avoid eating foods that are high in solid fats, added sugars, or salt (sodium).  Get regular exercise. This is one of the most important things that you can do for your health.  Try to  exercise for at least 150 minutes each week. The type of exercise that you do should increase your heart rate and make you sweat. This is known as moderate-intensity exercise.  Try to do strengthening exercises at least twice each week. Do these in addition to the moderate-intensity exercise.  Know your numbers.Ask your health care provider to check your cholesterol and your blood glucose. Continue to have your blood tested as directed by your health care provider. What should I know about cancer screening? There are several types of cancer. Take the following steps to reduce your risk and to catch any cancer development as early as possible. Breast Cancer  Practice breast self-awareness.  This means understanding how your breasts normally appear and feel.  It also means doing regular breast self-exams. Let your health care provider know about any changes, no matter how small.  If you are 88 or older, have a clinician do a breast exam (clinical breast exam or CBE) every year. Depending on your age, family history, and medical history, it may be recommended that you also have a yearly breast X-ray (mammogram).  If you have a family history of breast cancer, talk with your health care provider about genetic screening.  If you are at high risk for breast cancer, talk with your health care provider about having an MRI and a mammogram every year.  Breast cancer (BRCA) gene test is recommended for women who have family members with BRCA-related cancers. Results of the assessment will determine the need for genetic counseling and BRCA1 and for BRCA2 testing. BRCA-related cancers include these types:  Breast. This occurs in males or females.  Ovarian.  Tubal. This may also be called fallopian tube cancer.  Cancer of the abdominal or pelvic lining (peritoneal cancer).  Prostate.  Pancreatic. Cervical, Uterine, and Ovarian Cancer  Your health care provider may recommend that you be screened  regularly for  cancer of the pelvic organs. These include your ovaries, uterus, and vagina. This screening involves a pelvic exam, which includes checking for microscopic changes to the surface of your cervix (Pap test).  For women ages 21-65, health care providers may recommend a pelvic exam and a Pap test every three years. For women ages 39-65, they may recommend the Pap test and pelvic exam, combined with testing for human papilloma virus (HPV), every five years. Some types of HPV increase your risk of cervical cancer. Testing for HPV may also be done on women of any age who have unclear Pap test results.  Other health care providers may not recommend any screening for nonpregnant women who are considered low risk for pelvic cancer and have no symptoms. Ask your health care provider if a screening pelvic exam is right for you.  If you have had past treatment for cervical cancer or a condition that could lead to cancer, you need Pap tests and screening for cancer for at least 20 years after your treatment. If Pap tests have been discontinued for you, your risk factors (such as having a new sexual partner) need to be reassessed to determine if you should start having screenings again. Some women have medical problems that increase the chance of getting cervical cancer. In these cases, your health care provider may recommend that you have screening and Pap tests more often.  If you have a family history of uterine cancer or ovarian cancer, talk with your health care provider about genetic screening.  If you have vaginal bleeding after reaching menopause, tell your health care provider.  There are currently no reliable tests available to screen for ovarian cancer. Lung Cancer  Lung cancer screening is recommended for adults 5-41 years old who are at high risk for lung cancer because of a history of smoking. A yearly low-dose CT scan of the lungs is recommended if you:  Currently smoke.  Have a  history of at least 30 pack-years of smoking and you currently smoke or have quit within the past 15 years. A pack-year is smoking an average of one pack of cigarettes per day for one year. Yearly screening should:  Continue until it has been 15 years since you quit.  Stop if you develop a health problem that would prevent you from having lung cancer treatment. Colorectal Cancer  This type of cancer can be detected and can often be prevented.  Routine colorectal cancer screening usually begins at age 3 and continues through age 26.  If you have risk factors for colon cancer, your health care provider may recommend that you be screened at an earlier age.  If you have a family history of colorectal cancer, talk with your health care provider about genetic screening.  Your health care provider may also recommend using home test kits to check for hidden blood in your stool.  A small camera at the end of a tube can be used to examine your colon directly (sigmoidoscopy or colonoscopy). This is done to check for the earliest forms of colorectal cancer.  Direct examination of the colon should be repeated every 5-10 years until age 55. However, if early forms of precancerous polyps or small growths are found or if you have a family history or genetic risk for colorectal cancer, you may need to be screened more often. Skin Cancer  Check your skin from head to toe regularly.  Monitor any moles. Be sure to tell your health care provider:  About any  new moles or changes in moles, especially if there is a change in a mole's shape or color.  If you have a mole that is larger than the size of a pencil eraser.  If any of your family members has a history of skin cancer, especially at a young age, talk with your health care provider about genetic screening.  Always use sunscreen. Apply sunscreen liberally and repeatedly throughout the day.  Whenever you are outside, protect yourself by wearing long  sleeves, pants, a wide-brimmed hat, and sunglasses. What should I know about osteoporosis? Osteoporosis is a condition in which bone destruction happens more quickly than new bone creation. After menopause, you may be at an increased risk for osteoporosis. To help prevent osteoporosis or the bone fractures that can happen because of osteoporosis, the following is recommended:  If you are 81-38 years old, get at least 1,000 mg of calcium and at least 600 mg of vitamin D per day.  If you are older than age 64 but younger than age 40, get at least 1,200 mg of calcium and at least 600 mg of vitamin D per day.  If you are older than age 44, get at least 1,200 mg of calcium and at least 800 mg of vitamin D per day. Smoking and excessive alcohol intake increase the risk of osteoporosis. Eat foods that are rich in calcium and vitamin D, and do weight-bearing exercises several times each week as directed by your health care provider. What should I know about how menopause affects my mental health? Depression may occur at any age, but it is more common as you become older. Common symptoms of depression include:  Low or sad mood.  Changes in sleep patterns.  Changes in appetite or eating patterns.  Feeling an overall lack of motivation or enjoyment of activities that you previously enjoyed.  Frequent crying spells. Talk with your health care provider if you think that you are experiencing depression. What should I know about immunizations? It is important that you get and maintain your immunizations. These include:  Tetanus, diphtheria, and pertussis (Tdap) booster vaccine.  Influenza every year before the flu season begins.  Pneumonia vaccine.  Shingles vaccine. Your health care provider may also recommend other immunizations. This information is not intended to replace advice given to you by your health care provider. Make sure you discuss any questions you have with your health care  provider. Document Released: 01/26/2006 Document Revised: 06/23/2016 Document Reviewed: 09/07/2015 Elsevier Interactive Patient Education  2017 [employer].

## 2017-03-13 NOTE — Assessment & Plan Note (Signed)
Stable. Continue current regimen. Continue to monitor.  

## 2017-03-13 NOTE — Assessment & Plan Note (Signed)
Stable. Continue medical management. Call with any concerns.

## 2017-03-13 NOTE — Assessment & Plan Note (Signed)
Continue to follow with urology. Call with any concerns. Stable at this time.

## 2017-03-13 NOTE — Assessment & Plan Note (Signed)
Patient had not considered it. Paperwork given today.

## 2017-03-13 NOTE — Progress Notes (Signed)
BP (!) 124/52   Pulse 62   Temp 98.6 F (37 C) (Oral)   Resp 17   Ht 5\' 5"  (1.651 m)   Wt 161 lb (73 kg)   SpO2 99%   BMI 26.79 kg/m    Subjective:    Patient ID: Stacy Mercado, female    DOB: 1946/08/01, 71 y.o.   MRN: [id number]  HPI: ACASIA SKILTON is a 71 y.o. female presenting on 03/13/2017 for comprehensive medical examination. Current medical complaints include:  DIABETES Hypoglycemic episodes:no Polydipsia/polyuria: yes Visual disturbance: no Chest pain: no Paresthesias: no Glucose Monitoring: no Taking Insulin?: no Blood Pressure Monitoring: not checking Retinal Examination: Up to Date Foot Exam: Up to Date Diabetic Education: Completed Pneumovax: Up to Date Influenza: Up to Date Aspirin: yes  HYPERTENSION / HYPERLIPIDEMIA Satisfied with current treatment? yes Duration of hypertension: chronic BP monitoring frequency: not checking BP medication side effects: no Past BP meds: losartan Duration of hyperlipidemia: chronic Cholesterol medication side effects: Not on anything Cholesterol supplements: none Past cholesterol medications: none Medication compliance: excellent compliance Aspirin: yes Recent stressors: yes Recurrent headaches: no Visual changes: no Palpitations: no Dyspnea: no Chest pain: no Lower extremity edema: no Dizzy/lightheaded: no  DEPRESSION Mood status: exacerbated Satisfied with current treatment?: yes Symptom severity: mild  Duration of current treatment : chronic Side effects: no Medication compliance: excellent compliance Psychotherapy/counseling: no  Previous psychiatric medications: lexapro Depressed mood: yes Anxious mood: no Anhedonia: no Significant weight loss or gain: no Insomnia: no  Fatigue: yes Feelings of worthlessness or guilt: no Impaired concentration/indecisiveness: no Suicidal ideations: no Hopelessness: no Crying spells: yes Depression screen [facility] 2/9 03/13/2017 10/18/2016 08/28/2016 08/14/2016    Decreased Interest 0 0 0 0  Down, Depressed, Hopeless 1 0 0 0  PHQ - 2 Score 1 0 0 0   She currently lives with: brother Menopausal Symptoms: yes- hot flashes  Functional Status Survey: Is the patient deaf or have difficulty hearing?: No Does the patient have difficulty seeing, even when wearing glasses/contacts?: No Does the patient have difficulty concentrating, remembering, or making decisions?: No Does the patient have difficulty walking or climbing stairs?: Yes Does the patient have difficulty dressing or bathing?: Yes (having issues with her shoulder- was in an immobilizer) Does the patient have difficulty doing errands alone such as visiting a doctor's office or shopping?: No  Fall Risk  03/13/2017 08/14/2016  Falls in the past year? Yes Yes  Number falls in past yr: 1 2 or more  Injury with Fall? Yes Yes    Depression Screen Depression screen [facility] 2/9 03/13/2017 10/18/2016 08/28/2016 08/14/2016  Decreased Interest 0 0 0 0  Down, Depressed, Hopeless 1 0 0 0  PHQ - 2 Score 1 0 0 0   Advanced Directives Does patient have a HCPOA?    no Does patient have a living will or MOST form?  no  Past Medical History:  Past Medical History:  Diagnosis Date  . Bladder cancer ([place])   . Chronic low back pain   . Depression   . Diabetes mellitus without complication ([place])   . Hyperlipidemia 08/07/2016  . Hypertension   . PVD (peripheral vascular disease) ([place])   . Urge incontinence of urine 08/07/2016    Surgical History:  Past Surgical History:  Procedure Laterality Date  . APPENDECTOMY  1961  . bladder cancer removed  2014  . complete hysterectomy  1999  . DILATION AND CURETTAGE OF UTERUS  1999  . L rotator cuff  Left 2010  . R thumb ruptured ligament and tendon  2012  . SHOULDER ARTHROSCOPY WITH BICEPSTENOTOMY Right 01/30/2017   Procedure: SHOULDER ARTHROSCOPY WITH BICEPSTENOTOMY;  Surgeon: Corky Mull, MD;  Location: [facility] ORS;  Service: Orthopedics;  Laterality: Right;  biceps  tenolysis  . SHOULDER ARTHROSCOPY WITH OPEN ROTATOR CUFF REPAIR Right 01/30/2017   Procedure: SHOULDER ARTHROSCOPY WITH OPEN ROTATOR CUFF REPAIR;  Surgeon: Corky Mull, MD;  Location: [facility] ORS;  Service: Orthopedics;  Laterality: Right;  Massive rotator cuff tear repair    Medications:  Current Outpatient Prescriptions on File Prior to Visit  Medication Sig  . acetaminophen (TYLENOL) 500 MG tablet Take 1,000 mg by mouth every 6 (six) hours as needed for headache.   Marland Kitchen aspirin EC 81 MG tablet Take 81 mg by mouth daily.  Marland Kitchen GARCINIA CAMBOGIA-CHROMIUM PO Take 1 tablet by mouth 2 (two) times daily.   . Lidocaine-Menthol (ICY HOT LIDOCAINE PLUS MENTHOL EX) Apply 1 application topically 2 (two) times daily as needed (muscle pain).  . mirabegron ER ([name]) 50 MG TB24 tablet Take 1 tablet (50 mg total) by mouth daily.  . Multiple Vitamin (MULTIVITAMIN WITH MINERALS) TABS tablet Take 1 tablet by mouth daily.  . traMADol (ULTRAM) 50 MG tablet TAKE 1 TO 2 TABLETS BY MOUTH FOUR TIMES DAILY AS NEEDED FOR SEVERE PAIN   No current facility-administered medications on file prior to visit.     Allergies:  Allergies  Allergen Reactions  . Statins Other (See Comments)    myalgia    Social History:  Social History   Social History  . Marital status: Single    Spouse name: N/A  . Number of children: N/A  . Years of education: N/A   Occupational History  . Not on file.   Social History Main Topics  . Smoking status: Former Smoker    Years: 50.00    Types: Cigarettes    Quit date: 12/18/2006  . Smokeless tobacco: Never Used  . Alcohol use 4.2 oz/week    7 Shots of liquor per week     Comment: When she is not on any medications  . Drug use: No  . Sexual activity: Not on file   Other Topics Concern  . Not on file   Social History Narrative  . No narrative on file   History  Smoking Status  . Former Smoker  . Years: 50.00  . Types: Cigarettes  . Quit date: 12/18/2006  Smokeless  Tobacco  . Never Used   History  Alcohol Use  . 4.2 oz/week  . 7 Shots of liquor per week    Comment: When she is not on any medications    Family History:  Family History  Problem Relation Age of Onset  . Hypertension Brother   . Diabetes Brother   . Healthy Son   . Healthy Son   . Healthy Daughter   . Kidney cancer Neg Hx   . Bladder Cancer Neg Hx     Past medical history, surgical history, medications, allergies, family history and social history reviewed with patient today and changes made to appropriate areas of the chart.   Review of Systems  Constitutional: Positive for diaphoresis. Negative for chills, fever, malaise/fatigue and weight loss.  HENT: Negative.   Eyes: Negative.   Respiratory: Negative.   Cardiovascular: Negative.   Gastrointestinal: Positive for diarrhea. Negative for abdominal pain, blood in stool, constipation, heartburn, melena, nausea and vomiting.  Genitourinary: Positive for frequency. Negative for  dysuria, flank pain, hematuria and urgency.  Musculoskeletal: Positive for back pain, joint pain, myalgias and neck pain. Negative for falls.  Skin: Negative.   Neurological: Negative.  Negative for weakness.  Endo/Heme/Allergies: Negative for environmental allergies and polydipsia. Bruises/bleeds easily.  Psychiatric/Behavioral: Negative.     All other ROS negative except what is listed above and in the HPI.      Objective:    BP (!) 124/52   Pulse 62   Temp 98.6 F (37 C) (Oral)   Resp 17   Ht 5\' 5"  (1.651 m)   Wt 161 lb (73 kg)   SpO2 99%   BMI 26.79 kg/m   Wt Readings from Last 3 Encounters:  03/13/17 161 lb (73 kg)  01/30/17 163 lb (73.9 kg)  01/25/17 163 lb 12.8 oz (74.3 kg)     Hearing Screening   125Hz  250Hz  500Hz  1000Hz  2000Hz  3000Hz  4000Hz  6000Hz  8000Hz   Right ear:   25 40 25  40    Left ear:   40 40 25  25      Visual Acuity Screening   Right eye Left eye Both eyes  Without correction: 20/40 20/30 20/25   With  correction:       Physical Exam  Constitutional: She is oriented to person, place, and time. She appears well-developed and well-nourished. No distress.  HENT:  Head: Normocephalic and atraumatic.  Right Ear: Hearing and external ear normal.  Left Ear: Hearing and external ear normal.  Nose: Nose normal.  Mouth/Throat: Oropharynx is clear and moist. No oropharyngeal exudate.  Eyes: Conjunctivae, EOM and lids are normal. Pupils are equal, round, and reactive to light. Right eye exhibits no discharge. Left eye exhibits no discharge. No scleral icterus.  Neck: Normal range of motion. Neck supple. No JVD present. No tracheal deviation present. No thyromegaly present.  Cardiovascular: Normal rate, regular rhythm, normal heart sounds and intact distal pulses.  Exam reveals no gallop and no friction rub.   No murmur heard. Pulmonary/Chest: Effort normal and breath sounds normal. No stridor. No respiratory distress. She has no wheezes. She has no rales. She exhibits no tenderness.  Abdominal: Soft. Bowel sounds are normal. She exhibits no distension and no mass. There is no tenderness. There is no rebound and no guarding.  Musculoskeletal: She exhibits no edema, tenderness or deformity.  In shoulder immobilizer  Lymphadenopathy:    She has no cervical adenopathy.  Neurological: She is alert and oriented to person, place, and time. She has normal reflexes. She displays normal reflexes. No cranial nerve deficit. She exhibits normal muscle tone. Coordination normal.  Skin: Skin is warm, dry and intact. No rash noted. She is not diaphoretic. No erythema. No pallor.  Psychiatric: She has a normal mood and affect. Her speech is normal and behavior is normal. Judgment and thought content normal. Cognition and memory are normal.  Nursing note and vitals reviewed.  Diabetic Foot Exam - Simple   Simple Foot Form Diabetic Foot exam was performed with the following findings:  Yes 03/13/2017 10:54 AM  Visual  Inspection No deformities, no ulcerations, no other skin breakdown bilaterally:  Yes Sensation Testing See comments:  Yes Pulse Check Posterior Tibialis and Dorsalis pulse intact bilaterally:  Yes Comments Decreased sensation over ball of foot bilaterally     6CIT Screen 03/13/2017  What Year? 0 points  What month? 0 points  What time? 0 points  Count back from 20 0 points  Months in reverse 0 points  Repeat phrase  0 points  Total Score 0    Results for orders placed or performed in visit on 03/13/17  Microscopic Examination  Result Value Ref Range   WBC, UA 0-5 0 - 5 /hpf   RBC, UA None seen 0 - 2 /hpf   Epithelial Cells (non renal) 0-10 0 - 10 /hpf   Bacteria, UA None seen None seen/Few  Bayer DCA Hb A1c Waived  Result Value Ref Range   Bayer DCA Hb A1c Waived 5.6 <7.0 %  Microalbumin, Urine Waived  Result Value Ref Range   Microalb, Ur Waived 10 0 - 19 mg/L   Creatinine, Urine Waived 200 10 - 300 mg/dL   Microalb/Creat Ratio <30 <30 mg/g  UA/M w/rflx Culture, Routine  Result Value Ref Range   Specific Gravity, UA 1.020 1.005 - 1.030   pH, UA 5.0 5.0 - 7.5   Color, UA Yellow Yellow   Appearance Ur Clear Clear   Leukocytes, UA 1+ (A) Negative   Protein, UA Negative Negative/Trace   Glucose, UA Negative Negative   Ketones, UA Negative Negative   RBC, UA Negative Negative   Bilirubin, UA Negative Negative   Urobilinogen, Ur 0.2 0.2 - 1.0 mg/dL   Nitrite, UA Negative Negative   Microscopic Examination See below:    Urinalysis Reflex Comment   Urine Culture, Routine  Result Value Ref Range   Urine Culture, Routine WILL FOLLOW       Assessment & Plan:   Problem List Items Addressed This Visit      Cardiovascular and Mediastinum   PVD (peripheral vascular disease) ([place])    Stable. Continue medical management. Call with any concerns.       Relevant Medications   losartan (COZAAR) 50 MG tablet     Endocrine   Controlled type 2 diabetes mellitus (HCC)     Rechecking levels today. Continue current regimen. Continue to monitor. Call with any concerns.       Relevant Medications   metFORMIN (GLUCOPHAGE) 1000 MG tablet   losartan (COZAAR) 50 MG tablet   Other Relevant Orders   Bayer DCA Hb A1c Waived (Completed)   CBC with Differential/Platelet   Comprehensive metabolic panel   Microalbumin, Urine Waived (Completed)   UA/M w/rflx Culture, Routine (Completed)     Musculoskeletal and Integument   Calcific tendonitis of right shoulder    Continue to follow with orthopedics. S/P surgery. Call with any concerns.         Genitourinary   Bladder cancer [facility])    Continue to follow with urology. Call with any concerns. Stable at this time.       Benign hypertensive renal disease    Under good control. Continue current regimen, Continue to monitor.       Relevant Orders   CBC with Differential/Platelet   Comprehensive metabolic panel   Microalbumin, Urine Waived (Completed)   TSH     Other   Depression    Stable. Continue current regimen. Continue to monitor.       Relevant Medications   escitalopram (LEXAPRO) 10 MG tablet   Other Relevant Orders   CBC with Differential/Platelet   Comprehensive metabolic panel   TSH   Hyperlipidemia    Rechecking levels today. Await results and treat as needed.       Relevant Medications   losartan (COZAAR) 50 MG tablet   Other Relevant Orders   CBC with Differential/Platelet   Comprehensive metabolic panel   Lipid Panel w/o Chol/HDL Ratio  Advance directive discussed with patient    Patient had not considered it. Paperwork given today.       Other Visit Diagnoses    Medicare annual wellness visit, subsequent    -  Primary   Preventative care discussed as below.    Hot flashes due to surgical menopause       Will start her on gabapentin. Recheck 1-2 months to see how she's doing.    Screening for colon cancer       Cologuard ordered today.    Relevant Orders   Cologuard    Screening for breast cancer       Mammogram ordered today   Relevant Orders   MM DIGITAL SCREENING BILATERAL   Postmenopausal estrogen deficiency       DEXA ordered today.   Relevant Orders   DG Bone Density       Preventative Services:  Health Risk Assessment and Personalized Prevention Plan: Done today Bone Mass Measurements: Ordered today Breast Cancer Screening: Ordered today CVD Screening: Done today Cervical Cancer Screening: N/A Colon Cancer Screening: Cologuard ordered today Depression Screening: Done today Diabetes Screening: Done today Glaucoma Screening: See your Eye doctor Hepatitis B vaccine: N/A Hepatitis C screening: up to date HIV Screening:Up to date Flu Vaccine: up to date Lung cancer Screening: Ordered today Obesity Screening: Done today Pneumonia Vaccines (2): Due for 2nd in October STI Screening: N/A  Follow up plan: Return 1-2 months, for Follow up hot flashes.   LABORATORY TESTING:  - Pap smear: not applicable  IMMUNIZATIONS:   - Tdap: Tetanus vaccination status reviewed: last tetanus booster within 10 years. - Influenza: Up to date - Pneumovax: Not applicable - Prevnar: Up to date - Zostavax vaccine: Not applicable  SCREENING: -Mammogram: Ordered today  - Colonoscopy: Ordered today  - Bone Density: Ordered today  -Hearing Test: Ordered today   PATIENT COUNSELING:   Advised to take 1 mg of folate supplement per day if capable of pregnancy.   Sexuality: Discussed sexually transmitted diseases, partner selection, use of condoms, avoidance of unintended pregnancy  and contraceptive alternatives.   Advised to avoid cigarette smoking.  I discussed with the patient that most people either abstain from alcohol or drink within safe limits (<=14/week and <=4 drinks/occasion for males, <=7/weeks and <= 3 drinks/occasion for females) and that the risk for alcohol disorders and other health effects rises proportionally with the number of drinks per  week and how often a drinker exceeds daily limits.  Discussed cessation/primary prevention of drug use and availability of treatment for abuse.   Diet: Encouraged to adjust caloric intake to maintain  or achieve ideal body weight, to reduce intake of dietary saturated fat and total fat, to limit sodium intake by avoiding high sodium foods and not adding table salt, and to maintain adequate dietary potassium and calcium preferably from fresh fruits, vegetables, and low-fat dairy products.    stressed the importance of regular exercise  Injury prevention: Discussed safety belts, safety helmets, smoke detector, smoking near bedding or upholstery.   Dental health: Discussed importance of regular tooth brushing, flossing, and dental visits.    NEXT PREVENTATIVE PHYSICAL DUE IN 1 YEAR. Return 1-2 months, for Follow up hot flashes.

## 2017-03-14 ENCOUNTER — Encounter: Payer: Self-pay | Admitting: Family Medicine

## 2017-03-14 LAB — COMPREHENSIVE METABOLIC PANEL
A/G RATIO: 1.7 (ref 1.2–2.2)
ALT: 10 IU/L (ref 0–32)
AST: 13 IU/L (ref 0–40)
Albumin: 4.4 g/dL (ref 3.5–4.8)
Alkaline Phosphatase: 70 IU/L (ref 39–117)
BILIRUBIN TOTAL: 0.6 mg/dL (ref 0.0–1.2)
BUN/Creatinine Ratio: 16 (ref 12–28)
BUN: 11 mg/dL (ref 8–27)
CHLORIDE: 101 mmol/L (ref 96–106)
CO2: 26 mmol/L (ref 18–29)
Calcium: 11 mg/dL — ABNORMAL HIGH (ref 8.7–10.3)
Creatinine, Ser: 0.69 mg/dL (ref 0.57–1.00)
GFR calc non Af Amer: 87 mL/min/{1.73_m2} (ref >59)
GFR, EST AFRICAN AMERICAN: 101 mL/min/{1.73_m2} (ref >59)
Globulin, Total: 2.6 g/dL (ref 1.5–4.5)
Glucose: 106 mg/dL — ABNORMAL HIGH (ref 65–99)
POTASSIUM: 4.6 mmol/L (ref 3.5–5.2)
Sodium: 143 mmol/L (ref 134–144)
Total Protein: 7 g/dL (ref 6.0–8.5)

## 2017-03-14 LAB — CBC WITH DIFFERENTIAL/PLATELET
BASOS: 1 %
Basophils Absolute: 0 10*3/uL (ref 0.0–0.2)
EOS (ABSOLUTE): 0.2 10*3/uL (ref 0.0–0.4)
Eos: 3 %
Hematocrit: 36.6 % (ref 34.0–46.6)
Hemoglobin: 11.9 g/dL (ref 11.1–15.9)
Immature Grans (Abs): 0 10*3/uL (ref 0.0–0.1)
Immature Granulocytes: 0 %
Lymphocytes Absolute: 1.9 10*3/uL (ref 0.7–3.1)
Lymphs: 34 %
MCH: 29.4 pg (ref 26.6–33.0)
MCHC: 32.5 g/dL (ref 31.5–35.7)
MCV: 90 fL (ref 79–97)
Monocytes Absolute: 0.7 10*3/uL (ref 0.1–0.9)
Monocytes: 13 %
NEUTROS PCT: 49 %
Neutrophils Absolute: 2.8 10*3/uL (ref 1.4–7.0)
PLATELETS: 213 10*3/uL (ref 150–379)
RBC: 4.05 x10E6/uL (ref 3.77–5.28)
RDW: 14.3 % (ref 12.3–15.4)
WBC: 5.6 10*3/uL (ref 3.4–10.8)

## 2017-03-14 LAB — LIPID PANEL W/O CHOL/HDL RATIO
Cholesterol, Total: 211 mg/dL — ABNORMAL HIGH (ref 100–199)
HDL: 58 mg/dL (ref >39)
LDL Calculated: 120 mg/dL — ABNORMAL HIGH (ref 0–99)
TRIGLYCERIDES: 167 mg/dL — AB (ref 0–149)
VLDL Cholesterol Cal: 33 mg/dL (ref 5–40)

## 2017-03-14 LAB — TSH: TSH: 3.01 u[IU]/mL (ref 0.450–4.500)

## 2017-03-15 LAB — UA/M W/RFLX CULTURE, ROUTINE
Bilirubin, UA: NEGATIVE
Glucose, UA: NEGATIVE
Ketones, UA: NEGATIVE
Nitrite, UA: NEGATIVE
Protein, UA: NEGATIVE
RBC, UA: NEGATIVE
Specific Gravity, UA: 1.02 (ref 1.005–1.030)
Urobilinogen, Ur: 0.2 mg/dL (ref 0.2–1.0)
pH, UA: 5 (ref 5.0–7.5)

## 2017-03-15 LAB — URINE CULTURE, REFLEX

## 2017-03-15 LAB — MICROSCOPIC EXAMINATION
Bacteria, UA: NONE SEEN
RBC, UA: NONE SEEN /hpf (ref 0–?)

## 2017-03-15 LAB — BAYER DCA HB A1C WAIVED: HB A1C: 5.6 % (ref <7.0)

## 2017-03-15 LAB — MICROALBUMIN, URINE WAIVED
CREATININE, URINE WAIVED: 200 mg/dL (ref 10–300)
MICROALB, UR WAIVED: 10 mg/L (ref 0–19)
Microalb/Creat Ratio: <30 mg/g (ref <30)

## 2017-03-19 DIAGNOSIS — M25611 Stiffness of right shoulder, not elsewhere classified: Secondary | ICD-10-CM | POA: Diagnosis not present

## 2017-03-19 DIAGNOSIS — M6281 Muscle weakness (generalized): Secondary | ICD-10-CM | POA: Diagnosis not present

## 2017-03-19 DIAGNOSIS — M25511 Pain in right shoulder: Secondary | ICD-10-CM | POA: Diagnosis not present

## 2017-03-19 DIAGNOSIS — Z9889 Other specified postprocedural states: Secondary | ICD-10-CM | POA: Diagnosis not present

## 2017-03-21 DIAGNOSIS — M25611 Stiffness of right shoulder, not elsewhere classified: Secondary | ICD-10-CM | POA: Diagnosis not present

## 2017-03-21 DIAGNOSIS — Z9889 Other specified postprocedural states: Secondary | ICD-10-CM | POA: Diagnosis not present

## 2017-04-02 ENCOUNTER — Telehealth: Payer: Self-pay | Admitting: Radiology

## 2017-04-02 DIAGNOSIS — Z9889 Other specified postprocedural states: Secondary | ICD-10-CM | POA: Diagnosis not present

## 2017-04-02 DIAGNOSIS — M25611 Stiffness of right shoulder, not elsewhere classified: Secondary | ICD-10-CM | POA: Diagnosis not present

## 2017-04-02 DIAGNOSIS — M25511 Pain in right shoulder: Secondary | ICD-10-CM | POA: Diagnosis not present

## 2017-04-02 DIAGNOSIS — M6281 Muscle weakness (generalized): Secondary | ICD-10-CM | POA: Diagnosis not present

## 2017-04-02 NOTE — Telephone Encounter (Signed)
Pt would like more [name] samples. She is running low.

## 2017-04-02 NOTE — Telephone Encounter (Signed)
Pt walked into office and samples were given.

## 2017-04-04 DIAGNOSIS — M25511 Pain in right shoulder: Secondary | ICD-10-CM | POA: Diagnosis not present

## 2017-04-04 DIAGNOSIS — M6281 Muscle weakness (generalized): Secondary | ICD-10-CM | POA: Diagnosis not present

## 2017-04-04 DIAGNOSIS — M25611 Stiffness of right shoulder, not elsewhere classified: Secondary | ICD-10-CM | POA: Diagnosis not present

## 2017-04-04 DIAGNOSIS — Z9889 Other specified postprocedural states: Secondary | ICD-10-CM | POA: Diagnosis not present

## 2017-04-06 ENCOUNTER — Telehealth: Payer: Self-pay

## 2017-04-06 ENCOUNTER — Ambulatory Visit: Payer: Self-pay

## 2017-04-06 NOTE — Telephone Encounter (Signed)
Pt called stating she feels like she has a UTI. Pt requested an abx be sent to her pharmacy. Made pt aware would need a nurse visit for cath specimen prior to any abx given. Pt was added to nurse schedule for today at 10:00.

## 2017-04-09 ENCOUNTER — Ambulatory Visit (INDEPENDENT_AMBULATORY_CARE_PROVIDER_SITE_OTHER): Payer: Medicare Other

## 2017-04-09 VITALS — BP 125/76 | HR 68 | Ht 65.0 in | Wt 163.9 lb

## 2017-04-09 DIAGNOSIS — Z9889 Other specified postprocedural states: Secondary | ICD-10-CM | POA: Diagnosis not present

## 2017-04-09 DIAGNOSIS — M25511 Pain in right shoulder: Secondary | ICD-10-CM | POA: Diagnosis not present

## 2017-04-09 DIAGNOSIS — M25611 Stiffness of right shoulder, not elsewhere classified: Secondary | ICD-10-CM | POA: Diagnosis not present

## 2017-04-09 DIAGNOSIS — R300 Dysuria: Secondary | ICD-10-CM

## 2017-04-09 DIAGNOSIS — M6281 Muscle weakness (generalized): Secondary | ICD-10-CM | POA: Diagnosis not present

## 2017-04-09 LAB — URINALYSIS, COMPLETE
Bilirubin, UA: NEGATIVE
Glucose, UA: NEGATIVE
Ketones, UA: NEGATIVE
NITRITE UA: NEGATIVE
PH UA: 5.5 (ref 5.0–7.5)
Protein, UA: NEGATIVE
RBC, UA: NEGATIVE
Specific Gravity, UA: 1.02 (ref 1.005–1.030)
UUROB: 0.2 mg/dL (ref 0.2–1.0)

## 2017-04-09 LAB — MICROSCOPIC EXAMINATION: RBC MICROSCOPIC, UA: NONE SEEN /HPF (ref 0–?)

## 2017-04-09 NOTE — Progress Notes (Signed)
Pt presents today with c/o urinary frequency and urgency, hard to postpone urination, leakage of urine, and back/flank pain. Pt gave a clean catch urine for u/a and cx.  Blood pressure 125/76, pulse 68, height 5\' 5"  (1.651 m), weight 163 lb 14.4 oz (74.3 kg).

## 2017-04-10 ENCOUNTER — Telehealth: Payer: Self-pay

## 2017-04-10 NOTE — Telephone Encounter (Signed)
Spoke with pt in reference to -u/a and holding off on abx until ucx results. Pt voiced understanding.

## 2017-04-10 NOTE — Telephone Encounter (Signed)
-----   Message from Hollice Espy, MD sent at 04/09/2017  7:28 PM EDT ----- UA negative.  Will hold off on abx unless UCx grows bacteria which is not anticipated.    Hollice Espy, MD

## 2017-04-10 NOTE — Telephone Encounter (Signed)
No answer

## 2017-04-12 LAB — CULTURE, URINE COMPREHENSIVE

## 2017-04-13 ENCOUNTER — Telehealth: Payer: Self-pay

## 2017-04-13 ENCOUNTER — Ambulatory Visit (INDEPENDENT_AMBULATORY_CARE_PROVIDER_SITE_OTHER): Payer: Medicare Other | Admitting: Family Medicine

## 2017-04-13 ENCOUNTER — Encounter: Payer: Self-pay | Admitting: Family Medicine

## 2017-04-13 VITALS — BP 136/78 | HR 62 | Temp 97.9°F | Wt 163.9 lb

## 2017-04-13 DIAGNOSIS — E8941 Symptomatic postprocedural ovarian failure: Secondary | ICD-10-CM | POA: Diagnosis not present

## 2017-04-13 DIAGNOSIS — R252 Cramp and spasm: Secondary | ICD-10-CM

## 2017-04-13 MED ORDER — GABAPENTIN 300 MG PO CAPS: 300.0000 mg | ORAL_CAPSULE | Freq: Every day | ORAL | 1 refills | Status: DC

## 2017-04-13 NOTE — Telephone Encounter (Signed)
Spoke with pt in reference to ucx and u/a results being negative. Pt voiced understanding.

## 2017-04-13 NOTE — Progress Notes (Signed)
BP 136/78 (BP Location: Left Arm, Patient Position: Sitting, Cuff Size: Normal)   Pulse 62   Temp 97.9 F (36.6 C)   Wt 163 lb 14.4 oz (74.3 kg)   SpO2 98%   BMI 27.27 kg/m    Subjective:    Patient ID: Stacy Mercado, female    DOB: Aug 01, 1946, 71 y.o.   MRN: [id number]  HPI: JENNIFERANN STUCKERT is a 71 y.o. female  Chief Complaint  Patient presents with  . Hot Flashes   Hot Flashes- a lot better on the gabapentin. Feeling a lot better. No side effects.   LEG CRAMPS- has been having bad cramps in her thighs Duration: About a month Pain: yes Severity: moderate  Quality:  aching and cramping Location: thighs  Bilateral:  yes Onset: gradual Frequency: intermittent-  Time of  day:   at random Sudden unintentional leg jerking:   no Paresthesias:   no Decreased sensation:  no Weakness:   no Insomnia:   yes Fatigue:   yes Alleviating factors: better with walking,  Aggravating factors: worse with sitting or laying down Status: stable Treatments attempted: icy hot, hot shower, baclofen- don't help   Relevant past medical, surgical, family and social history reviewed and updated as indicated. Interim medical history since our last visit reviewed. Allergies and medications reviewed and updated.  Review of Systems  Constitutional: Negative.   Respiratory: Negative.   Cardiovascular: Negative.   Musculoskeletal: Positive for myalgias. Negative for arthralgias, back pain, gait problem, joint swelling, neck pain and neck stiffness.  Psychiatric/Behavioral: Negative.     Per HPI unless specifically indicated above     Objective:    BP 136/78 (BP Location: Left Arm, Patient Position: Sitting, Cuff Size: Normal)   Pulse 62   Temp 97.9 F (36.6 C)   Wt 163 lb 14.4 oz (74.3 kg)   SpO2 98%   BMI 27.27 kg/m   Wt Readings from Last 3 Encounters:  04/13/17 163 lb 14.4 oz (74.3 kg)  04/09/17 163 lb 14.4 oz (74.3 kg)  03/13/17 161 lb (73 kg)    Physical Exam    Constitutional: She is oriented to person, place, and time. She appears well-developed and well-nourished. No distress.  HENT:  Head: Normocephalic and atraumatic.  Right Ear: Hearing normal.  Left Ear: Hearing normal.  Nose: Nose normal.  Eyes: Conjunctivae and lids are normal. Right eye exhibits no discharge. Left eye exhibits no discharge. No scleral icterus.  Cardiovascular: Normal rate, regular rhythm, normal heart sounds and intact distal pulses.  Exam reveals no gallop and no friction rub.   No murmur heard. Pulmonary/Chest: Effort normal and breath sounds normal. No respiratory distress. She has no wheezes. She has no rales. She exhibits no tenderness.  Musculoskeletal: Normal range of motion.  Neurological: She is alert and oriented to person, place, and time.  Skin: Skin is warm, dry and intact. No rash noted. No erythema. No pallor.  Psychiatric: She has a normal mood and affect. Her speech is normal and behavior is normal. Judgment and thought content normal. Cognition and memory are normal.  Nursing note and vitals reviewed.   Results for orders placed or performed in visit on 04/09/17  CULTURE, URINE COMPREHENSIVE  Result Value Ref Range   Urine Culture, Comprehensive Final report    Result 1 Comment   Microscopic Examination  Result Value Ref Range   WBC, UA 0-5 0 - 5 /hpf   RBC, UA None seen 0 - 2 /hpf  Epithelial Cells (non renal) 0-10 0 - 10 /hpf   Bacteria, UA Few (A) None seen/Few  Urinalysis, Complete  Result Value Ref Range   Specific Gravity, UA 1.020 1.005 - 1.030   pH, UA 5.5 5.0 - 7.5   Color, UA Yellow Yellow   Appearance Ur Clear Clear   Leukocytes, UA Trace (A) Negative   Protein, UA Negative Negative/Trace   Glucose, UA Negative Negative   Ketones, UA Negative Negative   RBC, UA Negative Negative   Bilirubin, UA Negative Negative   Urobilinogen, Ur 0.2 0.2 - 1.0 mg/dL   Nitrite, UA Negative Negative   Microscopic Examination See below:        Assessment & Plan:   Problem List Items Addressed This Visit    None    Visit Diagnoses    Leg cramps    -  Primary   Will check labs. Await results. Start quinine PRN. Heat as needed. Call with any concerns.    Relevant Orders   Comprehensive metabolic panel   Phosphorus   Magnesium   Hot flashes due to surgical menopause       Resolved on gabapentin. Continue gabapentin. call with any concerns.        Follow up plan: Return End of June, for Diabetes visit.

## 2017-04-13 NOTE — Telephone Encounter (Signed)
-----   Message from Hollice Espy, MD sent at 04/13/2017  7:54 AM EDT ----- Urine culture essentially negative and UA completely negative.  No sign of infection.  Hollice Espy, MD

## 2017-04-16 DIAGNOSIS — M6281 Muscle weakness (generalized): Secondary | ICD-10-CM | POA: Diagnosis not present

## 2017-04-16 DIAGNOSIS — M25611 Stiffness of right shoulder, not elsewhere classified: Secondary | ICD-10-CM | POA: Diagnosis not present

## 2017-04-16 DIAGNOSIS — M25511 Pain in right shoulder: Secondary | ICD-10-CM | POA: Diagnosis not present

## 2017-04-16 DIAGNOSIS — Z9889 Other specified postprocedural states: Secondary | ICD-10-CM | POA: Diagnosis not present

## 2017-04-18 DIAGNOSIS — Z9889 Other specified postprocedural states: Secondary | ICD-10-CM | POA: Diagnosis not present

## 2017-04-18 DIAGNOSIS — M6281 Muscle weakness (generalized): Secondary | ICD-10-CM | POA: Diagnosis not present

## 2017-04-18 DIAGNOSIS — M25511 Pain in right shoulder: Secondary | ICD-10-CM | POA: Diagnosis not present

## 2017-04-18 DIAGNOSIS — M25611 Stiffness of right shoulder, not elsewhere classified: Secondary | ICD-10-CM | POA: Diagnosis not present

## 2017-04-23 DIAGNOSIS — M6281 Muscle weakness (generalized): Secondary | ICD-10-CM | POA: Diagnosis not present

## 2017-04-23 DIAGNOSIS — Z9889 Other specified postprocedural states: Secondary | ICD-10-CM | POA: Diagnosis not present

## 2017-04-23 DIAGNOSIS — M25611 Stiffness of right shoulder, not elsewhere classified: Secondary | ICD-10-CM | POA: Diagnosis not present

## 2017-04-25 DIAGNOSIS — M25611 Stiffness of right shoulder, not elsewhere classified: Secondary | ICD-10-CM | POA: Diagnosis not present

## 2017-04-25 DIAGNOSIS — Z9889 Other specified postprocedural states: Secondary | ICD-10-CM | POA: Diagnosis not present

## 2017-04-25 DIAGNOSIS — M6281 Muscle weakness (generalized): Secondary | ICD-10-CM | POA: Diagnosis not present

## 2017-04-26 ENCOUNTER — Telehealth: Payer: Self-pay | Admitting: Family Medicine

## 2017-04-26 NOTE — Telephone Encounter (Signed)
Patient notified that she is up to date on her vaccine.

## 2017-04-26 NOTE — Telephone Encounter (Signed)
Patient called in regards to her shingles shot. Patient would like to be more informed about the shingles shot such as when her last shingles shot was and if she would need to receive another shingles shot. Please Advise.  Patient contact number: [phone]  Thank you.

## 2017-05-02 DIAGNOSIS — M25511 Pain in right shoulder: Secondary | ICD-10-CM | POA: Diagnosis not present

## 2017-05-02 DIAGNOSIS — M25611 Stiffness of right shoulder, not elsewhere classified: Secondary | ICD-10-CM | POA: Diagnosis not present

## 2017-05-02 DIAGNOSIS — M6281 Muscle weakness (generalized): Secondary | ICD-10-CM | POA: Diagnosis not present

## 2017-05-02 DIAGNOSIS — Z9889 Other specified postprocedural states: Secondary | ICD-10-CM | POA: Diagnosis not present

## 2017-05-07 DIAGNOSIS — M6281 Muscle weakness (generalized): Secondary | ICD-10-CM | POA: Diagnosis not present

## 2017-05-07 DIAGNOSIS — M25511 Pain in right shoulder: Secondary | ICD-10-CM | POA: Diagnosis not present

## 2017-05-07 DIAGNOSIS — M25611 Stiffness of right shoulder, not elsewhere classified: Secondary | ICD-10-CM | POA: Diagnosis not present

## 2017-05-07 DIAGNOSIS — Z9889 Other specified postprocedural states: Secondary | ICD-10-CM | POA: Diagnosis not present

## 2017-05-09 DIAGNOSIS — Z9889 Other specified postprocedural states: Secondary | ICD-10-CM | POA: Diagnosis not present

## 2017-05-09 DIAGNOSIS — M25511 Pain in right shoulder: Secondary | ICD-10-CM | POA: Diagnosis not present

## 2017-05-09 DIAGNOSIS — M6281 Muscle weakness (generalized): Secondary | ICD-10-CM | POA: Diagnosis not present

## 2017-05-09 DIAGNOSIS — M25611 Stiffness of right shoulder, not elsewhere classified: Secondary | ICD-10-CM | POA: Diagnosis not present

## 2017-05-16 DIAGNOSIS — M25611 Stiffness of right shoulder, not elsewhere classified: Secondary | ICD-10-CM | POA: Diagnosis not present

## 2017-05-16 DIAGNOSIS — M25511 Pain in right shoulder: Secondary | ICD-10-CM | POA: Diagnosis not present

## 2017-05-16 DIAGNOSIS — Z9889 Other specified postprocedural states: Secondary | ICD-10-CM | POA: Diagnosis not present

## 2017-05-16 DIAGNOSIS — M6281 Muscle weakness (generalized): Secondary | ICD-10-CM | POA: Diagnosis not present

## 2017-05-24 DIAGNOSIS — Z1211 Encounter for screening for malignant neoplasm of colon: Secondary | ICD-10-CM | POA: Diagnosis not present

## 2017-05-24 DIAGNOSIS — Z1212 Encounter for screening for malignant neoplasm of rectum: Secondary | ICD-10-CM | POA: Diagnosis not present

## 2017-05-24 LAB — COLOGUARD: Cologuard: NEGATIVE

## 2017-05-31 ENCOUNTER — Telehealth: Payer: Self-pay

## 2017-05-31 ENCOUNTER — Other Ambulatory Visit: Payer: Self-pay

## 2017-05-31 NOTE — Telephone Encounter (Signed)
Negative Cologuard.

## 2017-06-01 ENCOUNTER — Encounter: Payer: Self-pay | Admitting: Family Medicine

## 2017-06-01 DIAGNOSIS — M19011 Primary osteoarthritis, right shoulder: Secondary | ICD-10-CM | POA: Diagnosis not present

## 2017-06-01 DIAGNOSIS — M75121 Complete rotator cuff tear or rupture of right shoulder, not specified as traumatic: Secondary | ICD-10-CM | POA: Diagnosis not present

## 2017-06-01 DIAGNOSIS — M7521 Bicipital tendinitis, right shoulder: Secondary | ICD-10-CM | POA: Diagnosis not present

## 2017-06-01 DIAGNOSIS — M7581 Other shoulder lesions, right shoulder: Secondary | ICD-10-CM | POA: Diagnosis not present

## 2017-06-21 ENCOUNTER — Telehealth: Payer: Self-pay | Admitting: Urology

## 2017-06-21 MED ORDER — MIRABEGRON ER 50 MG PO TB24: 50.0000 mg | ORAL_TABLET | Freq: Every day | ORAL | 4 refills | Status: DC

## 2017-06-21 NOTE — Telephone Encounter (Signed)
I sent RX to her pharmacy, left her a message

## 2017-06-21 NOTE — Telephone Encounter (Signed)
50 mg

## 2017-06-21 NOTE — Telephone Encounter (Signed)
Pt left message and would like more [name] samples.  Please give her a call [phone].

## 2017-06-22 NOTE — Telephone Encounter (Signed)
Patient came to the office today and requested samples of [name] 50mg .  I advised her that a prescription has been called into her pharmacy.    Patient stated that she cannot afford her medication, as it is over $100 per month.  I gave her 2 boxes of [name] 50mg  today.

## 2017-06-28 ENCOUNTER — Telehealth: Payer: Self-pay

## 2017-06-28 NOTE — Telephone Encounter (Signed)
Submitted [name] PA through CoverMyMeds: Key# [id number]

## 2017-07-03 ENCOUNTER — Other Ambulatory Visit: Payer: Self-pay | Admitting: Family Medicine

## 2017-07-03 NOTE — Telephone Encounter (Signed)
Spoke with the pharmacy, medication were on file, they will get them ready for the patient.

## 2017-07-03 NOTE — Telephone Encounter (Signed)
Got a six month supply in March- shouldn't be due until September.

## 2017-07-04 NOTE — Telephone Encounter (Signed)
[name] 50mg  PA approved through 12/17/2017 [id number] Notified patient.

## 2017-07-06 ENCOUNTER — Ambulatory Visit: Payer: Medicare Other

## 2017-07-11 ENCOUNTER — Encounter: Payer: Self-pay | Admitting: Urology

## 2017-07-11 ENCOUNTER — Telehealth: Payer: Self-pay | Admitting: Urology

## 2017-07-11 ENCOUNTER — Other Ambulatory Visit: Payer: Medicare Other | Admitting: Urology

## 2017-07-11 NOTE — Telephone Encounter (Signed)
No show today.  Please call this patient personally and arrange for rescheduled cystoscopy. She has a personal history of bladder cancer and renal mass which needs follow-up. It is imperative that she follow-up.  Hollice Espy, MD

## 2017-07-12 NOTE — Telephone Encounter (Signed)
Called and left a message for her to call me back. She did not have her RUS that you ordered. It was scheduled for 07-06-17 but she cancelled it. It doesn't say why.  Thanks, Sharyn Lull

## 2017-07-13 ENCOUNTER — Telehealth: Payer: Self-pay | Admitting: Urology

## 2017-07-13 NOTE — Telephone Encounter (Signed)
Patient said she forgot about this app and I have rescheduled it for next month, I have also transferred her over to scheduling to get her RUS rescheduled. She also wanted you to know that she got approved for the [name] but it will cost her $350.00 and she said she can't afford that so unless she can get samples she will not be able to take it anymore.   Thanks,  Sharyn Lull

## 2017-08-01 ENCOUNTER — Ambulatory Visit
Admission: RE | Admit: 2017-08-01 | Discharge: 2017-08-01 | Disposition: A | Discharge status: Home / Self Care | Payer: Medicare Other | Source: Ambulatory Visit | Attending: Urology | Admitting: Urology

## 2017-08-01 DIAGNOSIS — N2889 Other specified disorders of kidney and ureter: Secondary | ICD-10-CM

## 2017-08-03 DIAGNOSIS — M75121 Complete rotator cuff tear or rupture of right shoulder, not specified as traumatic: Secondary | ICD-10-CM | POA: Diagnosis not present

## 2017-08-03 DIAGNOSIS — M1711 Unilateral primary osteoarthritis, right knee: Secondary | ICD-10-CM | POA: Diagnosis not present

## 2017-08-03 DIAGNOSIS — M19011 Primary osteoarthritis, right shoulder: Secondary | ICD-10-CM | POA: Diagnosis not present

## 2017-08-03 DIAGNOSIS — M1120 Other chondrocalcinosis, unspecified site: Secondary | ICD-10-CM | POA: Insufficient documentation

## 2017-08-03 DIAGNOSIS — S83271A Complex tear of lateral meniscus, current injury, right knee, initial encounter: Secondary | ICD-10-CM | POA: Diagnosis not present

## 2017-08-03 DIAGNOSIS — M7581 Other shoulder lesions, right shoulder: Secondary | ICD-10-CM | POA: Diagnosis not present

## 2017-08-03 DIAGNOSIS — M25561 Pain in right knee: Secondary | ICD-10-CM | POA: Diagnosis not present

## 2017-08-03 DIAGNOSIS — M7521 Bicipital tendinitis, right shoulder: Secondary | ICD-10-CM | POA: Diagnosis not present

## 2017-08-07 ENCOUNTER — Encounter: Payer: Self-pay | Admitting: Urology

## 2017-08-07 ENCOUNTER — Ambulatory Visit (INDEPENDENT_AMBULATORY_CARE_PROVIDER_SITE_OTHER): Payer: Medicare Other | Admitting: Urology

## 2017-08-07 VITALS — BP 115/62 | HR 76 | Ht 65.0 in | Wt 163.0 lb

## 2017-08-07 DIAGNOSIS — N3281 Overactive bladder: Secondary | ICD-10-CM

## 2017-08-07 DIAGNOSIS — N2889 Other specified disorders of kidney and ureter: Secondary | ICD-10-CM

## 2017-08-07 DIAGNOSIS — Z8551 Personal history of malignant neoplasm of bladder: Secondary | ICD-10-CM | POA: Diagnosis not present

## 2017-08-07 DIAGNOSIS — C679 Malignant neoplasm of bladder, unspecified: Secondary | ICD-10-CM | POA: Diagnosis not present

## 2017-08-07 LAB — URINALYSIS, COMPLETE
Bilirubin, UA: NEGATIVE
Glucose, UA: NEGATIVE
Ketones, UA: NEGATIVE
NITRITE UA: NEGATIVE
PROTEIN UA: NEGATIVE
RBC UA: NEGATIVE
Specific Gravity, UA: 1.025 (ref 1.005–1.030)
UUROB: 0.2 mg/dL (ref 0.2–1.0)
pH, UA: 6 (ref 5.0–7.5)

## 2017-08-07 LAB — MICROSCOPIC EXAMINATION: RBC, UA: NONE SEEN /hpf (ref 0–?)

## 2017-08-07 MED ORDER — LIDOCAINE HCL 2 % EX GEL
1.0000 "application " | Freq: Once | CUTANEOUS | Status: AC
  Administered 2017-08-07: 1 via URETHRAL

## 2017-08-07 MED ORDER — CIPROFLOXACIN HCL 500 MG PO TABS
500.0000 mg | ORAL_TABLET | Freq: Once | ORAL | Status: AC
  Administered 2017-08-07: 500 mg via ORAL
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
## 2017-09-14 DIAGNOSIS — M1711 Unilateral primary osteoarthritis, right knee: Secondary | ICD-10-CM | POA: Diagnosis not present

## 2017-09-14 DIAGNOSIS — M1120 Other chondrocalcinosis, unspecified site: Secondary | ICD-10-CM | POA: Diagnosis not present

## 2017-09-14 DIAGNOSIS — M7521 Bicipital tendinitis, right shoulder: Secondary | ICD-10-CM | POA: Diagnosis not present

## 2017-09-14 DIAGNOSIS — S83271D Complex tear of lateral meniscus, current injury, right knee, subsequent encounter: Secondary | ICD-10-CM | POA: Diagnosis not present

## 2017-09-14 DIAGNOSIS — M7581 Other shoulder lesions, right shoulder: Secondary | ICD-10-CM | POA: Diagnosis not present

## 2017-09-14 DIAGNOSIS — M19011 Primary osteoarthritis, right shoulder: Secondary | ICD-10-CM | POA: Diagnosis not present

## 2017-09-14 DIAGNOSIS — M75121 Complete rotator cuff tear or rupture of right shoulder, not specified as traumatic: Secondary | ICD-10-CM | POA: Diagnosis not present

## 2017-09-17 ENCOUNTER — Other Ambulatory Visit: Payer: Self-pay | Admitting: Surgery

## 2017-09-17 DIAGNOSIS — M75121 Complete rotator cuff tear or rupture of right shoulder, not specified as traumatic: Secondary | ICD-10-CM

## 2017-09-17 DIAGNOSIS — M19011 Primary osteoarthritis, right shoulder: Secondary | ICD-10-CM

## 2017-09-17 DIAGNOSIS — M7521 Bicipital tendinitis, right shoulder: Secondary | ICD-10-CM

## 2017-09-17 DIAGNOSIS — M7581 Other shoulder lesions, right shoulder: Secondary | ICD-10-CM

## 2017-09-19 ENCOUNTER — Other Ambulatory Visit: Payer: Self-pay | Admitting: Family Medicine

## 2017-09-20 ENCOUNTER — Ambulatory Visit: Payer: Medicare Other

## 2017-09-20 ENCOUNTER — Ambulatory Visit (INDEPENDENT_AMBULATORY_CARE_PROVIDER_SITE_OTHER): Payer: Medicare Other

## 2017-09-20 DIAGNOSIS — Z23 Encounter for immunization: Secondary | ICD-10-CM

## 2017-10-04 ENCOUNTER — Ambulatory Visit
Admission: RE | Admit: 2017-10-04 | Discharge: 2017-10-04 | Disposition: A | Discharge status: Home / Self Care | Payer: Medicare Other | Source: Ambulatory Visit | Attending: Surgery | Admitting: Surgery

## 2017-10-04 DIAGNOSIS — M62511 Muscle wasting and atrophy, not elsewhere classified, right shoulder: Secondary | ICD-10-CM | POA: Diagnosis not present

## 2017-10-04 DIAGNOSIS — M19011 Primary osteoarthritis, right shoulder: Secondary | ICD-10-CM | POA: Diagnosis not present

## 2017-10-04 DIAGNOSIS — Z9889 Other specified postprocedural states: Secondary | ICD-10-CM | POA: Insufficient documentation

## 2017-10-04 DIAGNOSIS — M7521 Bicipital tendinitis, right shoulder: Secondary | ICD-10-CM | POA: Diagnosis not present

## 2017-10-04 DIAGNOSIS — M75121 Complete rotator cuff tear or rupture of right shoulder, not specified as traumatic: Secondary | ICD-10-CM | POA: Insufficient documentation

## 2017-10-04 DIAGNOSIS — M25511 Pain in right shoulder: Secondary | ICD-10-CM | POA: Diagnosis not present

## 2017-10-04 DIAGNOSIS — M7581 Other shoulder lesions, right shoulder: Secondary | ICD-10-CM | POA: Insufficient documentation

## 2017-10-08 DIAGNOSIS — M75121 Complete rotator cuff tear or rupture of right shoulder, not specified as traumatic: Secondary | ICD-10-CM | POA: Diagnosis not present

## 2017-10-08 DIAGNOSIS — M7581 Other shoulder lesions, right shoulder: Secondary | ICD-10-CM | POA: Diagnosis not present

## 2017-10-26 ENCOUNTER — Other Ambulatory Visit: Payer: Self-pay | Admitting: Family Medicine

## 2017-11-05 ENCOUNTER — Encounter
Admission: RE | Admit: 2017-11-05 | Discharge: 2017-11-05 | Disposition: A | Discharge status: Home / Self Care | Payer: Medicare Other | Source: Ambulatory Visit | Attending: Surgery | Admitting: Surgery

## 2017-11-05 ENCOUNTER — Other Ambulatory Visit: Payer: Self-pay

## 2017-11-05 DIAGNOSIS — E1151 Type 2 diabetes mellitus with diabetic peripheral angiopathy without gangrene: Secondary | ICD-10-CM | POA: Insufficient documentation

## 2017-11-05 DIAGNOSIS — M75121 Complete rotator cuff tear or rupture of right shoulder, not specified as traumatic: Secondary | ICD-10-CM | POA: Insufficient documentation

## 2017-11-05 DIAGNOSIS — N289 Disorder of kidney and ureter, unspecified: Secondary | ICD-10-CM | POA: Insufficient documentation

## 2017-11-05 DIAGNOSIS — Z7984 Long term (current) use of oral hypoglycemic drugs: Secondary | ICD-10-CM | POA: Insufficient documentation

## 2017-11-05 DIAGNOSIS — F329 Major depressive disorder, single episode, unspecified: Secondary | ICD-10-CM | POA: Insufficient documentation

## 2017-11-05 DIAGNOSIS — E785 Hyperlipidemia, unspecified: Secondary | ICD-10-CM | POA: Diagnosis not present

## 2017-11-05 DIAGNOSIS — Z01818 Encounter for other preprocedural examination: Secondary | ICD-10-CM | POA: Diagnosis not present

## 2017-11-05 DIAGNOSIS — Z7982 Long term (current) use of aspirin: Secondary | ICD-10-CM | POA: Diagnosis not present

## 2017-11-05 DIAGNOSIS — Z79899 Other long term (current) drug therapy: Secondary | ICD-10-CM | POA: Insufficient documentation

## 2017-11-05 DIAGNOSIS — I10 Essential (primary) hypertension: Secondary | ICD-10-CM | POA: Insufficient documentation

## 2017-11-05 HISTORY — DX: Unspecified osteoarthritis, unspecified site: M19.90

## 2017-11-05 LAB — BASIC METABOLIC PANEL
Anion gap: 7 (ref 5–15)
BUN: 16 mg/dL (ref 6–20)
CHLORIDE: 104 mmol/L (ref 101–111)
CO2: 29 mmol/L (ref 22–32)
CREATININE: 0.67 mg/dL (ref 0.44–1.00)
Calcium: 10.4 mg/dL — ABNORMAL HIGH (ref 8.9–10.3)
Glucose, Bld: 106 mg/dL — ABNORMAL HIGH (ref 65–99)
POTASSIUM: 4.1 mmol/L (ref 3.5–5.1)
SODIUM: 140 mmol/L (ref 135–145)

## 2017-11-05 LAB — PROTIME-INR
INR: 0.94
PROTHROMBIN TIME: 12.5 s (ref 11.4–15.2)

## 2017-11-05 LAB — TYPE AND SCREEN
ABO/RH(D): A POS
Antibody Screen: NEGATIVE

## 2017-11-05 LAB — URINALYSIS, ROUTINE W REFLEX MICROSCOPIC
BILIRUBIN URINE: NEGATIVE
GLUCOSE, UA: NEGATIVE mg/dL
Hgb urine dipstick: NEGATIVE
KETONES UR: NEGATIVE mg/dL
LEUKOCYTES UA: NEGATIVE
NITRITE: NEGATIVE
PROTEIN: NEGATIVE mg/dL
Specific Gravity, Urine: 1.023 (ref 1.005–1.030)
pH: 5 (ref 5.0–8.0)

## 2017-11-05 LAB — CBC
HCT: 38.8 % (ref 35.0–47.0)
HEMOGLOBIN: 12.9 g/dL (ref 12.0–16.0)
MCH: 29.9 pg (ref 26.0–34.0)
MCHC: 33.3 g/dL (ref 32.0–36.0)
MCV: 89.7 fL (ref 80.0–100.0)
PLATELETS: 213 10*3/uL (ref 150–440)
RBC: 4.33 MIL/uL (ref 3.80–5.20)
RDW: 13.4 % (ref 11.5–14.5)
WBC: 5.8 10*3/uL (ref 3.6–11.0)

## 2017-11-05 LAB — SURGICAL PCR SCREEN
MRSA, PCR: NEGATIVE
Staphylococcus aureus: NEGATIVE

## 2017-11-05 NOTE — Patient Instructions (Signed)
Your procedure is scheduled on: Tuesday, November 20, 2017 Report to Same Day Surgery on the 2nd floor in the [facility]. To find out your arrival time, please call [phone] between 1PM - 3PM on: Monday, November 19, 2017  REMEMBER: Instructions that are not followed completely may result in serious medical risk, up to and including death; or upon the discretion of your surgeon and anesthesiologist your surgery may need to be rescheduled.  Do not eat food after midnight the night before your procedure.  No gum chewing or hard candies.  You may however, drink water only up to 2 hours before you are scheduled to arrive at the hospital for your procedure.  Do not drink water within 2 hours of the start of your surgery.  No Alcohol for 24 hours before or after surgery.  No Smoking including e-cigarettes for 24 hours prior to surgery. No chewable tobacco products for at least 6 hours prior to surgery. No nicotine patches on the day of surgery.  Notify your doctor if there is any change in your medical condition (cold, fever, infection).  Do not wear jewelry, make-up, hairpins, clips or nail polish.  Do not wear lotions, powders, or perfumes. You may wear deodorant.  Do not shave 48 hours prior to surgery.   Contacts and dentures may not be worn into surgery.  Do not bring valuables to the hospital. [facility] is not responsible for any belongings or valuables.   TAKE THESE MEDICATIONS THE MORNING OF SURGERY WITH A SIP OF WATER:  1.  Tramadol (if needed for pain)  Use CHG Soap as directed on instruction sheet.  Stop Metformin 2 days prior to surgery. Last dose on November 17, 2017. Resume after surgery.  On November 26. Stop Aspirin. Resume after surgery.  On November 26. Stop Anti-inflammatories such as Advil, Aleve, Ibuprofen, Motrin, Naproxen, Naprosyn, Goodie powder, or aspirin products. (May take Tylenol or Acetaminophen if needed.)  Stop ANY OVER THE COUNTER supplements  until after surgery. (May continue multivitamin.)  If you are being admitted to the hospital overnight, leave your suitcase in the car. After surgery it may be brought to your room.  Please call the number above if you have any questions about these instructions.

## 2017-11-06 LAB — URINE CULTURE: Culture: NO GROWTH

## 2017-11-06 NOTE — Pre-Procedure Instructions (Signed)
AS INSTRUCTED BY DR Gildardo Griffes, EKG FAXED TO PCP AND DR Roland Rack

## 2017-11-12 ENCOUNTER — Telehealth: Payer: Self-pay | Admitting: Urology

## 2017-11-12 NOTE — Telephone Encounter (Signed)
Pt came back in and said NO catheter at ALL during surgery.

## 2017-11-12 NOTE — Telephone Encounter (Signed)
Pt. Is scheduled 11/13/2017 at 10 am. [facility] unable to schedule pt. Pt. Wanted to be combative with me as I scheduled her appointment and  informed pt. In order to get surgical clarence she has to be seen by a provider.

## 2017-11-12 NOTE — Telephone Encounter (Signed)
Pt would like [name] samples 50 mg.  Please call pt if we get any this week.  She would like some before surgery on 12/4.

## 2017-11-12 NOTE — Telephone Encounter (Signed)
Copied from [place] [id number]. Topic: Inquiry >> Nov 12, 2017 12:13 PM [place], [place], [place] wrote: Reason for CRM: Patient received a call from the office last Friday for a surgical clearance and she has questions as to why she would need that because she already has the clearance and wanted to know if she still needs that appointment and would like it if Judeen Hammans or Coralyn Mark could give her a call back

## 2017-11-13 ENCOUNTER — Ambulatory Visit (INDEPENDENT_AMBULATORY_CARE_PROVIDER_SITE_OTHER): Payer: Medicare Other | Admitting: Family Medicine

## 2017-11-13 ENCOUNTER — Encounter: Payer: Self-pay | Admitting: Family Medicine

## 2017-11-13 VITALS — BP 109/64 | HR 60 | Temp 97.8°F | Ht 65.0 in | Wt 168.4 lb

## 2017-11-13 DIAGNOSIS — Z01818 Encounter for other preprocedural examination: Secondary | ICD-10-CM

## 2017-11-13 DIAGNOSIS — R9431 Abnormal electrocardiogram [ECG] [EKG]: Secondary | ICD-10-CM | POA: Diagnosis not present

## 2017-11-13 NOTE — Pre-Procedure Instructions (Signed)
CLEARED LOW RISK BY PCP, LOW RISK

## 2017-11-13 NOTE — Telephone Encounter (Signed)
Pt wanted to know if you got any [name] samples and she would come to our office to get them.  Please give pt a call.

## 2017-11-13 NOTE — Progress Notes (Signed)
BP 109/64 (BP Location: Right Arm, Patient Position: Sitting, Cuff Size: Normal)   Pulse 60   Temp 97.8 F (36.6 C) (Oral)   Ht 5\' 5"  (1.651 m)   Wt 168 lb 6.4 oz (76.4 kg)   SpO2 97%   BMI 28.02 kg/m    Subjective:    Patient ID: Stacy Mercado, female    DOB: [birth year], 71 y.o.   MRN: [id number]  HPI: Stacy Mercado is a 71 y.o. female  Chief Complaint  Patient presents with  . Surgical Clearance   Patient here today for PCP clearance prior to shoulder surgery scheduled 12/4 with Dr. Roland Rack. Completed pre-op testing at the hospital on 11/19 with normal results and was told at that time to go ahead and stop ASA. No concerns today. Did have abnormal EKG but it was consistent with most recent EKG.   Relevant past medical, surgical, family and social history reviewed and updated as indicated. Interim medical history since our last visit reviewed. Allergies and medications reviewed and updated.  Review of Systems  Constitutional: Negative.   HENT: Negative.   Respiratory: Negative.   Cardiovascular: Negative.   Gastrointestinal: Negative.   Genitourinary: Negative.   Musculoskeletal: Negative.   Neurological: Negative.   Hematological: Does not bruise/bleed easily.  Psychiatric/Behavioral: Negative.    Per HPI unless specifically indicated above     Objective:    BP 109/64 (BP Location: Right Arm, Patient Position: Sitting, Cuff Size: Normal)   Pulse 60   Temp 97.8 F (36.6 C) (Oral)   Ht 5\' 5"  (1.651 m)   Wt 168 lb 6.4 oz (76.4 kg)   SpO2 97%   BMI 28.02 kg/m   Wt Readings from Last 3 Encounters:  11/13/17 168 lb 6.4 oz (76.4 kg)  11/05/17 166 lb (75.3 kg)  08/07/17 163 lb (73.9 kg)    Physical Exam  Constitutional: She is oriented to person, place, and time. She appears well-developed and well-nourished. No distress.  HENT:  Head: Atraumatic.  Eyes: Conjunctivae are normal. Pupils are equal, round, and reactive to light.  Neck: Normal range of  motion. Neck supple.  Cardiovascular: Normal rate and normal heart sounds.  Pulmonary/Chest: Effort normal and breath sounds normal. No respiratory distress.  Musculoskeletal: Normal range of motion.  Neurological: She is alert and oriented to person, place, and time.  Skin: Skin is warm and dry.  Psychiatric: She has a normal mood and affect. Her behavior is normal.  Nursing note and vitals reviewed.  Results for orders placed or performed during the hospital encounter of 11/05/17  Urine culture  Result Value Ref Range   Specimen Description URINE, RANDOM    Special Requests NONE    Culture      NO GROWTH Performed at [facility], [place] [address], [place],  [address]    Report Status 11/06/2017 FINAL   Surgical pcr screen  Result Value Ref Range   MRSA, PCR NEGATIVE NEGATIVE   Staphylococcus aureus NEGATIVE NEGATIVE  CBC  Result Value Ref Range   WBC 5.8 3.6 - 11.0 K/uL   RBC 4.33 3.80 - 5.20 MIL/uL   Hemoglobin 12.9 12.0 - 16.0 g/dL   HCT 38.8 35.0 - 47.0 %   MCV 89.7 80.0 - 100.0 fL   MCH 29.9 26.0 - 34.0 pg   MCHC 33.3 32.0 - 36.0 g/dL   RDW 13.4 11.5 - 14.5 %   Platelets 213 150 - 440 K/uL  Basic metabolic panel  Result Value Ref Range   Sodium 140 135 - 145 mmol/L   Potassium 4.1 3.5 - 5.1 mmol/L   Chloride 104 101 - 111 mmol/L   CO2 29 22 - 32 mmol/L   Glucose, Bld 106 (H) 65 - 99 mg/dL   BUN 16 6 - 20 mg/dL   Creatinine, Ser 0.67 0.44 - 1.00 mg/dL   Calcium 10.4 (H) 8.9 - 10.3 mg/dL   GFR calc non Af Amer >60 >60 mL/min   GFR calc Af Amer >60 >60 mL/min   Anion gap 7 5 - 15  Protime-INR  Result Value Ref Range   Prothrombin Time 12.5 11.4 - 15.2 seconds   INR 0.94   Urinalysis, Routine w reflex microscopic  Result Value Ref Range   Color, Urine AMBER (A) YELLOW   APPearance HAZY (A) CLEAR   Specific Gravity, Urine 1.023 1.005 - 1.030   pH 5.0 5.0 - 8.0   Glucose, UA NEGATIVE NEGATIVE mg/dL   Hgb urine dipstick NEGATIVE NEGATIVE    Bilirubin Urine NEGATIVE NEGATIVE   Ketones, ur NEGATIVE NEGATIVE mg/dL   Protein, ur NEGATIVE NEGATIVE mg/dL   Nitrite NEGATIVE NEGATIVE   Leukocytes, UA NEGATIVE NEGATIVE  Type and screen [facility]  Result Value Ref Range   ABO/RH(D) A POS    Antibody Screen NEG    Sample Expiration 11/19/2017    Extend sample reason NO TRANSFUSIONS OR PREGNANCY IN THE PAST 3 MONTHS       Assessment & Plan:   Problem List Items Addressed This Visit    None    Visit Diagnoses    Pre-op exam    -  Primary   Paperwork filled out for surgical clearance, labs all stable. Pt already stopped ASA. Pt cleared for procedure from our standpoint.    Abnormal EKG       EKG repeated today, consistent with recent hospital findings. Stable from previous, underwent Cardiology work-up earlier this year with good results.    Relevant Orders   EKG 12-Lead (Completed)       Follow up plan: Return for as scheduled.

## 2017-11-14 ENCOUNTER — Telehealth: Payer: Self-pay | Admitting: Family Medicine

## 2017-11-14 MED ORDER — MIRABEGRON ER 50 MG PO TB24: 50.0000 mg | ORAL_TABLET | Freq: Every day | ORAL | 3 refills | Status: DC

## 2017-11-14 NOTE — Telephone Encounter (Signed)
Copied from [place]. Topic: Quick Communication - See Telephone Encounter >> Nov 14, 2017  1:47 PM Burnis Medin, NT wrote: CRM for notification. See Telephone encounter for: Pt. Called in about wanting to see if it's ok for her to get shingled vaccination before her surgery. Pt would like a call back.    11/14/17.

## 2017-11-14 NOTE — Telephone Encounter (Signed)
Spoke with pt and made aware myrbetriq samples have been left up front. A script was sent to pharmacy and another PA will be completed. Pt voiced understanding.

## 2017-11-15 NOTE — Telephone Encounter (Signed)
Called and left patient a detailed VM letting her know what Dr. Wynetta Emery said (signed DPR to leave detailed message). Asked for patient to call back with any questions.

## 2017-11-15 NOTE — Telephone Encounter (Signed)
I would have her wait for 2 weeks after her surgery only because her surgery is so soon and the surgery can lower her immune system.

## 2017-11-15 NOTE — Patient Instructions (Signed)
Follow up as needed

## 2017-11-19 MED ORDER — CEFAZOLIN SODIUM-DEXTROSE 2-4 GM/100ML-% IV SOLN
2.0000 g | Freq: Once | INTRAVENOUS | Status: AC
  Administered 2017-11-20: 2 g via INTRAVENOUS

## 2017-11-20 ENCOUNTER — Inpatient Hospital Stay: Payer: Medicare Other

## 2017-11-20 ENCOUNTER — Inpatient Hospital Stay
Admission: RE | Admit: 2017-11-20 | Discharge: 2017-11-21 | DRG: 483 | Disposition: A | Discharge status: Home / Self Care | Payer: Medicare Other | Source: Ambulatory Visit | Attending: Surgery | Admitting: Surgery

## 2017-11-20 ENCOUNTER — Encounter: Payer: Self-pay | Admitting: *Deleted

## 2017-11-20 ENCOUNTER — Other Ambulatory Visit: Payer: Self-pay

## 2017-11-20 ENCOUNTER — Inpatient Hospital Stay: Payer: Medicare Other | Admitting: Certified Registered Nurse Anesthetist

## 2017-11-20 ENCOUNTER — Encounter
Admission: RE | Disposition: A | Discharge status: Home / Self Care | Payer: Self-pay | Source: Ambulatory Visit | Attending: Surgery

## 2017-11-20 DIAGNOSIS — Z471 Aftercare following joint replacement surgery: Secondary | ICD-10-CM | POA: Diagnosis not present

## 2017-11-20 DIAGNOSIS — E785 Hyperlipidemia, unspecified: Secondary | ICD-10-CM | POA: Diagnosis present

## 2017-11-20 DIAGNOSIS — G8929 Other chronic pain: Secondary | ICD-10-CM | POA: Diagnosis present

## 2017-11-20 DIAGNOSIS — M75121 Complete rotator cuff tear or rupture of right shoulder, not specified as traumatic: Principal | ICD-10-CM | POA: Diagnosis present

## 2017-11-20 DIAGNOSIS — E1151 Type 2 diabetes mellitus with diabetic peripheral angiopathy without gangrene: Secondary | ICD-10-CM | POA: Diagnosis present

## 2017-11-20 DIAGNOSIS — Z96611 Presence of right artificial shoulder joint: Secondary | ICD-10-CM

## 2017-11-20 DIAGNOSIS — Z87891 Personal history of nicotine dependence: Secondary | ICD-10-CM

## 2017-11-20 DIAGNOSIS — Z7984 Long term (current) use of oral hypoglycemic drugs: Secondary | ICD-10-CM

## 2017-11-20 DIAGNOSIS — Z8551 Personal history of malignant neoplasm of bladder: Secondary | ICD-10-CM | POA: Diagnosis not present

## 2017-11-20 DIAGNOSIS — I739 Peripheral vascular disease, unspecified: Secondary | ICD-10-CM | POA: Diagnosis present

## 2017-11-20 DIAGNOSIS — I10 Essential (primary) hypertension: Secondary | ICD-10-CM | POA: Diagnosis not present

## 2017-11-20 DIAGNOSIS — M545 Low back pain: Secondary | ICD-10-CM | POA: Diagnosis present

## 2017-11-20 DIAGNOSIS — M7581 Other shoulder lesions, right shoulder: Secondary | ICD-10-CM | POA: Diagnosis not present

## 2017-11-20 DIAGNOSIS — M25121 Fistula, right elbow: Secondary | ICD-10-CM | POA: Diagnosis not present

## 2017-11-20 DIAGNOSIS — Z79899 Other long term (current) drug therapy: Secondary | ICD-10-CM

## 2017-11-20 DIAGNOSIS — M1990 Unspecified osteoarthritis, unspecified site: Secondary | ICD-10-CM | POA: Diagnosis not present

## 2017-11-20 DIAGNOSIS — F329 Major depressive disorder, single episode, unspecified: Secondary | ICD-10-CM | POA: Diagnosis not present

## 2017-11-20 DIAGNOSIS — M779 Enthesopathy, unspecified: Secondary | ICD-10-CM | POA: Diagnosis not present

## 2017-11-20 DIAGNOSIS — E119 Type 2 diabetes mellitus without complications: Secondary | ICD-10-CM | POA: Diagnosis not present

## 2017-11-20 HISTORY — PX: REVERSE SHOULDER ARTHROPLASTY: SHX5054

## 2017-11-20 LAB — GLUCOSE, CAPILLARY
GLUCOSE-CAPILLARY: 121 mg/dL — AB (ref 65–99)
GLUCOSE-CAPILLARY: 148 mg/dL — AB (ref 65–99)
GLUCOSE-CAPILLARY: 165 mg/dL — AB (ref 65–99)
Glucose-Capillary: 103 mg/dL — ABNORMAL HIGH (ref 65–99)
Glucose-Capillary: 215 mg/dL — ABNORMAL HIGH (ref 65–99)

## 2017-11-20 LAB — TYPE AND SCREEN
ABO/RH(D): A POS
ANTIBODY SCREEN: NEGATIVE

## 2017-11-20 SURGERY — ARTHROPLASTY, SHOULDER, TOTAL, REVERSE
Anesthesia: General | Laterality: Right | Wound class: Clean

## 2017-11-20 MED ORDER — NEOMYCIN-POLYMYXIN B GU 40-200000 IR SOLN
Status: DC | PRN
  Administered 2017-11-20: 16 mL

## 2017-11-20 MED ORDER — PROPOFOL 10 MG/ML IV BOLUS
INTRAVENOUS | Status: DC | PRN
  Administered 2017-11-20: 150 mg via INTRAVENOUS
  Administered 2017-11-20: 50 mg via INTRAVENOUS

## 2017-11-20 MED ORDER — ONDANSETRON HCL 4 MG/2ML IJ SOLN: 4.0000 mg | Freq: Four times a day (QID) | INTRAMUSCULAR | Status: DC | PRN

## 2017-11-20 MED ORDER — PHENYLEPHRINE HCL 10 MG/ML IJ SOLN
INTRAVENOUS | Status: DC | PRN
  Administered 2017-11-20: 50 ug/min via INTRAVENOUS

## 2017-11-20 MED ORDER — FLEET ENEMA 7-19 GM/118ML RE ENEM: 1.0000 | ENEMA | Freq: Once | RECTAL | Status: DC | PRN

## 2017-11-20 MED ORDER — FENTANYL CITRATE (PF) 250 MCG/5ML IJ SOLN
INTRAMUSCULAR | Status: AC
Start: 2017-11-20 — End: ?
  Filled 2017-11-20: qty 5

## 2017-11-20 MED ORDER — ADULT MULTIVITAMIN W/MINERALS CH
1.0000 | ORAL_TABLET | Freq: Every day | ORAL | Status: DC
  Administered 2017-11-21: 1 via ORAL
  Filled 2017-11-20: qty 1

## 2017-11-20 MED ORDER — EPHEDRINE SULFATE 50 MG/ML IJ SOLN
INTRAMUSCULAR | Status: DC | PRN
Start: 2017-11-20 — End: 2017-11-20
  Administered 2017-11-20 (×2): 5 mg via INTRAVENOUS
  Administered 2017-11-20: 10 mg via INTRAVENOUS

## 2017-11-20 MED ORDER — BUPIVACAINE LIPOSOME 1.3 % IJ SUSP
INTRAMUSCULAR | Status: AC
  Filled 2017-11-20: qty 20

## 2017-11-20 MED ORDER — EPHEDRINE SULFATE 50 MG/ML IJ SOLN
INTRAMUSCULAR | Status: AC
  Filled 2017-11-20: qty 1

## 2017-11-20 MED ORDER — MIDAZOLAM HCL 2 MG/2ML IJ SOLN
1.0000 mg | Freq: Once | INTRAMUSCULAR | Status: AC
  Administered 2017-11-20: 1 mg via INTRAVENOUS

## 2017-11-20 MED ORDER — METOCLOPRAMIDE HCL 10 MG PO TABS: 5.0000 mg | ORAL_TABLET | Freq: Three times a day (TID) | ORAL | Status: DC | PRN

## 2017-11-20 MED ORDER — ONDANSETRON HCL 4 MG/2ML IJ SOLN
INTRAMUSCULAR | Status: DC | PRN
  Administered 2017-11-20: 4 mg via INTRAVENOUS

## 2017-11-20 MED ORDER — ACETAMINOPHEN 650 MG RE SUPP
650.0000 mg | RECTAL | Status: DC | PRN
Start: 2017-11-20 — End: 2017-11-21

## 2017-11-20 MED ORDER — ACETAMINOPHEN 325 MG PO TABS
650.0000 mg | ORAL_TABLET | ORAL | Status: DC | PRN
  Administered 2017-11-21: 650 mg via ORAL
  Filled 2017-11-20: qty 2

## 2017-11-20 MED ORDER — KETOROLAC TROMETHAMINE 15 MG/ML IJ SOLN
15.0000 mg | Freq: Once | INTRAMUSCULAR | Status: AC
  Administered 2017-11-20: 15 mg via INTRAVENOUS

## 2017-11-20 MED ORDER — KETOROLAC TROMETHAMINE 15 MG/ML IJ SOLN
INTRAMUSCULAR | Status: AC
  Filled 2017-11-20: qty 1

## 2017-11-20 MED ORDER — MAGNESIUM HYDROXIDE 400 MG/5ML PO SUSP: 30.0000 mL | Freq: Every day | ORAL | Status: DC | PRN

## 2017-11-20 MED ORDER — METFORMIN HCL 500 MG PO TABS
1000.0000 mg | ORAL_TABLET | Freq: Two times a day (BID) | ORAL | Status: DC
  Administered 2017-11-20 – 2017-11-21 (×2): 1000 mg via ORAL
  Filled 2017-11-20 (×3): qty 2

## 2017-11-20 MED ORDER — BUPIVACAINE HCL (PF) 0.5 % IJ SOLN
INTRAMUSCULAR | Status: AC
  Filled 2017-11-20: qty 30

## 2017-11-20 MED ORDER — BISACODYL 10 MG RE SUPP: 10.0000 mg | Freq: Every day | RECTAL | Status: DC | PRN

## 2017-11-20 MED ORDER — FENTANYL CITRATE (PF) 100 MCG/2ML IJ SOLN: 25.0000 ug | INTRAMUSCULAR | Status: DC | PRN

## 2017-11-20 MED ORDER — LIDOCAINE HCL (PF) 1 % IJ SOLN
INTRAMUSCULAR | Status: DC | PRN
  Administered 2017-11-20: 5 mL via SUBCUTANEOUS

## 2017-11-20 MED ORDER — FAMOTIDINE 20 MG PO TABS
20.0000 mg | ORAL_TABLET | Freq: Once | ORAL | Status: AC
  Administered 2017-11-20: 20 mg via ORAL

## 2017-11-20 MED ORDER — TRANEXAMIC ACID 1000 MG/10ML IV SOLN
INTRAVENOUS | Status: AC
  Filled 2017-11-20: qty 10

## 2017-11-20 MED ORDER — METOCLOPRAMIDE HCL 5 MG/ML IJ SOLN: 5.0000 mg | Freq: Three times a day (TID) | INTRAMUSCULAR | Status: DC | PRN

## 2017-11-20 MED ORDER — ROCURONIUM BROMIDE 50 MG/5ML IV SOLN
INTRAVENOUS | Status: AC
  Filled 2017-11-20: qty 1

## 2017-11-20 MED ORDER — FENTANYL CITRATE (PF) 100 MCG/2ML IJ SOLN
50.0000 ug | Freq: Once | INTRAMUSCULAR | Status: AC
  Administered 2017-11-20: 50 ug via INTRAVENOUS

## 2017-11-20 MED ORDER — FENTANYL CITRATE (PF) 100 MCG/2ML IJ SOLN
INTRAMUSCULAR | Status: AC
  Administered 2017-11-20: 50 ug via INTRAVENOUS
  Filled 2017-11-20: qty 2

## 2017-11-20 MED ORDER — ROCURONIUM BROMIDE 100 MG/10ML IV SOLN
INTRAVENOUS | Status: DC | PRN
  Administered 2017-11-20: 20 mg via INTRAVENOUS
  Administered 2017-11-20: 50 mg via INTRAVENOUS

## 2017-11-20 MED ORDER — BUPIVACAINE LIPOSOME 1.3 % IJ SUSP
INTRAMUSCULAR | Status: DC | PRN
  Administered 2017-11-20: 20 mL via PERINEURAL

## 2017-11-20 MED ORDER — LIDOCAINE HCL (PF) 2 % IJ SOLN
INTRAMUSCULAR | Status: AC
  Filled 2017-11-20: qty 10

## 2017-11-20 MED ORDER — CEFAZOLIN SODIUM-DEXTROSE 2-4 GM/100ML-% IV SOLN
INTRAVENOUS | Status: AC
  Filled 2017-11-20: qty 100

## 2017-11-20 MED ORDER — LIDOCAINE HCL (PF) 1 % IJ SOLN
INTRAMUSCULAR | Status: AC
  Filled 2017-11-20: qty 5

## 2017-11-20 MED ORDER — BUPIVACAINE-EPINEPHRINE (PF) 0.5% -1:200000 IJ SOLN
INTRAMUSCULAR | Status: DC | PRN
  Administered 2017-11-20: 30 mL via PERINEURAL

## 2017-11-20 MED ORDER — BACLOFEN 10 MG PO TABS
10.0000 mg | ORAL_TABLET | Freq: Two times a day (BID) | ORAL | Status: DC | PRN
  Filled 2017-11-20: qty 1

## 2017-11-20 MED ORDER — LIDOCAINE HCL (CARDIAC) 20 MG/ML IV SOLN
INTRAVENOUS | Status: DC | PRN
  Administered 2017-11-20: 100 mg via INTRAVENOUS

## 2017-11-20 MED ORDER — GABAPENTIN 300 MG PO CAPS
300.0000 mg | ORAL_CAPSULE | Freq: Every day | ORAL | Status: DC
  Administered 2017-11-20: 300 mg via ORAL
  Filled 2017-11-20: qty 1

## 2017-11-20 MED ORDER — DIPHENHYDRAMINE HCL 12.5 MG/5ML PO ELIX: 12.5000 mg | ORAL_SOLUTION | ORAL | Status: DC | PRN

## 2017-11-20 MED ORDER — BUPIVACAINE-EPINEPHRINE (PF) 0.5% -1:200000 IJ SOLN
INTRAMUSCULAR | Status: AC
  Filled 2017-11-20: qty 30

## 2017-11-20 MED ORDER — HYDROMORPHONE HCL 1 MG/ML IJ SOLN: 0.5000 mg | INTRAMUSCULAR | Status: DC | PRN

## 2017-11-20 MED ORDER — PANTOPRAZOLE SODIUM 40 MG PO TBEC
40.0000 mg | DELAYED_RELEASE_TABLET | Freq: Every day | ORAL | Status: DC
  Administered 2017-11-21: 40 mg via ORAL
  Filled 2017-11-20 (×3): qty 1

## 2017-11-20 MED ORDER — SODIUM CHLORIDE 0.9 % IV SOLN
INTRAVENOUS | Status: DC
  Administered 2017-11-20 (×2): via INTRAVENOUS

## 2017-11-20 MED ORDER — DEXAMETHASONE SODIUM PHOSPHATE 10 MG/ML IJ SOLN
INTRAMUSCULAR | Status: AC
  Filled 2017-11-20: qty 1

## 2017-11-20 MED ORDER — FENTANYL CITRATE (PF) 100 MCG/2ML IJ SOLN
INTRAMUSCULAR | Status: DC | PRN
  Administered 2017-11-20: 50 ug via INTRAVENOUS
  Administered 2017-11-20: 100 ug via INTRAVENOUS

## 2017-11-20 MED ORDER — MIRABEGRON ER 50 MG PO TB24
50.0000 mg | ORAL_TABLET | Freq: Every day | ORAL | Status: DC
  Administered 2017-11-21: 50 mg via ORAL
  Filled 2017-11-20: qty 1

## 2017-11-20 MED ORDER — DEXTROSE 5 % IV SOLN
2.0000 g | Freq: Four times a day (QID) | INTRAVENOUS | Status: AC
  Administered 2017-11-20 – 2017-11-21 (×3): 2 g via INTRAVENOUS
  Filled 2017-11-20 (×3): qty 2000

## 2017-11-20 MED ORDER — DOCUSATE SODIUM 100 MG PO CAPS
100.0000 mg | ORAL_CAPSULE | Freq: Two times a day (BID) | ORAL | Status: DC
  Administered 2017-11-20 – 2017-11-21 (×2): 100 mg via ORAL
  Filled 2017-11-20 (×3): qty 1

## 2017-11-20 MED ORDER — GLYCOPYRROLATE 0.2 MG/ML IJ SOLN
INTRAMUSCULAR | Status: AC
  Filled 2017-11-20: qty 1

## 2017-11-20 MED ORDER — ENOXAPARIN SODIUM 40 MG/0.4ML ~~LOC~~ SOLN
40.0000 mg | SUBCUTANEOUS | Status: DC
  Filled 2017-11-20: qty 0.4

## 2017-11-20 MED ORDER — MIDAZOLAM HCL 2 MG/2ML IJ SOLN
INTRAMUSCULAR | Status: AC
Start: 2017-11-20 — End: 2017-11-20
  Administered 2017-11-20: 1 mg via INTRAVENOUS
  Filled 2017-11-20: qty 2

## 2017-11-20 MED ORDER — KETOROLAC TROMETHAMINE 15 MG/ML IJ SOLN
7.5000 mg | Freq: Four times a day (QID) | INTRAMUSCULAR | Status: AC
  Administered 2017-11-20 – 2017-11-21 (×4): 7.5 mg via INTRAVENOUS
  Filled 2017-11-20 (×4): qty 1

## 2017-11-20 MED ORDER — OXYCODONE HCL 5 MG PO TABS: 5.0000 mg | ORAL_TABLET | ORAL | Status: DC | PRN

## 2017-11-20 MED ORDER — ESCITALOPRAM OXALATE 10 MG PO TABS
10.0000 mg | ORAL_TABLET | Freq: Every day | ORAL | Status: DC
  Administered 2017-11-20 – 2017-11-21 (×2): 10 mg via ORAL
  Filled 2017-11-20 (×2): qty 1

## 2017-11-20 MED ORDER — ONDANSETRON HCL 4 MG/2ML IJ SOLN: 4.0000 mg | Freq: Once | INTRAMUSCULAR | Status: DC | PRN

## 2017-11-20 MED ORDER — SODIUM CHLORIDE 0.9 % IJ SOLN
INTRAMUSCULAR | Status: AC
  Filled 2017-11-20: qty 50

## 2017-11-20 MED ORDER — ACETAMINOPHEN 500 MG PO TABS
1000.0000 mg | ORAL_TABLET | Freq: Four times a day (QID) | ORAL | Status: AC
  Administered 2017-11-20 – 2017-11-21 (×4): 1000 mg via ORAL
  Filled 2017-11-20 (×4): qty 2

## 2017-11-20 MED ORDER — ONDANSETRON HCL 4 MG/2ML IJ SOLN
INTRAMUSCULAR | Status: AC
  Filled 2017-11-20: qty 2

## 2017-11-20 MED ORDER — BUPIVACAINE HCL (PF) 0.5 % IJ SOLN
INTRAMUSCULAR | Status: DC | PRN
  Administered 2017-11-20: 10 mL via PERINEURAL

## 2017-11-20 MED ORDER — LOSARTAN POTASSIUM 50 MG PO TABS
50.0000 mg | ORAL_TABLET | Freq: Every day | ORAL | Status: DC
  Administered 2017-11-21: 50 mg via ORAL
  Filled 2017-11-20: qty 1

## 2017-11-20 MED ORDER — PHENYLEPHRINE HCL 10 MG/ML IJ SOLN
INTRAMUSCULAR | Status: AC
  Filled 2017-11-20: qty 1

## 2017-11-20 MED ORDER — TRANEXAMIC ACID 1000 MG/10ML IV SOLN
INTRAVENOUS | Status: DC | PRN
  Administered 2017-11-20: 1000 mg via INTRAVENOUS

## 2017-11-20 MED ORDER — ASPIRIN EC 81 MG PO TBEC
81.0000 mg | DELAYED_RELEASE_TABLET | Freq: Every day | ORAL | Status: DC
  Administered 2017-11-20: 81 mg via ORAL
  Filled 2017-11-20 (×2): qty 1

## 2017-11-20 MED ORDER — POTASSIUM CHLORIDE IN NACL 20-0.9 MEQ/L-% IV SOLN
INTRAVENOUS | Status: DC
  Administered 2017-11-20 – 2017-11-21 (×2): via INTRAVENOUS
  Filled 2017-11-20 (×3): qty 1000

## 2017-11-20 MED ORDER — CEFAZOLIN SODIUM-DEXTROSE 2-4 GM/100ML-% IV SOLN: 2.0000 g | Freq: Four times a day (QID) | INTRAVENOUS | Status: DC

## 2017-11-20 MED ORDER — ONDANSETRON HCL 4 MG PO TABS: 4.0000 mg | ORAL_TABLET | Freq: Four times a day (QID) | ORAL | Status: DC | PRN

## 2017-11-20 MED ORDER — SUGAMMADEX SODIUM 200 MG/2ML IV SOLN
INTRAVENOUS | Status: DC | PRN
  Administered 2017-11-20: 100 mg via INTRAVENOUS

## 2017-11-20 MED ORDER — SODIUM CHLORIDE 0.9 % IV SOLN
INTRAVENOUS | Status: DC | PRN
  Administered 2017-11-20: 60 mL

## 2017-11-20 MED ORDER — SUGAMMADEX SODIUM 500 MG/5ML IV SOLN
INTRAVENOUS | Status: AC
  Filled 2017-11-20: qty 5

## 2017-11-20 MED ORDER — OXYCODONE HCL 5 MG PO TABS: 10.0000 mg | ORAL_TABLET | ORAL | Status: DC | PRN

## 2017-11-20 MED ORDER — GLYCOPYRROLATE 0.2 MG/ML IJ SOLN
INTRAMUSCULAR | Status: DC | PRN
  Administered 2017-11-20: 0.1 mg via INTRAVENOUS

## 2017-11-20 MED ORDER — FAMOTIDINE 20 MG PO TABS
ORAL_TABLET | ORAL | Status: AC
  Administered 2017-11-20: 20 mg via ORAL
  Filled 2017-11-20: qty 1

## 2017-11-20 MED ORDER — NEOMYCIN-POLYMYXIN B GU 40-200000 IR SOLN
Status: AC
  Filled 2017-11-20: qty 20

## 2017-11-20 MED ORDER — PROPOFOL 10 MG/ML IV BOLUS
INTRAVENOUS | Status: AC
  Filled 2017-11-20: qty 20

## 2017-11-20 SURGICAL SUPPLY — 63 items
BIT DRILL TWIST 2.7 (BIT) ×2 IMPLANT
BIT DRILL TWIST 2.7MM (BIT) ×1
BLADE SAGITTAL WIDE XTHICK NO (BLADE) ×3 IMPLANT
CANISTER SUCT 1200ML W/VALVE (MISCELLANEOUS) ×3 IMPLANT
CANISTER SUCT 3000ML PPV (MISCELLANEOUS) ×6 IMPLANT
CAPT SHLDR REVTOTAL 2 ×3 IMPLANT
CHLORAPREP W/TINT 26ML (MISCELLANEOUS) ×3 IMPLANT
COOLER POLAR GLACIER W/PUMP (MISCELLANEOUS) ×3 IMPLANT
CRADLE LAMINECT ARM (MISCELLANEOUS) ×3 IMPLANT
DRAPE IMP U-DRAPE 54X76 (DRAPES) ×6 IMPLANT
DRAPE INCISE IOBAN 66X45 STRL (DRAPES) ×6 IMPLANT
DRAPE INCISE IOBAN 66X60 STRL (DRAPES) ×3 IMPLANT
DRAPE SHEET LG 3/4 BI-LAMINATE (DRAPES) ×6 IMPLANT
DRAPE TABLE BACK 80X90 (DRAPES) ×3 IMPLANT
DRSG OPSITE POSTOP 4X8 (GAUZE/BANDAGES/DRESSINGS) ×3 IMPLANT
ELECT CAUTERY BLADE 6.4 (BLADE) ×3 IMPLANT
GLOVE BIO SURGEON STRL SZ7.5 (GLOVE) ×12 IMPLANT
GLOVE BIO SURGEON STRL SZ8 (GLOVE) ×12 IMPLANT
GLOVE BIOGEL PI IND STRL 8 (GLOVE) ×1 IMPLANT
GLOVE BIOGEL PI INDICATOR 8 (GLOVE) ×2
GLOVE INDICATOR 8.0 STRL GRN (GLOVE) ×3 IMPLANT
GOWN STRL REUS W/ TWL LRG LVL3 (GOWN DISPOSABLE) ×1 IMPLANT
GOWN STRL REUS W/ TWL XL LVL3 (GOWN DISPOSABLE) ×1 IMPLANT
GOWN STRL REUS W/TWL LRG LVL3 (GOWN DISPOSABLE) ×2
GOWN STRL REUS W/TWL XL LVL3 (GOWN DISPOSABLE) ×2
HOOD PEEL AWAY FLYTE STAYCOOL (MISCELLANEOUS) ×9 IMPLANT
KIT RM TURNOVER STRD PROC AR (KITS) ×3 IMPLANT
KIT STABILIZATION SHOULDER (MISCELLANEOUS) ×3 IMPLANT
MASK FACE SPIDER DISP (MASK) ×3 IMPLANT
NDL MAYO CATGUT SZ1 (NEEDLE)
NDL MAYO CATGUT SZ5 (NEEDLE)
NDL SAFETY ECLIPSE 18X1.5 (NEEDLE) ×1 IMPLANT
NDL SUT 5 .5 CRC TPR PNT MAYO (NEEDLE) IMPLANT
NEEDLE HYPO 18GX1.5 SHARP (NEEDLE) ×2
NEEDLE HYPO 22GX1.5 SAFETY (NEEDLE) ×3 IMPLANT
NEEDLE HYPO 25X1 1.5 SAFETY (NEEDLE) ×3 IMPLANT
NEEDLE MAYO CATGUT SZ1 (NEEDLE) IMPLANT
NEEDLE MAYO CATGUT SZ4 (NEEDLE) IMPLANT
NEEDLE SPNL 20GX3.5 QUINCKE YW (NEEDLE) ×3 IMPLANT
NS IRRIG 500ML POUR BTL (IV SOLUTION) ×3 IMPLANT
PACK ARTHROSCOPY SHOULDER (MISCELLANEOUS) ×3 IMPLANT
PAD WRAPON POLAR SHDR UNIV (MISCELLANEOUS) ×1 IMPLANT
PIN THREADED REVERSE (PIN) ×3 IMPLANT
PULSAVAC PLUS IRRIG FAN TIP (DISPOSABLE) ×3
SCREW BONE LOCKING 4.75X35X3.5 (Screw) ×3 IMPLANT
SLING ULTRA II M (MISCELLANEOUS) ×3 IMPLANT
SOL .9 NS 3000ML IRR  AL (IV SOLUTION) ×2
SOL .9 NS 3000ML IRR UROMATIC (IV SOLUTION) ×1 IMPLANT
SPONGE LAP 18X18 5 PK (GAUZE/BANDAGES/DRESSINGS) ×3 IMPLANT
STAPLER SKIN PROX 35W (STAPLE) ×3 IMPLANT
SUT ETHIBOND 0 MO6 C/R (SUTURE) ×3 IMPLANT
SUT FIBERWIRE #2 38 BLUE 1/2 (SUTURE) ×12
SUT VIC AB 0 CT1 36 (SUTURE) ×6 IMPLANT
SUT VIC AB 2-0 CT1 27 (SUTURE) ×4
SUT VIC AB 2-0 CT1 TAPERPNT 27 (SUTURE) ×2 IMPLANT
SUTURE FIBERWR #2 38 BLUE 1/2 (SUTURE) ×4 IMPLANT
SYR 10ML LL (SYRINGE) ×3 IMPLANT
SYR 30ML LL (SYRINGE) ×6 IMPLANT
TIP FAN IRRIG PULSAVAC PLUS (DISPOSABLE) ×1 IMPLANT
TRAY FOLEY W/METER SILVER 16FR (SET/KITS/TRAYS/PACK) ×3 IMPLANT
TUBING CONNECTING 10 (TUBING) ×2 IMPLANT
TUBING CONNECTING 10' (TUBING) ×1
WRAPON POLAR PAD SHDR UNIV (MISCELLANEOUS) ×3

## 2017-11-20 NOTE — H&P (Signed)
Paper H&P to be scanned into permanent record. H&P reviewed and patient re-examined. No changes. 

## 2017-11-20 NOTE — Op Note (Signed)
11/20/2017  9:43 AM  Patient:   Stacy Mercado  Pre-Op Diagnosis:   Massive irreparable rotator cuff tear, right shoulder.  Post-Op Diagnosis:   Same  Procedure:   Reverse right total shoulder arthroplasty.  Surgeon:   Pascal Lux, MD  Assistant:   Cameron Proud, PA-C  Anesthesia:   General endotracheal with an interscalene block placed preoperatively by the anesthesiologist.  Findings:   As above.  Complications:   None  EBL: 250 cc  Fluids:   1150 cc crystalloid  UOP:   None  TT:   None  Drains:   None  Closure:   Staples  Implants:   All press-fit Biomet Comprehensive system with a #13 micro-humeral stem, a 44 mm humeral tray with a standard insert, and a mini-base plate with a 36 mm glenosphere.  Brief Clinical Note:   The patient is a 71 year old female with a long history of progressively worsening right shoulder pain and weakness.  The patient underwent an attempted repair of a massive rotator cuff tear of her right shoulder in February, 2018.  Despite extensive physical therapy, she continued to have difficulty with raising her arm above shoulder level.  A subsequent MRI scan demonstrated a recurrent massive rotator cuff tear. The patient presents at this time for a reverse right total shoulder arthroplasty.  Procedure:   The patient underwent placement of an interscalene block by the anesthesiologist in the preoperative holding area before being brought into the operating room and lain in the supine position. The patient then underwent general endotracheal intubation and anesthesia before the patient was repositioned in the beach chair position using the beach chair positioner. The right shoulder and upper extremity were prepped with ChloraPrep solution before being draped sterilely. Preoperative antibiotics were administered. A standard anterior approach to the shoulder was made through an approximately 4-5 inch incision. The incision was carried down through the  subcutaneous tissues to expose the deltopectoral fascia. The interval between the deltoid and pectoralis muscles was identified and this plane developed, retracting the cephalic vein laterally with the deltoid muscle. The conjoined tendon was identified. Its lateral margin was dissected and the Kolbel self-retraining retractor inserted. The "three sisters" were identified and cauterized. Bursal tissues were removed to improve visualization. The superior two thirds of the subscapularis tendon was noted to be torn and retracted to the humeral head.  The remaining portion of the subscapularis tendon was released from its attachment to the lesser tuberosity 1 cm proximal to its insertion and several tagging sutures placed. The inferior capsule was released with care after identifying and protecting the axillary nerve. The proximal humeral cut was made at approximately 20 of retroversion using the extra-medullary guide.   Attention was redirected to the glenoid. The labrum was debrided circumferentially before the center of the glenoid was marked with electrocautery. The guidewire was drilled into the glenoid neck using the appropriate guide. After verifying its position, it was overreamed with the mini-baseplate reamer to create a flat surface. The permanent mini-baseplate was impacted into place. It was stabilized with a 30 x 6.5 mm central screw and four peripheral screws. Locking screws were placed superiorly and inferiorly while nonlocking screws were placed anteriorly and posteriorly. The permanent 36 mm glenosphere was then impacted into place and its Morse taper locking mechanism verified using manual distraction.  Attention was directed to the humeral side. The humeral canal was reamed sequentially beginning with the end-cutting reamer then progressing from a 4 mm reamer up to a  13 mm reamer. This provided excellent circumferential chatter. The canal was broached beginning with a #10 broach and progressing  to a #13 broach. This was left in place and a trial reduction performed using the standard trial humeral platform. The arm demonstrated excellent range of motion as the hand could be brought across the chest to the opposite shoulder and brought to the top of the patient's head and to the patient's ear. The shoulder appeared stable throughout this range of motion. The joint was dislocated and the trial components removed. The permanent #13 micro-stem was impacted into place with care taken to maintain the appropriate version. The permanent 44 mm humeral platform with the standard insert was put together on the back table and impacted into place. Again, the [facility] taper locking mechanism was verified using manual distraction. The shoulder was relocated using two finger pressure and again placed through a range of motion with the findings as described above.  The wound was copiously irrigated with bacitracin saline solution using the jet lavage system before a total of 20 cc of Exparel diluted out to 60 cc with normal saline and 30 cc of 0.5% Sensorcaine with epinephrine was injected into the pericapsular and peri-incisional tissues to help with postoperative analgesia. The subscapularis tendon was reapproximated using #2 FiberWire interrupted sutures. The deltopectoral interval was closed using #0 Vicryl interrupted sutures before the subcutaneous tissues were closed using 2-0 Vicryl interrupted sutures. The skin was closed using staples. Prior to closing the skin, 1 g of transexemic acid in 10 cc of normal saline was injected intra-articularly to help with postoperative bleeding. A sterile occlusive dressing was applied to the wound before the arm was placed into a shoulder immobilizer with an abduction pillow. A Polar Care system also was applied to the shoulder. The patient was then transferred back to a hospital bed before being awakened, extubated, and returned to the recovery room in satisfactory condition  after tolerating the procedure well.

## 2017-11-20 NOTE — Anesthesia Post-op Follow-up Note (Signed)
Anesthesia QCDR form completed.        

## 2017-11-20 NOTE — Anesthesia Procedure Notes (Signed)
Anesthesia Regional Block: Interscalene brachial plexus block   Pre-Anesthetic Checklist: ,, timeout performed, Correct Patient, Correct Site, Correct Laterality, Correct Procedure, Correct Position, site marked, Risks and benefits discussed,  Surgical consent,  Pre-op evaluation,  At surgeon's request and post-op pain management  Laterality: Right and Upper  Prep: chloraprep       Needles:  Injection technique: Single-shot  Needle Type: Stimiplex     Needle Length: 5cm  Needle Gauge: 22     Additional Needles:   Procedures:,,,, ultrasound used (permanent image in chart),,,,  Narrative:  Start time: 11/20/2017 7:23 AM End time: 11/20/2017 7:28 AM Injection made incrementally with aspirations every 5 mL.  Performed by: Personally  Anesthesiologist: Martha Clan, MD  Additional Notes: Functioning IV was confirmed and monitors were applied.  A 51mm 22ga Stimuplex needle was used. Sterile prep and drape,hand hygiene and sterile gloves were used.  Negative aspiration and negative test dose prior to incremental administration of local anesthetic. The patient tolerated the procedure well.

## 2017-11-20 NOTE — Evaluation (Signed)
Physical Therapy Evaluation Patient Details Name: Stacy Mercado MRN: [id number] DOB: [birth year] Today's Date: 11/20/2017   History of Present Illness  Pt is a 71 y.o. female s/p R reverse total shoulder arthroplasty secondary to massive irreparable rotator cuff tear 11/20/17.  PMH includes htn, chronic LBP, bladder CA, and DM.  Clinical Impression  Prior to hospital admission, pt was independent.  Pt lives with her brother in 1 level home with steps to enter.  Currently pt is SBA supine to sit, CGA with transfers, and CGA (occasionally min assist to steady) with ambulation around nursing loop (no assistive device).  Pt would benefit from skilled PT to address noted impairments and functional limitations (see below for any additional details).  Upon hospital discharge, recommend pt discharge to home with support of family.    Follow Up Recommendations DC plan and follow up therapy as arranged by surgeon    Equipment Recommendations  None recommended by PT    Recommendations for Other Services OT consult     Precautions / Restrictions Precautions Precautions: Fall;Shoulder Shoulder Interventions: Shoulder sling/immobilizer;Shoulder abduction pillow Precaution Booklet Issued: No Restrictions Weight Bearing Restrictions: Yes RUE Weight Bearing: Non weight bearing      Mobility  Bed Mobility Overal bed mobility: Needs Assistance Bed Mobility: Supine to Sit     Supine to sit: Supervision;HOB elevated     General bed mobility comments: no difficulties noted with HOB elevated supine to sit  Transfers Overall transfer level: Needs assistance Equipment used: None Transfers: Sit to/from [facility] Sit to Stand: Min guard Stand pivot transfers: Min guard       General transfer comment: strong stand and transfer to [facility]  Ambulation/Gait Ambulation/Gait assistance: Min guard;Min assist Ambulation Distance (Feet): 220 Feet Assistive device: None Gait  Pattern/deviations: Step-through pattern Gait velocity: mildly decreased   General Gait Details: mildly unsteady with occasional min assist to steady (mostly CGA)  Stairs            Wheelchair Mobility    Modified Rankin (Stroke Patients Only)       Balance Overall balance assessment: History of Falls;Needs assistance Sitting-balance support: No upper extremity supported;Feet supported Sitting balance-Leahy Scale: Good Sitting balance - Comments: sitting reaching within BOS with L UE steady   Standing balance support: No upper extremity supported Standing balance-Leahy Scale: Good Standing balance comment: standing reaching within BOS with L UE steady                             Pertinent Vitals/Pain Pain Assessment: No/denies pain  Vitals (HR and O2 on room air) stable and WFL throughout treatment session.    Home Living Family/patient expects to be discharged to:: Private residence Living Arrangements: Other relatives(Pt's brother) Available Help at Discharge: Family Type of Home: House Home Access: Stairs to enter Entrance Stairs-Rails: Psychiatric nurse of Steps: [place]: One level        Prior Function Level of Independence: Independent         Comments: Pt reports h/o falls (most recent tripping on root outside on Nov 19th).     Hand Dominance        Extremity/Trunk Assessment   Upper Extremity Assessment Upper Extremity Assessment: Defer to OT evaluation    Lower Extremity Assessment Lower Extremity Assessment: Overall WFL for tasks assessed    Cervical / Trunk Assessment Cervical / Trunk Assessment: Normal  Communication   Communication: No difficulties  Cognition Arousal/Alertness: Awake/alert Behavior During Therapy: WFL for tasks assessed/performed Overall Cognitive Status: Within Functional Limits for tasks assessed                                        General Comments  General comments (skin integrity, edema, etc.): Pt resting in bed with shoulder immobilizer with abduction pillow in place as well as polar care in place.  Nursing cleared pt for participation in physical therapy.  Pt agreeable to PT session.    Exercises     Assessment/Plan    PT Assessment Patient needs continued PT services  PT Problem List Decreased balance;Decreased mobility;Decreased knowledge of precautions;Pain       PT Treatment Interventions DME instruction;Gait training;Stair training;Functional mobility training;Therapeutic activities;Therapeutic exercise;Balance training;Patient/family education    PT Goals (Current goals can be found in the Care Plan section)  Acute Rehab PT Goals Patient Stated Goal: to go home PT Goal Formulation: With patient Time For Goal Achievement: 12/04/17 Potential to Achieve Goals: Good    Frequency BID   Barriers to discharge        Co-evaluation               AM-PAC PT "6 Clicks" Daily Activity  Outcome Measure Difficulty turning over in bed (including adjusting bedclothes, sheets and blankets)?: A Little Difficulty moving from lying on back to sitting on the side of the bed? : A Lot Difficulty sitting down on and standing up from a chair with arms (e.g., wheelchair, bedside commode, etc,.)?: A Little Help needed moving to and from a bed to chair (including a wheelchair)?: A Little Help needed walking in hospital room?: A Little Help needed climbing 3-5 steps with a railing? : A Little 6 Click Score: 17    End of Session Equipment Utilized During Treatment: Gait belt;Other (comment)(R shoulder immobilizer with abduction pillow) Activity Tolerance: Patient tolerated treatment well Patient left: in chair;with call bell/phone within reach;with chair alarm set;with SCD's reapplied(pillow placed under pt's R UE for support; polar care in place and activated) Nurse Communication: Mobility status;Precautions;Weight bearing  status PT Visit Diagnosis: Unsteadiness on feet (R26.81);History of falling (Z91.81)    Time: 5498-2641 PT Time Calculation (min) (ACUTE ONLY): 28 min   Charges:   PT Evaluation $PT Eval Low Complexity: 1 Low PT Treatments $Therapeutic Exercise: 8-22 mins   PT G Codes:   PT G-Codes **NOT FOR INPATIENT CLASS** Functional Assessment Tool Used: AM-PAC 6 Clicks Basic Mobility Functional Limitation: Mobility: Walking and moving around Mobility: Walking and Moving Around Current Status (R8309): At least 40 percent but less than 60 percent impaired, limited or restricted Mobility: Walking and Moving Around Goal Status [phone]): 0 percent impaired, limited or restricted    Leitha Bleak, PT 11/20/17, 5:01 PM [phone]

## 2017-11-20 NOTE — Anesthesia Preprocedure Evaluation (Signed)
Anesthesia Evaluation  Patient identified by MRN, date of birth, ID band Patient awake    Reviewed: Allergy & Precautions, NPO status , Patient's Chart, lab work & pertinent test results  History of Anesthesia Complications Negative for: history of anesthetic complications  Airway Mallampati: III  TM Distance: >3 FB Neck ROM: Full    Dental  (+) Upper Dentures   Pulmonary neg sleep apnea, neg COPD, former smoker,    breath sounds clear to auscultation- rhonchi (-) wheezing      Cardiovascular Exercise Tolerance: Good hypertension, Pt. on medications (-) CAD and (-) Past MI  Rhythm:Regular Rate:Normal - Systolic murmurs and - Diastolic murmurs    Neuro/Psych PSYCHIATRIC DISORDERS negative neurological ROS     GI/Hepatic negative GI ROS, Neg liver ROS,   Endo/Other  diabetes, Oral Hypoglycemic Agents  Renal/GU negative Renal ROS     Musculoskeletal  (+) Arthritis ,   Abdominal (+) - obese,   Peds  Hematology negative hematology ROS (+)   Anesthesia Other Findings Past Medical History: No date: Bladder cancer (HCC) No date: Chronic low back pain No date: Depression No date: Diabetes mellitus without complication ([place]) 5/42/7062: Hyperlipidemia No date: Hypertension No date: PVD (peripheral vascular disease) ([place]) 08/07/2016: Urge incontinence of urine   Reproductive/Obstetrics                             Anesthesia Physical  Anesthesia Plan  ASA: III  Anesthesia Plan: General   Post-op Pain Management:  Regional for Post-op pain   Induction: Intravenous  PONV Risk Score and Plan: 3 and Ondansetron and Dexamethasone  Airway Management Planned: Oral ETT  Additional Equipment:   Intra-op Plan:   Post-operative Plan: Extubation in OR  Informed Consent: I have reviewed the patients History and Physical, chart, labs and discussed the procedure including the risks, benefits  and alternatives for the proposed anesthesia with the patient or authorized representative who has indicated his/her understanding and acceptance.   Dental advisory given  Plan Discussed with: CRNA and Anesthesiologist  Anesthesia Plan Comments:         Anesthesia Quick Evaluation

## 2017-11-20 NOTE — Transfer of Care (Signed)
Immediate Anesthesia Transfer of Care Note  Patient: Stacy Mercado  Procedure(s) Performed: REVERSE SHOULDER ARTHROPLASTY (Right )  Patient Location: PACU  Anesthesia Type:General  Level of Consciousness: awake, alert , oriented and patient cooperative  Airway & Oxygen Therapy: Patient Spontanous Breathing and Patient connected to nasal cannula oxygen  Post-op Assessment: Report given to RN and Post -op Vital signs reviewed and stable  Post vital signs: Reviewed and stable  Last Vitals:  Vitals:   11/20/17 0726 11/20/17 0731  BP: (!) 152/74 (!) 150/71  Pulse: 68 65  Resp: 10 11  Temp:    SpO2: 100% 100%    Last Pain:  Vitals:   11/20/17 0721  TempSrc:   PainSc: 0-No pain         Complications: No apparent anesthesia complications

## 2017-11-20 NOTE — Anesthesia Procedure Notes (Signed)
Procedure Name: Intubation Date/Time: 11/20/2017 7:45 AM Performed by: Bernardo Heater, CRNA Pre-anesthesia Checklist: Patient identified, Emergency Drugs available, Suction available and Patient being monitored Patient Re-evaluated:Patient Re-evaluated prior to induction Oxygen Delivery Method: Circle system utilized Preoxygenation: Pre-oxygenation with 100% oxygen Induction Type: IV induction Laryngoscope Size: Mac and 3 Grade View: Grade I Tube size: 7.0 mm Number of attempts: 1 Placement Confirmation: ETT inserted through vocal cords under direct vision,  positive ETCO2 and breath sounds checked- equal and bilateral Secured at: 21 cm Tube secured with: Tape Dental Injury: Teeth and Oropharynx as per pre-operative assessment

## 2017-11-21 LAB — CBC WITH DIFFERENTIAL/PLATELET
BASOS PCT: 0 %
Basophils Absolute: 0 10*3/uL (ref 0–0.1)
EOS ABS: 0 10*3/uL (ref 0–0.7)
EOS PCT: 0 %
HCT: 32.8 % — ABNORMAL LOW (ref 35.0–47.0)
Hemoglobin: 10.8 g/dL — ABNORMAL LOW (ref 12.0–16.0)
Lymphocytes Relative: 13 %
Lymphs Abs: 1.4 10*3/uL (ref 1.0–3.6)
MCH: 29.9 pg (ref 26.0–34.0)
MCHC: 32.8 g/dL (ref 32.0–36.0)
MCV: 91.2 fL (ref 80.0–100.0)
MONO ABS: 1.5 10*3/uL — AB (ref 0.2–0.9)
MONOS PCT: 14 %
Neutro Abs: 7.7 10*3/uL — ABNORMAL HIGH (ref 1.4–6.5)
Neutrophils Relative %: 73 %
PLATELETS: 202 10*3/uL (ref 150–440)
RBC: 3.59 MIL/uL — ABNORMAL LOW (ref 3.80–5.20)
RDW: 13.4 % (ref 11.5–14.5)
WBC: 10.6 10*3/uL (ref 3.6–11.0)

## 2017-11-21 LAB — GLUCOSE, CAPILLARY
GLUCOSE-CAPILLARY: 107 mg/dL — AB (ref 65–99)
GLUCOSE-CAPILLARY: 100 mg/dL — AB (ref 65–99)

## 2017-11-21 LAB — BASIC METABOLIC PANEL
Anion gap: 6 (ref 5–15)
BUN: 14 mg/dL (ref 6–20)
CALCIUM: 9.8 mg/dL (ref 8.9–10.3)
CO2: 25 mmol/L (ref 22–32)
CREATININE: 0.68 mg/dL (ref 0.44–1.00)
Chloride: 110 mmol/L (ref 101–111)
GFR calc Af Amer: >60 mL/min (ref >60)
GLUCOSE: 114 mg/dL — AB (ref 65–99)
Potassium: 5 mmol/L (ref 3.5–5.1)
Sodium: 141 mmol/L (ref 135–145)

## 2017-11-21 MED ORDER — ASPIRIN 325 MG PO TBEC: 325.0000 mg | DELAYED_RELEASE_TABLET | Freq: Every day | ORAL | 0 refills | Status: DC

## 2017-11-21 MED ORDER — OXYCODONE HCL 5 MG PO TABS: 5.0000 mg | ORAL_TABLET | ORAL | 0 refills | Status: DC | PRN

## 2017-11-21 NOTE — Care Management Note (Signed)
Case Management Note  Patient Details  Name: Stacy Mercado MRN: [id number] Date of Birth: 03-Nov-1946  Subjective/Objective:  POD # 1 reverse right shoulder arthroplasty. Met with               patient.She lives at home with her bother. She will need no DME. Offered home health agencies. Referral to [facility] [place] [facility] [place]. It is anticipated that patient will discharge home today.    Action/Plan:   Expected Discharge Date:                  Expected Discharge Plan:  [place]  In-House Referral:     Discharge planning Services  CM Consult  Post Acute Care Choice:  Home Health Choice offered to:  Patient  DME Arranged:    DME Agency:     HH Arranged:  PT, OT HH Agency:  [facility]  Status of Service:  Completed, signed off  If discussed at [employer], dates discussed:    Additional Comments:  Jolly Mango, RN 11/21/2017, 9:54 AM

## 2017-11-21 NOTE — Progress Notes (Signed)
Physical Therapy Treatment Patient Details Name: Stacy Mercado MRN: [id number] DOB: 10/18/1946 Today's Date: 11/21/2017    History of Present Illness Pt is a 71 y.o. female s/p R reverse total shoulder arthroplasty secondary to massive irreparable rotator cuff tear 11/20/17.  PMH includes htn, chronic LBP, bladder CA, and DM.    PT Comments    Pt able to ambulate around nursing loop 2x's and navigate 8 stairs with railing and SBA safely.  Pain 1/10 R shoulder during session.  Overall tolerated session well and eager to discharge home today.  Plan to discharge home with ongoing therapy as arranged by surgeon.    Follow Up Recommendations  DC plan and follow up therapy as arranged by surgeon     Equipment Recommendations  None recommended by PT    Recommendations for Other Services OT consult     Precautions / Restrictions Precautions Precautions: Fall;Shoulder Shoulder Interventions: Shoulder sling/immobilizer;Shoulder abduction pillow Precaution Booklet Issued: Yes (comment) Restrictions Weight Bearing Restrictions: Yes RUE Weight Bearing: Non weight bearing    Mobility  Bed Mobility         General bed mobility comments: Deferred d/t pt sitting in chair beginning and end of session (pt reports plan to sleep in recliner at home because it is more comfortable)  Transfers Overall transfer level: Independent Equipment used: None Transfers: Sit to/from [employer] to Stand: Independent Stand pivot transfers: Independent       General transfer comment: steady and strong transfers  Ambulation/Gait Ambulation/Gait assistance: Supervision Ambulation Distance (Feet): 400 Feet Assistive device: None Gait Pattern/deviations: Step-through pattern Gait velocity: mildly decreased   General Gait Details: mild increased B lateral sway but steady without loss of balance   Stairs Stairs: Yes   Stair Management: Step to pattern;Forwards(L UE on  railing ascending/descending) Number of Stairs: 8 General stair comments: steady and strong navigating stairs  Wheelchair Mobility    Modified Rankin (Stroke Patients Only)       Balance Overall balance assessment: History of Falls;Needs assistance Sitting-balance support: No upper extremity supported;Feet supported Sitting balance-Leahy Scale: Good Sitting balance - Comments: sitting reaching within BOS with L UE steady   Standing balance support: No upper extremity supported Standing balance-Leahy Scale: Good Standing balance comment: standing reaching within BOS with L UE steady                            Cognition Arousal/Alertness: Awake/alert Behavior During Therapy: WFL for tasks assessed/performed Overall Cognitive Status: Within Functional Limits for tasks assessed                                        Exercises Pt educated in polar care management and post-op shoulder precautions.    General Comments General comments (skin integrity, edema, etc.): Pt resting in recliner with shoulder immobilizer with abduction pillow in place.  Nursing cleared pt for participation in physical therapy.  Pt agreeable to PT session.      Pertinent Vitals/Pain Pain Assessment: 0-10 Pain Score: 1  Pain Location: R shoulder Pain Descriptors / Indicators: Sore Pain Intervention(s): Limited activity within patient's tolerance;Monitored during session;Repositioned;Ice applied  Vitals (HR and O2 on room air) stable and WFL throughout treatment session.    Home Living Family/patient expects to be discharged to:: Private residence Living Arrangements: Other relatives(brother) Available Help at Discharge: Family;Available 24 hours/day  Type of Home: House Home Access: Stairs to enter Entrance Stairs-Rails: Right;Left Home Layout: One level Home Equipment: Hand held shower head      Prior Function Level of Independence: Independent      Comments: Pt  indep with ADL, IADL, driving. Pt enjoys gardening/yardwork and has h/o falls (most recent tripping on root outside on Nov 19th).    PT Goals (current goals can now be found in the care plan section) Acute Rehab PT Goals Patient Stated Goal: to go home PT Goal Formulation: With patient Time For Goal Achievement: 12/04/17 Potential to Achieve Goals: Good Progress towards PT goals: Progressing toward goals    Frequency    BID      PT Plan Current plan remains appropriate    Co-evaluation              AM-PAC PT "6 Clicks" Daily Activity  Outcome Measure  Difficulty turning over in bed (including adjusting bedclothes, sheets and blankets)?: A Little Difficulty moving from lying on back to sitting on the side of the bed? : A Little Difficulty sitting down on and standing up from a chair with arms (e.g., wheelchair, bedside commode, etc,.)?: None Help needed moving to and from a bed to chair (including a wheelchair)?: None Help needed walking in hospital room?: A Little Help needed climbing 3-5 steps with a railing? : A Little 6 Click Score: 20    End of Session Equipment Utilized During Treatment: Gait belt(R shoulder immobilizer with abduction pillow in place) Activity Tolerance: Patient tolerated treatment well Patient left: in chair;with call bell/phone within reach;with chair alarm set;with SCD's reapplied(Polar care in place and activated) Nurse Communication: Mobility status;Precautions;Weight bearing status;Other (comment)(NT notified of need for more ice in polar care) PT Visit Diagnosis: Unsteadiness on feet (R26.81);History of falling (Z91.81)     Time: 7412-8786 PT Time Calculation (min) (ACUTE ONLY): 23 min  Charges:  $Gait Training: 8-22 mins $Therapeutic Exercise: 8-22 mins                    G CodesLeitha Bleak, PT 11/21/17, 10:27 AM [phone]

## 2017-11-21 NOTE — Progress Notes (Signed)
  Subjective: 1 Day Post-Op Procedure(s) (LRB): REVERSE SHOULDER ARTHROPLASTY (Right) Patient reports pain as mild.   Patient is well, and has had no acute complaints or problems Plan is to go Home after hospital stay. Negative for chest pain and shortness of breath Fever: no Gastrointestinal:Negative for nausea and vomiting  Objective: Vital signs in last 24 hours: Temp:  [97.5 F (36.4 C)-98.2 F (36.8 C)] 97.7 F (36.5 C) (12/05 0745) Pulse Rate:  [61-74] 61 (12/05 0745) Resp:  [14-16] 16 (12/05 0745) BP: (102-134)/(45-59) 133/57 (12/05 0745) SpO2:  [92 %-100 %] 96 % (12/05 0745) Weight:  [77.3 kg (170 lb 8.1 oz)] 77.3 kg (170 lb 8.1 oz) (12/04 1858)  Intake/Output from previous day:  Intake/Output Summary (Last 24 hours) at 11/21/2017 1049 Last data filed at 11/20/2017 1608 Gross per 24 hour  Intake 1310 ml  Output 950 ml  Net 360 ml    Intake/Output this shift: No intake/output data recorded.  Labs: Recent Labs    11/21/17 0546  HGB 10.8*   Recent Labs    11/21/17 0546  WBC 10.6  RBC 3.59*  HCT 32.8*  PLT 202   Recent Labs    11/21/17 0546  NA 141  K 5.0  CL 110  CO2 25  BUN 14  CREATININE 0.68  GLUCOSE 114*  CALCIUM 9.8   No results for input(s): LABPT, INR in the last 72 hours.   EXAM General - Patient is Alert, Appropriate and Oriented Extremity - ABD soft Sensation intact distally Intact pulses distally Dorsiflexion/Plantar flexion intact Incision: dressing C/D/I No cellulitis present Dressing/Incision - clean, dry, no drainage Motor Function - intact, moving foot and toes well on exam. Pt is intact to light touch to the right arm.  Abdomen soft with normal BS.  Past Medical History:  Diagnosis Date  . Arthritis   . Bladder cancer ([place])   . Chronic low back pain   . Depression   . Diabetes mellitus without complication ([place])   . Hyperlipidemia 08/07/2016  . Hypertension   . PVD (peripheral vascular disease) ([place])   . Urge  incontinence of urine 08/07/2016    Assessment/Plan: 1 Day Post-Op Procedure(s) (LRB): REVERSE SHOULDER ARTHROPLASTY (Right) Active Problems:   Status post reverse total shoulder replacement, right  Estimated body mass index is 28.37 kg/m as calculated from the following:   Height as of this encounter: 5\' 5"  (1.651 m).   Weight as of this encounter: 77.3 kg (170 lb 8.1 oz). Advance diet Up with therapy D/C IV fluids when tolerating PO intake.  Labs reviewed. Did great with PT today, recommending HHPT. Plan will be for discharge home this afternoon.  DVT Prophylaxis - Lovenox, Foot Pumps and TED hose Non-weightbearing to the right arm.  Raquel Haidyn Chadderdon, PA-C [facility] Orthopaedic Surgery 11/21/2017, 10:49 AM

## 2017-11-21 NOTE — Progress Notes (Signed)
Patient is being discharged home. IV removed with cath intact. Script and last dose of med given. Extra dressing sent home with patient. Reviewed discharge instructions along with follow-up appointment. Allowed time for questions. Immobilizer in place.

## 2017-11-21 NOTE — Anesthesia Postprocedure Evaluation (Signed)
Anesthesia Post Note  Patient: Stacy Mercado  Procedure(s) Performed: REVERSE SHOULDER ARTHROPLASTY (Right )  Patient location during evaluation: PACU Anesthesia Type: General Level of consciousness: awake and alert Pain management: pain level controlled Vital Signs Assessment: post-procedure vital signs reviewed and stable Respiratory status: spontaneous breathing, nonlabored ventilation, respiratory function stable and patient connected to nasal cannula oxygen Cardiovascular status: blood pressure returned to baseline and stable Postop Assessment: no apparent nausea or vomiting Anesthetic complications: no     Last Vitals:  Vitals:   11/21/17 0416 11/21/17 0745  BP: (!) 134/52 (!) 133/57  Pulse: 62 61  Resp:  16  Temp: 36.4 C 36.5 C  SpO2: 95% 96%    Last Pain:  Vitals:   11/21/17 0745  TempSrc: Oral  PainSc: 0-No pain                 Martha Clan

## 2017-11-21 NOTE — Discharge Summary (Signed)
Physician Discharge Summary  Patient ID: Stacy Mercado MRN: [id number] DOB/AGE: [birth year] 71 y.o.  Admit date: 11/20/2017 Discharge date: 11/21/2017  Admission Diagnoses:  COMPLETE TEAR OF RIGHT ROTATOR CUFF,ROTATOR CUFF TENDINITIS  Discharge Diagnoses: Patient Active Problem List   Diagnosis Date Noted  . 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 Advance directive discussed with patient 03/13/2017  . Cataracts, bilateral 08/14/2016  . Calcific tendonitis of right shoulder 08/14/2016  . Right kidney mass 08/07/2016  . Hyperlipidemia 08/07/2016  . Urge incontinence of urine 08/07/2016  . 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 Bladder cancer ([place])   . Controlled type 2 diabetes mellitus ([place])   . Depression   . Benign hypertensive renal disease   . 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  Past Medical History:  Diagnosis Date  . Arthritis   . Bladder cancer ([place])   . Chronic low back pain   . Depression   . Diabetes mellitus without complication ([place])   . Hyperlipidemia 08/07/2016  . Hypertension   . PVD (peripheral vascular disease) ([place])   . Urge incontinence of urine 08/07/2016     Transfusion: None.   Consultants (if any):   Discharged Condition: Improved  Hospital Course: Stacy Mercado is an 71 y.o. female who was admitted 11/20/2017 with a diagnosis of a massive irreparable rotator cuff tear of the right shoulder and went to the operating room on 11/20/2017 and underwent the above named procedures.    Surgeries: Procedure(s): REVERSE SHOULDER ARTHROPLASTY on 11/20/2017 Patient tolerated the surgery well. Taken to PACU where she was stabilized and then transferred to the orthopedic floor.  Started on Lovenox 40mg  q 24 hrs. Foot pumps applied bilaterally at 80 mm. Heels elevated on bed with rolled towels. No evidence of DVT. Negative [place]. Physical therapy started  on day #1 for gait training and transfer. OT started day #1 for ADL and assisted devices.  Patient's IV was removed on POD1.  Implants: All press-fit [employer] Comprehensive system with a #13 micro-humeral stem, a 44 mm humeral tray with a standard insert, and a mini-base plate with a 36 mm glenosphere.  She was given perioperative antibiotics:  Anti-infectives (From admission, onward)   Start     Dose/Rate Route Frequency Ordered Stop   11/20/17 1400  ceFAZolin (ANCEF) 2 g in dextrose 5 % 100 mL IVPB     2 g 200 mL/hr over 30 Minutes Intravenous Every 6 hours 11/20/17 1103 11/21/17 0158   11/20/17 1115  ceFAZolin (ANCEF) IVPB 2g/100 mL premix  Status:  Discontinued     2 g 200 mL/hr over 30 Minutes Intravenous Every 6 hours 11/20/17 1100 11/20/17 1103   11/20/17 0554  ceFAZolin (ANCEF) 2-4 GM/100ML-% IVPB    Comments:  Sharpe, Eve   : cabinet override      11/20/17 0554 11/20/17 0801   11/20/17 0000  ceFAZolin (ANCEF) IVPB 2g/100 mL premix     2 g 200 mL/hr over 30 Minutes Intravenous  Once 11/19/17 2238 11/20/17 0816    .  She was given sequential compression devices, early ambulation, and Lovenox for DVT prophylaxis.  She benefited maximally from the hospital stay and there were no complications.    Recent vital signs:  Vitals:   11/21/17 0416 11/21/17 0745  BP: (!) 134/52 (!) 133/57  Pulse: 62 61  Resp:  16  Temp: 97.6 F (36.4 C) 97.7 F (36.5 C)  SpO2: 95% 96%    Recent laboratory studies:  Lab Results  Component Value Date   HGB 10.8 (L) 11/21/2017   HGB 12.9 11/05/2017   HGB 11.9 03/13/2017   Lab Results  Component Value Date   WBC 10.6 11/21/2017   PLT 202 11/21/2017   Lab Results  Component Value Date   INR 0.94 11/05/2017   Lab Results  Component Value Date   NA 141 11/21/2017   K 5.0 11/21/2017   CL 110 11/21/2017   CO2 25 11/21/2017   BUN 14 11/21/2017   CREATININE 0.68 11/21/2017   GLUCOSE 114 (H) 11/21/2017    Discharge Medications:    Allergies as of 11/21/2017      Reactions   Statins Other (See Comments)   myalgia      Medication List    TAKE these medications   acetaminophen 500 MG tablet Commonly known as:  TYLENOL Take 1,000 mg every 6 (six) hours as needed by mouth for moderate pain or headache.   aspirin 325 MG EC tablet Take 1 tablet (325 mg total) by mouth daily. What changed:    medication strength  how much to take   baclofen 10 MG tablet Commonly known as:  LIORESAL Take 1 tablet (10 mg total) by mouth 2 (two) times daily. What changed:    when to take this  reasons to take this   escitalopram 10 MG tablet Commonly known as:  LEXAPRO TAKE 1 TABLET(10 MG) BY MOUTH DAILY   gabapentin 300 MG capsule Commonly known as:  NEURONTIN TAKE 1 CAPSULE(300 MG) BY MOUTH AT BEDTIME   ICY HOT LIDOCAINE PLUS MENTHOL EX Apply 1 application topically 2 (two) times daily as needed (muscle pain).   losartan 50 MG tablet Commonly known as:  COZAAR TAKE 1 TABLET(50 MG) BY MOUTH DAILY   metFORMIN 1000 MG tablet Commonly known as:  GLUCOPHAGE TAKE 1 TABLET(1000 MG) BY MOUTH TWICE DAILY WITH A MEAL   mirabegron ER 50 MG Tb24 tablet Commonly known as:  [name] Take 1 tablet (50 mg total) by mouth daily.   multivitamin with minerals Tabs tablet Take 1 tablet by mouth daily.   oxyCODONE 5 MG immediate release tablet Commonly known as:  Oxy IR/ROXICODONE Take 1-2 tablets (5-10 mg total) by mouth every 4 (four) hours as needed for moderate pain or severe pain.   traMADol 50 MG tablet Commonly known as:  ULTRAM TAKE 1 TO 2 TABLETS BY MOUTH FOUR TIMES DAILY AS NEEDED FOR SEVERE PAIN What changed:  See the new instructions.            Durable Medical Equipment  (From admission, onward)        Start     Ordered   11/20/17 1101  DME 3 n 1  Once     11/20/17 1100      Diagnostic Studies: Dg Shoulder Right Port  Result Date: 11/20/2017 CLINICAL DATA:  Postop right shoulder. EXAM:  PORTABLE RIGHT SHOULDER COMPARISON:  MRI 10/04/2017. FINDINGS: Total right shoulder replacement. Hardware intact. Anatomic alignment. Degenerative changes right shoulder . No acute abnormality. IMPRESSION: Total right shoulder replacement.  Anatomic alignment. Electronically Signed   By: Economy   On: 11/20/2017 10:26   Disposition:  Plan for discharge home this afternoon.  Follow-up Information    Lattie Corns, PA-C Follow up in 14 day(s).   Specialty:  Physician Assistant Why:  Electa Sniff information: [address] [place] [address]  [phone]          Signed: Judson Roch PA-C 11/21/2017, 10:53 AM

## 2017-11-21 NOTE — Progress Notes (Signed)
Clinical Education officer, museum (CSW) received SNF consult. PT is recommending "DC plan and follow up therapy as arranged by surgeon." RN case manager aware of above. Please reconsult if future social work needs arise. CSW signing off.   [employer], LCSW [phone]

## 2017-11-21 NOTE — Evaluation (Signed)
Occupational Therapy Evaluation Patient Details Name: Stacy Mercado MRN: [id number] DOB: 1946/11/02 Today's Date: 11/21/2017    History of Present Illness Pt is a 71 y.o. female s/p R reverse total shoulder arthroplasty secondary to massive irreparable rotator cuff tear 11/20/17.  PMH includes htn, chronic LBP, bladder CA, and DM.   Clinical Impression   Patient was evaluated this date by OT. Pt lives with her brother and was independent prior to surgery. Patient was independent prior to admission including driving and was retired, enjoying Haematologist and gardening. Pt endorses approx 4 falls in the past year, primarily outside tripping over roots in the yard. Patient has brother to assist at home. Patient has orders for RUE immobilized and will be NWBing per MD. Patient presents with decreased strength/ROM/UE functional use with decreased ability to perform self care tasks. Pt educated in shoulder discharge precautions as well as handout with instructions for exercises, bathing, dressing, grooming, sling wear/schedule, and positioning for sleep, as well as education in polar care mgt. Pt verbalized understanding of all education/training provided. Recommend pt participate in follow up therapy as arranged by the surgeon. Will sign off.    Follow Up Recommendations  DC plan and follow up therapy as arranged by surgeon    Equipment Recommendations  None recommended by OT    Recommendations for Other Services       Precautions / Restrictions Precautions Precautions: Fall;Shoulder Shoulder Interventions: Shoulder sling/immobilizer;Shoulder abduction pillow Precaution Booklet Issued: Yes (comment) Restrictions Weight Bearing Restrictions: Yes RUE Weight Bearing: Non weight bearing      Mobility Bed Mobility Overal bed mobility: Needs Assistance Bed Mobility: Supine to Sit     Supine to sit: Supervision;HOB elevated        Transfers Overall transfer level: Needs  assistance Equipment used: None Transfers: Sit to/from Stand Sit to Stand: Supervision              Balance Overall balance assessment: History of Falls;Needs assistance Sitting-balance support: No upper extremity supported;Feet supported Sitting balance-Leahy Scale: Good     Standing balance support: No upper extremity supported Standing balance-Leahy Scale: Good                             ADL either performed or assessed with clinical judgement   ADL Overall ADL's : Needs assistance/impaired Eating/Feeding: Independent   Grooming: Modified independent   Upper Body Bathing: Minimal assistance Upper Body Bathing Details (indicate cue type and reason): pt educated in strategies for bathing while maintaining shoulder precautions Lower Body Bathing: Modified independent   Upper Body Dressing : Minimal assistance Upper Body Dressing Details (indicate cue type and reason): pt educated in 1 handed techniques Lower Body Dressing: Modified independent   Toilet Transfer: Supervision/safety   Toileting- Clothing Manipulation and Hygiene: Independent       Functional mobility during ADLs: Supervision/safety       Vision Baseline Vision/History: Wears glasses Wears Glasses: Reading only Patient Visual Report: No change from baseline Vision Assessment?: No apparent visual deficits     Perception     Praxis      Pertinent Vitals/Pain Pain Assessment: 0-10 Pain Score: 1      Hand Dominance     Extremity/Trunk Assessment Upper Extremity Assessment Upper Extremity Assessment: RUE deficits/detail RUE Deficits / Details: good grip strength, wrist/hand ROM WFL RUE: Unable to fully assess due to immobilization   Lower Extremity Assessment Lower Extremity Assessment: Overall WFL for tasks assessed  Cervical / Trunk Assessment Cervical / Trunk Assessment: Normal   Communication Communication Communication: No difficulties   Cognition  Arousal/Alertness: Awake/alert Behavior During Therapy: WFL for tasks assessed/performed Overall Cognitive Status: Within Functional Limits for tasks assessed                                     General Comments       Exercises Other Exercises Other Exercises: Pt educated in shoulder discharge handout with instructions for exercises, bathing, dressing, grooming, sling wear/schedule, and positioning for sleep, as well as education in polar care mgt.    Shoulder Instructions      Home Living Family/patient expects to be discharged to:: Private residence Living Arrangements: Other relatives(brother) Available Help at Discharge: Family;Available 24 hours/day Type of Home: House Home Access: Stairs to enter [employer] of Steps: 6 Entrance Stairs-Rails: Right;Left Home Layout: One level     Bathroom Shower/Tub: Teacher, early years/pre: Standard     Home Equipment: Hand held shower head          Prior Functioning/Environment Level of Independence: Independent        Comments: Pt indep with ADL, IADL, driving. Pt enjoys gardening/yardwork and has h/o falls (most recent tripping on root outside on Nov 19th).         OT Problem List:        OT Treatment/Interventions:      OT Goals(Current goals can be found in the care plan section) Acute Rehab OT Goals Patient Stated Goal: to go home OT Goal Formulation: All assessment and education complete, DC therapy  OT Frequency:     Barriers to D/C:            Co-evaluation              AM-PAC PT "6 Clicks" Daily Activity     Outcome Measure Help from another person eating meals?: None Help from another person taking care of personal grooming?: None Help from another person toileting, which includes using toliet, bedpan, or urinal?: None Help from another person bathing (including washing, rinsing, drying)?: A Little Help from another person to put on and taking off regular  upper body clothing?: A Little Help from another person to put on and taking off regular lower body clothing?: A Little 6 Click Score: 21   End of Session Nurse Communication: Other (comment)(ice for polar care)  Activity Tolerance: Patient tolerated treatment well Patient left: in chair;with call bell/phone within reach;with chair alarm set  OT Visit Diagnosis: Other abnormalities of gait and mobility (R26.89);History of falling (Z91.81)                Time: 1540-0867 OT Time Calculation (min): 29 min Charges:  OT General Charges $OT Visit: 1 Visit OT Evaluation $OT Eval Low Complexity: 1 Low OT Treatments $Self Care/Home Management : 8-22 mins G-Codes: OT G-codes **NOT FOR INPATIENT CLASS** Functional Assessment Tool Used: AM-PAC 6 Clicks Daily Activity;Clinical judgement Functional Limitation: Self care Self Care Current Status (Y1950): At least 1 percent but less than 20 percent impaired, limited or restricted Self Care Goal Status (D3267): At least 1 percent but less than 20 percent impaired, limited or restricted Self Care Discharge Status [phone]): At least 1 percent but less than 20 percent impaired, limited or restricted   Jeni Salles, MPH, MS, OTR/L ascom [phone] 11/21/17, 10:05 AM

## 2017-11-21 NOTE — Discharge Instructions (Signed)
Diet: As you were doing prior to hospitalization   Shower:  May shower but keep the wounds dry, use an occlusive plastic wrap, NO SOAKING IN TUB.  If the bandage gets wet, change with a clean dry gauze.  Dressing:  You may change your dressing as needed. Change the dressing with sterile gauze dressing.    Activity:  Increase activity slowly as tolerated, but follow the weight bearing instructions below.  No lifting or driving for 6 weeks.  Weight Bearing:   Non-weightbearing to the right arm.  Blood Clot Prevention: Take 325mg  aspirin once daily  To prevent constipation: you may use a stool softener such as -  Colace (over the counter) 100 mg by mouth twice a day  Drink plenty of fluids (prune juice may be helpful) and high fiber foods Miralax (over the counter) for constipation as needed.    Itching:  If you experience itching with your medications, try taking only a single pain pill, or even half a pain pill at a time.  You may take up to 10 pain pills per day, and you can also use benadryl over the counter for itching or also to help with sleep.   Precautions:  If you experience chest pain or shortness of breath - call 911 immediately for transfer to the hospital emergency department!!  If you develop a fever greater that 101 F, purulent drainage from wound, increased redness or drainage from wound, or calf pain-Call [place]                                              Follow- Up Appointment:  Please call for an appointment to be seen in 2 weeks at [facility]

## 2017-11-23 LAB — SURGICAL PATHOLOGY

## 2017-11-29 DIAGNOSIS — M1990 Unspecified osteoarthritis, unspecified site: Secondary | ICD-10-CM | POA: Diagnosis not present

## 2017-11-29 DIAGNOSIS — E1122 Type 2 diabetes mellitus with diabetic chronic kidney disease: Secondary | ICD-10-CM | POA: Diagnosis not present

## 2017-11-29 DIAGNOSIS — M47812 Spondylosis without myelopathy or radiculopathy, cervical region: Secondary | ICD-10-CM | POA: Diagnosis not present

## 2017-11-29 DIAGNOSIS — Z471 Aftercare following joint replacement surgery: Secondary | ICD-10-CM | POA: Diagnosis not present

## 2017-11-29 DIAGNOSIS — I129 Hypertensive chronic kidney disease with stage 1 through stage 4 chronic kidney disease, or unspecified chronic kidney disease: Secondary | ICD-10-CM | POA: Diagnosis not present

## 2017-11-29 DIAGNOSIS — M545 Low back pain: Secondary | ICD-10-CM | POA: Diagnosis not present

## 2017-12-01 DIAGNOSIS — M1990 Unspecified osteoarthritis, unspecified site: Secondary | ICD-10-CM | POA: Diagnosis not present

## 2017-12-01 DIAGNOSIS — M47812 Spondylosis without myelopathy or radiculopathy, cervical region: Secondary | ICD-10-CM | POA: Diagnosis not present

## 2017-12-01 DIAGNOSIS — I129 Hypertensive chronic kidney disease with stage 1 through stage 4 chronic kidney disease, or unspecified chronic kidney disease: Secondary | ICD-10-CM | POA: Diagnosis not present

## 2017-12-01 DIAGNOSIS — M545 Low back pain: Secondary | ICD-10-CM | POA: Diagnosis not present

## 2017-12-01 DIAGNOSIS — E1122 Type 2 diabetes mellitus with diabetic chronic kidney disease: Secondary | ICD-10-CM | POA: Diagnosis not present

## 2017-12-01 DIAGNOSIS — Z471 Aftercare following joint replacement surgery: Secondary | ICD-10-CM | POA: Diagnosis not present

## 2017-12-03 DIAGNOSIS — Z471 Aftercare following joint replacement surgery: Secondary | ICD-10-CM | POA: Diagnosis not present

## 2017-12-03 DIAGNOSIS — M47812 Spondylosis without myelopathy or radiculopathy, cervical region: Secondary | ICD-10-CM | POA: Diagnosis not present

## 2017-12-03 DIAGNOSIS — M545 Low back pain: Secondary | ICD-10-CM | POA: Diagnosis not present

## 2017-12-03 DIAGNOSIS — E1122 Type 2 diabetes mellitus with diabetic chronic kidney disease: Secondary | ICD-10-CM | POA: Diagnosis not present

## 2017-12-03 DIAGNOSIS — M1990 Unspecified osteoarthritis, unspecified site: Secondary | ICD-10-CM | POA: Diagnosis not present

## 2017-12-03 DIAGNOSIS — I129 Hypertensive chronic kidney disease with stage 1 through stage 4 chronic kidney disease, or unspecified chronic kidney disease: Secondary | ICD-10-CM | POA: Diagnosis not present

## 2017-12-04 DIAGNOSIS — E1122 Type 2 diabetes mellitus with diabetic chronic kidney disease: Secondary | ICD-10-CM | POA: Diagnosis not present

## 2017-12-04 DIAGNOSIS — M47812 Spondylosis without myelopathy or radiculopathy, cervical region: Secondary | ICD-10-CM | POA: Diagnosis not present

## 2017-12-04 DIAGNOSIS — Z471 Aftercare following joint replacement surgery: Secondary | ICD-10-CM | POA: Diagnosis not present

## 2017-12-04 DIAGNOSIS — I129 Hypertensive chronic kidney disease with stage 1 through stage 4 chronic kidney disease, or unspecified chronic kidney disease: Secondary | ICD-10-CM | POA: Diagnosis not present

## 2017-12-04 DIAGNOSIS — M1990 Unspecified osteoarthritis, unspecified site: Secondary | ICD-10-CM | POA: Diagnosis not present

## 2017-12-04 DIAGNOSIS — M545 Low back pain: Secondary | ICD-10-CM | POA: Diagnosis not present

## 2017-12-05 DIAGNOSIS — Z9889 Other specified postprocedural states: Secondary | ICD-10-CM | POA: Diagnosis not present

## 2017-12-05 DIAGNOSIS — M25511 Pain in right shoulder: Secondary | ICD-10-CM | POA: Diagnosis not present

## 2017-12-05 DIAGNOSIS — M6281 Muscle weakness (generalized): Secondary | ICD-10-CM | POA: Diagnosis not present

## 2017-12-05 DIAGNOSIS — M25611 Stiffness of right shoulder, not elsewhere classified: Secondary | ICD-10-CM | POA: Diagnosis not present

## 2017-12-12 ENCOUNTER — Other Ambulatory Visit: Payer: Self-pay | Admitting: Family Medicine

## 2017-12-12 NOTE — Telephone Encounter (Signed)
Last refill: 03/10/17 90# 1 refill (according to this she would have run out in September) Called pt and she confirmed that hse takes it every night. Do not see an active order.  Last OV: 03/13/17  Routing back to provider to reorder or refuse

## 2017-12-13 DIAGNOSIS — M6281 Muscle weakness (generalized): Secondary | ICD-10-CM | POA: Diagnosis not present

## 2017-12-13 NOTE — Telephone Encounter (Signed)
Routing to provider, see message below from RN at the [facility].

## 2017-12-20 DIAGNOSIS — M6281 Muscle weakness (generalized): Secondary | ICD-10-CM | POA: Diagnosis not present

## 2017-12-31 DIAGNOSIS — M1711 Unilateral primary osteoarthritis, right knee: Secondary | ICD-10-CM | POA: Diagnosis not present

## 2017-12-31 DIAGNOSIS — M1120 Other chondrocalcinosis, unspecified site: Secondary | ICD-10-CM | POA: Diagnosis not present

## 2017-12-31 DIAGNOSIS — M6281 Muscle weakness (generalized): Secondary | ICD-10-CM | POA: Diagnosis not present

## 2017-12-31 DIAGNOSIS — S83271D Complex tear of lateral meniscus, current injury, right knee, subsequent encounter: Secondary | ICD-10-CM | POA: Diagnosis not present

## 2017-12-31 DIAGNOSIS — Z96611 Presence of right artificial shoulder joint: Secondary | ICD-10-CM | POA: Diagnosis not present

## 2018-01-08 DIAGNOSIS — M6281 Muscle weakness (generalized): Secondary | ICD-10-CM | POA: Diagnosis not present

## 2018-01-10 ENCOUNTER — Telehealth: Payer: Self-pay | Admitting: Urology

## 2018-01-10 DIAGNOSIS — M25511 Pain in right shoulder: Secondary | ICD-10-CM | POA: Diagnosis not present

## 2018-01-10 NOTE — Telephone Encounter (Signed)
Patient is asking for more samples of mybretriq 50mg  please call her back @ [phone]  [facility]

## 2018-01-11 NOTE — Telephone Encounter (Signed)
LMOM- samples left up front.

## 2018-01-12 ENCOUNTER — Other Ambulatory Visit: Payer: Self-pay | Admitting: Family Medicine

## 2018-01-14 ENCOUNTER — Other Ambulatory Visit: Payer: Self-pay | Admitting: *Deleted

## 2018-01-14 ENCOUNTER — Inpatient Hospital Stay
Admission: RE | Admit: 2018-01-14 | Discharge: 2018-01-14 | Disposition: A | Discharge status: Home / Self Care | Payer: Self-pay | Source: Ambulatory Visit | Attending: *Deleted | Admitting: *Deleted

## 2018-01-14 DIAGNOSIS — Z9289 Personal history of other medical treatment: Secondary | ICD-10-CM

## 2018-01-15 DIAGNOSIS — M6281 Muscle weakness (generalized): Secondary | ICD-10-CM | POA: Diagnosis not present

## 2018-01-17 DIAGNOSIS — M6281 Muscle weakness (generalized): Secondary | ICD-10-CM | POA: Diagnosis not present

## 2018-01-22 DIAGNOSIS — M6281 Muscle weakness (generalized): Secondary | ICD-10-CM | POA: Diagnosis not present

## 2018-01-24 DIAGNOSIS — M6281 Muscle weakness (generalized): Secondary | ICD-10-CM | POA: Diagnosis not present

## 2018-02-01 ENCOUNTER — Other Ambulatory Visit: Payer: Self-pay | Admitting: Family Medicine

## 2018-02-01 ENCOUNTER — Ambulatory Visit
Admission: RE | Admit: 2018-02-01 | Discharge: 2018-02-01 | Disposition: A | Discharge status: Home / Self Care | Payer: Medicare Other | Source: Ambulatory Visit | Attending: Urology | Admitting: Urology

## 2018-02-01 DIAGNOSIS — N2889 Other specified disorders of kidney and ureter: Secondary | ICD-10-CM

## 2018-02-05 DIAGNOSIS — M6281 Muscle weakness (generalized): Secondary | ICD-10-CM | POA: Diagnosis not present

## 2018-02-06 ENCOUNTER — Other Ambulatory Visit: Payer: Medicare Other | Admitting: Urology

## 2018-02-07 DIAGNOSIS — M6281 Muscle weakness (generalized): Secondary | ICD-10-CM | POA: Diagnosis not present

## 2018-02-12 DIAGNOSIS — M6281 Muscle weakness (generalized): Secondary | ICD-10-CM | POA: Diagnosis not present

## 2018-02-14 DIAGNOSIS — M6281 Muscle weakness (generalized): Secondary | ICD-10-CM | POA: Diagnosis not present

## 2018-02-18 DIAGNOSIS — M6281 Muscle weakness (generalized): Secondary | ICD-10-CM | POA: Diagnosis not present

## 2018-02-19 ENCOUNTER — Ambulatory Visit (INDEPENDENT_AMBULATORY_CARE_PROVIDER_SITE_OTHER): Payer: Medicare Other | Admitting: Urology

## 2018-02-19 ENCOUNTER — Encounter: Payer: Self-pay | Admitting: Urology

## 2018-02-19 VITALS — BP 144/73 | HR 72 | Resp 16 | Ht 65.5 in | Wt 169.0 lb

## 2018-02-19 DIAGNOSIS — Z8551 Personal history of malignant neoplasm of bladder: Secondary | ICD-10-CM | POA: Diagnosis not present

## 2018-02-19 DIAGNOSIS — N2889 Other specified disorders of kidney and ureter: Secondary | ICD-10-CM

## 2018-02-19 DIAGNOSIS — C679 Malignant neoplasm of bladder, unspecified: Secondary | ICD-10-CM

## 2018-02-19 DIAGNOSIS — N3281 Overactive bladder: Secondary | ICD-10-CM

## 2018-02-19 LAB — URINALYSIS, COMPLETE
Bilirubin, UA: NEGATIVE
Glucose, UA: NEGATIVE
Leukocytes, UA: NEGATIVE
NITRITE UA: NEGATIVE
PH UA: 5.5 (ref 5.0–7.5)
Protein, UA: NEGATIVE
RBC UA: NEGATIVE
SPEC GRAV UA: 1.025 (ref 1.005–1.030)
UUROB: 0.2 mg/dL (ref 0.2–1.0)

## 2018-02-19 LAB — MICROSCOPIC EXAMINATION
BACTERIA UA: NONE SEEN
RBC, UA: NONE SEEN /hpf (ref 0–?)
WBC UA: NONE SEEN /HPF (ref 0–?)

## 2018-02-19 MED ORDER — CIPROFLOXACIN HCL 500 MG PO TABS
500.0000 mg | ORAL_TABLET | Freq: Once | ORAL | Status: AC
  Administered 2018-02-19: 500 mg via ORAL

## 2018-02-19 MED ORDER — LIDOCAINE HCL 2 % EX GEL
1.0000 "application " | Freq: Once | CUTANEOUS | Status: AC
  Administered 2018-02-19: 1 via URETHRAL
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
## 2018-02-19 NOTE — Addendum Note (Signed)
Addended by: Tommy Rainwater on: 02/19/2018 04:36 PM   Modules accepted: Orders

## 2018-03-14 ENCOUNTER — Ambulatory Visit: Payer: Medicare Other

## 2018-03-17 NOTE — Progress Notes (Signed)
BP 112/70 (BP Location: Left Arm, Patient Position: Sitting, Cuff Size: Normal)   Pulse 60   Temp 97.7 F (36.5 C)   Ht 5\' 4"  (1.626 m)   Wt 169 lb 2 oz (76.7 kg)   SpO2 98%   BMI 29.03 kg/m    Subjective:    Patient ID: Stacy Mercado, female    DOB: 22-Jul-1946, 72 y.o.   MRN: [id number]  HPI: Stacy Mercado is a 72 y.o. female  Chief Complaint  Patient presents with  . Hypertension  . Hyperlipidemia  . Diabetes  . Depression  . Pain    Refill on Tramadol   HYPERTENSION / HYPERLIPIDEMIA Satisfied with current treatment? yes Duration of hypertension: chronic BP monitoring frequency: not checking BP medication side effects: yes Past BP meds: losartan Duration of hyperlipidemia: chronic Cholesterol medication side effects: Not on anything Cholesterol supplements: none Past cholesterol medications: none Medication compliance: excellent compliance Aspirin: yes Recent stressors: no Recurrent headaches: yes Visual changes: no Palpitations: no Dyspnea: no Chest pain: no Lower extremity edema: no Dizzy/lightheaded: yes- being in the heat  DIABETES Hypoglycemic episodes:no Polydipsia/polyuria: no Visual disturbance: no Chest pain: no Paresthesias: no Glucose Monitoring: no Taking Insulin?: no Blood Pressure Monitoring: not checking Retinal Examination: Not up to Date Foot Exam: Done today Diabetic Education: Completed Pneumovax: Up to Date Influenza: Up to Date Aspirin: yes  DEPRESSION Mood status: controlled Satisfied with current treatment?: no Symptom severity: mild  Duration of current treatment : chronic Side effects: no Medication compliance: excellent compliance Psychotherapy/counseling: no  Previous psychiatric medications: lexapro Depressed mood: no Anxious mood: no Anhedonia: no Significant weight loss or gain: no Insomnia: stays up late, sleeps late, wakes up a bit more  Fatigue: no Feelings of worthlessness or guilt: no Impaired  concentration/indecisiveness: no Suicidal ideations: no Hopelessness: no Crying spells: no Depression screen [facility] 2/9 03/18/2018 03/13/2017 10/18/2016 08/28/2016 08/14/2016  Decreased Interest 0 0 0 0 0  Down, Depressed, Hopeless 0 1 0 0 0  PHQ - 2 Score 0 1 0 0 0  Altered sleeping 0 - - - -  Tired, decreased energy 0 - - - -  Change in appetite 0 - - - -  Feeling bad or failure about yourself  0 - - - -  Trouble concentrating 0 - - - -  Moving slowly or fidgety/restless 0 - - - -  Suicidal thoughts 0 - - - -  PHQ-9 Score 0 - - - -  Difficult doing work/chores Not difficult at all - - - -   Takes tramadol maybe about 1x a week, usually when she's pushing it and working in the garden. Takes it for joint pain. Has been taking glucosamine with good results.   Relevant past medical, surgical, family and social history reviewed and updated as indicated. Interim medical history since our last visit reviewed. Allergies and medications reviewed and updated.  Review of Systems  Constitutional: Negative.   Respiratory: Negative.   Cardiovascular: Negative.   Musculoskeletal: Positive for arthralgias and myalgias. Negative for back pain, gait problem, joint swelling, neck pain and neck stiffness.  Skin: Negative.   Neurological: Negative.   Psychiatric/Behavioral: Negative.     Per HPI unless specifically indicated above     Objective:    BP 112/70 (BP Location: Left Arm, Patient Position: Sitting, Cuff Size: Normal)   Pulse 60   Temp 97.7 F (36.5 C)   Ht 5\' 4"  (1.626 m)   Wt 169 lb 2 oz (  76.7 kg)   SpO2 98%   BMI 29.03 kg/m   Wt Readings from Last 3 Encounters:  03/18/18 169 lb 3.2 oz (76.7 kg)  03/18/18 169 lb 2 oz (76.7 kg)  02/19/18 169 lb (76.7 kg)    Physical Exam  Constitutional: She is oriented to person, place, and time. She appears well-developed and well-nourished. No distress.  HENT:  Head: Normocephalic and atraumatic.  Right Ear: Hearing normal.  Left Ear:  Hearing normal.  Nose: Nose normal.  Eyes: Pupils are equal, round, and reactive to light. Conjunctivae, EOM and lids are normal. Right eye exhibits no discharge. Left eye exhibits no discharge. No scleral icterus.  Cardiovascular: Normal rate, regular rhythm, normal heart sounds and intact distal pulses. Exam reveals no gallop and no friction rub.  No murmur heard. Pulmonary/Chest: Effort normal and breath sounds normal. No respiratory distress. She has no wheezes. She has no rales. She exhibits no tenderness.  Musculoskeletal: Normal range of motion.  Neurological: She is alert and oriented to person, place, and time.  Skin: Skin is warm, dry and intact. No rash noted. She is not diaphoretic. No erythema. No pallor.  Psychiatric: She has a normal mood and affect. Her speech is normal and behavior is normal. Judgment and thought content normal. Cognition and memory are normal.  Nursing note and vitals reviewed.   Results for orders placed or performed in visit on 02/19/18  Microscopic Examination  Result Value Ref Range   WBC, UA None seen 0 - 5 /hpf   RBC, UA None seen 0 - 2 /hpf   Epithelial Cells (non renal) 0-10 0 - 10 /hpf   Crystals Present (A) N/A   Crystal Type Calcium Oxalate N/A   Mucus, UA Present (A) Not Estab.   Bacteria, UA None seen None seen/Few  Urinalysis, Complete  Result Value Ref Range   Specific Gravity, UA 1.025 1.005 - 1.030   pH, UA 5.5 5.0 - 7.5   Color, UA Yellow Yellow   Appearance Ur Clear Clear   Leukocytes, UA Negative Negative   Protein, UA Negative Negative/Trace   Glucose, UA Negative Negative   Ketones, UA Trace (A) Negative   RBC, UA Negative Negative   Bilirubin, UA Negative Negative   Urobilinogen, Ur 0.2 0.2 - 1.0 mg/dL   Nitrite, UA Negative Negative   Microscopic Examination See below:       Assessment & Plan:   Problem List Items Addressed This Visit      Cardiovascular and Mediastinum   PVD (peripheral vascular disease) ([place])     Continue to follow with Dr. Lucky Cowboy. Call with any concerns. Checking lipids today. Continue to keep BP and cholesterol and sugars under good control.       Relevant Medications   losartan (COZAAR) 50 MG tablet   Other Relevant Orders   CBC with Differential/Platelet   Comprehensive metabolic panel   TSH     Endocrine   Controlled type 2 diabetes mellitus ([place]) - Primary    Under good control with A1c of 5.4. Will cut her metformin in 1/2 and recheck 6 months. Call with any concerns.       Relevant Medications   losartan (COZAAR) 50 MG tablet   metFORMIN (GLUCOPHAGE) 1000 MG tablet   Other Relevant Orders   Bayer DCA Hb A1c Waived   CBC with Differential/Platelet   Comprehensive metabolic panel   Microalbumin, Urine Waived   TSH     Genitourinary   Benign hypertensive renal  disease    Under good control. Continue current regimen. Continue current regimen. Continue to monitor. Call with any concerns. Refills given today.      Relevant Orders   CBC with Differential/Platelet   Comprehensive metabolic panel   Microalbumin, Urine Waived   TSH     Other   Depression    Under good control. Continue current regimen. Continue current regimen. Continue to monitor. Call with any concerns. Refills given today.      Relevant Medications   escitalopram (LEXAPRO) 10 MG tablet   Other Relevant Orders   CBC with Differential/Platelet   Comprehensive metabolic panel   TSH   Hyperlipidemia    Rechecking levels. Await results. Treat as needed. Call with any concerns.       Relevant Medications   losartan (COZAAR) 50 MG tablet   Other Relevant Orders   CBC with Differential/Platelet   Comprehensive metabolic panel   Lipid Panel w/o Chol/HDL Ratio   TSH   Urge incontinence of urine    Continue to follow with urology. Call with any concerns. Rechecking urine today.      Relevant Orders   CBC with Differential/Platelet   Comprehensive metabolic panel   TSH   UA/M w/rflx  Culture, Routine   Status post reverse total shoulder replacement, right    Still having pain in her shoulder. Will start naproxen and will give small supply of tramadol. Should last at least 3 months. Call with any concerns.           Follow up plan: Return in about 6 months (around 09/17/2018).

## 2018-03-18 ENCOUNTER — Ambulatory Visit (INDEPENDENT_AMBULATORY_CARE_PROVIDER_SITE_OTHER): Payer: Medicare Other | Admitting: Family Medicine

## 2018-03-18 ENCOUNTER — Encounter: Payer: Self-pay | Admitting: Family Medicine

## 2018-03-18 ENCOUNTER — Ambulatory Visit (INDEPENDENT_AMBULATORY_CARE_PROVIDER_SITE_OTHER): Payer: Medicare Other

## 2018-03-18 VITALS — BP 112/70 | HR 60 | Temp 97.7°F | Ht 64.0 in | Wt 169.1 lb

## 2018-03-18 VITALS — BP 112/70 | HR 60 | Temp 97.7°F | Resp 16 | Ht 64.0 in | Wt 169.2 lb

## 2018-03-18 DIAGNOSIS — I739 Peripheral vascular disease, unspecified: Secondary | ICD-10-CM | POA: Diagnosis not present

## 2018-03-18 DIAGNOSIS — F3341 Major depressive disorder, recurrent, in partial remission: Secondary | ICD-10-CM

## 2018-03-18 DIAGNOSIS — Z96611 Presence of right artificial shoulder joint: Secondary | ICD-10-CM

## 2018-03-18 DIAGNOSIS — I129 Hypertensive chronic kidney disease with stage 1 through stage 4 chronic kidney disease, or unspecified chronic kidney disease: Secondary | ICD-10-CM | POA: Diagnosis not present

## 2018-03-18 DIAGNOSIS — Z0000 Encounter for general adult medical examination without abnormal findings: Secondary | ICD-10-CM

## 2018-03-18 DIAGNOSIS — E782 Mixed hyperlipidemia: Secondary | ICD-10-CM | POA: Diagnosis not present

## 2018-03-18 DIAGNOSIS — E119 Type 2 diabetes mellitus without complications: Secondary | ICD-10-CM

## 2018-03-18 DIAGNOSIS — N3941 Urge incontinence: Secondary | ICD-10-CM | POA: Diagnosis not present

## 2018-03-18 LAB — MICROSCOPIC EXAMINATION
Bacteria, UA: NONE SEEN
RBC MICROSCOPIC, UA: NONE SEEN /HPF (ref 0–2)

## 2018-03-18 LAB — UA/M W/RFLX CULTURE, ROUTINE
BILIRUBIN UA: NEGATIVE
GLUCOSE, UA: NEGATIVE
KETONES UA: NEGATIVE
NITRITE UA: NEGATIVE
PROTEIN UA: NEGATIVE
RBC UA: NEGATIVE
Specific Gravity, UA: >1.03 — ABNORMAL HIGH (ref 1.005–1.030)
UUROB: 0.2 mg/dL (ref 0.2–1.0)
pH, UA: 5.5 (ref 5.0–7.5)

## 2018-03-18 LAB — MICROALBUMIN, URINE WAIVED
CREATININE, URINE WAIVED: 300 mg/dL (ref 10–300)
Microalb, Ur Waived: 10 mg/L (ref 0–19)

## 2018-03-18 LAB — BAYER DCA HB A1C WAIVED: HB A1C (BAYER DCA - WAIVED): 5.4 % (ref <7.0)

## 2018-03-18 MED ORDER — METFORMIN HCL 1000 MG PO TABS: 1000.0000 mg | ORAL_TABLET | Freq: Every day | ORAL | 1 refills | Status: DC

## 2018-03-18 MED ORDER — TRAMADOL HCL 50 MG PO TABS: ORAL_TABLET | ORAL | 0 refills | Status: DC

## 2018-03-18 MED ORDER — NAPROXEN 500 MG PO TABS: 500.0000 mg | ORAL_TABLET | Freq: Two times a day (BID) | ORAL | 1 refills | Status: DC

## 2018-03-18 MED ORDER — ESCITALOPRAM OXALATE 10 MG PO TABS: 10.0000 mg | ORAL_TABLET | Freq: Every day | ORAL | 1 refills | Status: DC

## 2018-03-18 MED ORDER — LOSARTAN POTASSIUM 50 MG PO TABS: 50.0000 mg | ORAL_TABLET | Freq: Every day | ORAL | 1 refills | Status: DC

## 2018-03-18 MED ORDER — METFORMIN HCL 1000 MG PO TABS: 500.0000 mg | ORAL_TABLET | Freq: Every day | ORAL | 1 refills | Status: DC

## 2018-03-18 MED ORDER — METFORMIN HCL 1000 MG PO TABS: ORAL_TABLET | ORAL | 1 refills | Status: DC

## 2018-03-18 MED ORDER — GABAPENTIN 300 MG PO CAPS: ORAL_CAPSULE | ORAL | 1 refills | Status: DC

## 2018-03-18 MED ORDER — CICLOPIROX 8 % EX SOLN: CUTANEOUS | 3 refills | Status: DC

## 2018-03-18 NOTE — Patient Instructions (Signed)
Please call and schedule your mammogram and bone density scan [facility] [place]: [address] [place], [place],  [address]  Phone: [phone]

## 2018-03-18 NOTE — Assessment & Plan Note (Signed)
Under good control. Continue current regimen. Continue current regimen. Continue to monitor. Call with any concerns. Refills given today.

## 2018-03-18 NOTE — Assessment & Plan Note (Signed)
Continue to follow with Dr. Lucky Cowboy. Call with any concerns. Checking lipids today. Continue to keep BP and cholesterol and sugars under good control.

## 2018-03-18 NOTE — Progress Notes (Signed)
Subjective:   Stacy Mercado is a 72 y.o. female who presents for Medicare Annual (Subsequent) preventive examination.  Review of Systems:   Cardiac Risk Factors include: hypertension;advanced age (>34men, >64 women);dyslipidemia;diabetes mellitus     Objective:     Vitals: BP 112/70 (BP Location: Left Arm, Patient Position: Sitting)   Pulse 60   Temp 97.7 F (36.5 C) (Oral)   Resp 16   Ht 5\' 4"  (1.626 m)   Wt 169 lb 3.2 oz (76.7 kg)   BMI 29.04 kg/m   Body mass index is 29.04 kg/m.  Advanced Directives 03/18/2018 11/20/2017 11/05/2017 03/13/2017 01/30/2017 01/23/2017 10/15/2016  Does Patient Have a Medical Advance Directive? No No No No No No No  Would patient like information on creating a medical advance directive? Yes (MAU/Ambulatory/Procedural Areas - Information given) No - Patient declined No - Patient declined Yes (MAU/Ambulatory/Procedural Areas - Information given) No - Patient declined No - Patient declined No - patient declined information    Tobacco Social History   Tobacco Use  Smoking Status Former Smoker  . Years: 50.00  . Types: Cigarettes  . Last attempt to quit: 12/18/2006  . Years since quitting: 11.2  Smokeless Tobacco Never Used     Counseling given: Not Answered   Clinical Intake:  Pre-visit preparation completed: Yes  Pain : No/denies pain     Nutritional Status: BMI 25 -29 Overweight Nutritional Risks: None Diabetes: Yes(a1c checked today ) CBG done?: No Did pt. bring in CBG monitor from home?: No  How often do you need to have someone help you when you read instructions, pamphlets, or other written materials from your doctor or pharmacy?: 1 - Never What is the last grade level you completed in school?: 12th grade  Interpreter Needed?: No  Information entered by :: Shajuana Mclucas,LPN   Past Medical History:  Diagnosis Date  . Arthritis   . Bladder cancer ([place])   . Chronic low back pain   . Depression   . Diabetes mellitus without  complication ([place])   . Hyperlipidemia 08/07/2016  . Hypertension   . PVD (peripheral vascular disease) ([place])   . Urge incontinence of urine 08/07/2016   Past Surgical History:  Procedure Laterality Date  . ABDOMINAL HYSTERECTOMY    . APPENDECTOMY  1961  . bladder cancer removed  2014  . CATARACT EXTRACTION W/ INTRAOCULAR LENS  IMPLANT, BILATERAL    . complete hysterectomy  1999  . DILATION AND CURETTAGE OF UTERUS  1999  . L rotator cuff Left 2010  . R thumb ruptured ligament and tendon  2012  . REVERSE SHOULDER ARTHROPLASTY Right 11/20/2017   Procedure: REVERSE SHOULDER ARTHROPLASTY;  Surgeon: Corky Mull, MD;  Location: [facility] ORS;  Service: Orthopedics;  Laterality: Right;  . SHOULDER ARTHROSCOPY WITH BICEPSTENOTOMY Right 01/30/2017   Procedure: SHOULDER ARTHROSCOPY WITH BICEPSTENOTOMY;  Surgeon: Corky Mull, MD;  Location: [facility] ORS;  Service: Orthopedics;  Laterality: Right;  biceps tenolysis  . SHOULDER ARTHROSCOPY WITH OPEN ROTATOR CUFF REPAIR Right 01/30/2017   Procedure: SHOULDER ARTHROSCOPY WITH OPEN ROTATOR CUFF REPAIR;  Surgeon: Corky Mull, MD;  Location: [facility] ORS;  Service: Orthopedics;  Laterality: Right;  Massive rotator cuff tear repair   Family History  Problem Relation Age of Onset  . Hypertension Brother   . Diabetes Brother   . Healthy Son   . Healthy Son   . Healthy Daughter   . Kidney cancer Neg Hx   . Bladder Cancer Neg Hx  Social History   Socioeconomic History  . Marital status: Divorced    Spouse name: Not on file  . Number of children: Not on file  . Years of education: Not on file  . Highest education level: Not on file  Occupational History  . Not on file  Social Needs  . Financial resource strain: Not hard at all  . Food insecurity:    Worry: Never true    Inability: Never true  . Transportation needs:    Medical: No    Non-medical: No  Tobacco Use  . Smoking status: Former Smoker    Years: 50.00    Types: Cigarettes    Last attempt  to quit: 12/18/2006    Years since quitting: 11.2  . Smokeless tobacco: Never Used  Substance and Sexual Activity  . Alcohol use: Yes    Alcohol/week: 4.2 oz    Types: 7 Shots of liquor per week    Comment: glass of liquor nightly  . Drug use: No  . Sexual activity: Not on file  Lifestyle  . Physical activity:    Days per week: 0 days    Minutes per session: 0 min  . Stress: Not at all  Relationships  . Social connections:    Talks on phone: More than three times a week    Gets together: Once a week    Attends religious service: Never    Active member of club or organization: No    Attends meetings of clubs or organizations: Never    Relationship status: Divorced  Other Topics Concern  . Not on file  Social History Narrative  . Not on file    Outpatient Encounter Medications as of 03/18/2018  Medication Sig  . acetaminophen (TYLENOL) 500 MG tablet Take 1,000 mg every 6 (six) hours as needed by mouth for moderate pain or headache.   Marland Kitchen aspirin EC 325 MG EC tablet Take 1 tablet (325 mg total) by mouth daily. (Patient taking differently: Take 81 mg by mouth daily. )  . baclofen (LIORESAL) 10 MG tablet Take 1 tablet (10 mg total) by mouth 2 (two) times daily. (Patient taking differently: Take 10 mg 2 (two) times daily as needed by mouth for muscle spasms. )  . Lidocaine-Menthol (ICY HOT LIDOCAINE PLUS MENTHOL EX) Apply 1 application topically 2 (two) times daily as needed (muscle pain).  . mirabegron ER ([name]) 50 MG TB24 tablet Take 1 tablet (50 mg total) by mouth daily.  . Multiple Vitamin (MULTIVITAMIN WITH MINERALS) TABS tablet Take 1 tablet by mouth daily.  . [DISCONTINUED] escitalopram (LEXAPRO) 10 MG tablet TAKE 1 TABLET(10 MG) BY MOUTH DAILY  . [DISCONTINUED] gabapentin (NEURONTIN) 300 MG capsule TAKE 1 CAPSULE(300 MG) BY MOUTH AT BEDTIME  . [DISCONTINUED] losartan (COZAAR) 50 MG tablet TAKE 1 TABLET(50 MG) BY MOUTH DAILY  . [DISCONTINUED] metFORMIN (GLUCOPHAGE) 1000 MG  tablet TAKE 1 TABLET(1000 MG) BY MOUTH TWICE DAILY WITH A MEAL  . [DISCONTINUED] traMADol (ULTRAM) 50 MG tablet TAKE 1 TO 2 TABLETS BY MOUTH FOUR TIMES DAILY AS NEEDED FOR SEVERE PAIN (Patient taking differently: TAKE 50 MG BY MOUTH FOUR TIMES DAILY AS NEEDED FOR SEVERE PAIN)   No facility-administered encounter medications on file as of 03/18/2018.     Activities of Daily Living In your present state of health, do you have any difficulty performing the following activities: 03/18/2018 03/18/2018  Hearing? N N  Vision? N N  Difficulty concentrating or making decisions? N N  Walking or climbing  stairs? N N  Dressing or bathing? N N  Doing errands, shopping? N N  Preparing Food and eating ? - N  Using the Toilet? - N  In the past six months, have you accidently leaked urine? - Y  Comment - wears protection   Do you have problems with loss of bowel control? - N  Managing your Medications? - N  Managing your Finances? - N  Housekeeping or managing your Housekeeping? - N  Some recent data might be hidden    Patient Care Team: Valerie Roys, DO as PCP - General (Family Medicine)    Assessment:   This is a routine wellness examination for T[place]ana.  Exercise Activities and Dietary recommendations Current Exercise Habits: The patient does not participate in regular exercise at present, Exercise limited by: None identified  Goals    . DIET - INCREASE WATER INTAKE     Recommend drinking at least 6-8 glasses of water a day        Fall Risk Fall Risk  03/18/2018 03/13/2017 08/14/2016  Falls in the past year? Yes Yes Yes  Number falls in past yr: 1 1 2  or more  Injury with Fall? Yes Yes Yes  Comment - Rt shoulder  -  Risk Factor Category  High Fall Risk - -  Follow up Falls prevention discussed - -   Is the patient's home free of loose throw rugs in walkways, pet beds, electrical cords, etc?   no      Grab bars in the bathroom? no      Handrails on the stairs?   yes      Adequate  lighting?   yes  Timed Get Up and Go performed: Completed in 8 seconds with no use of assistive devices, steady gait. No intervention needed at this time.   Depression Screen PHQ 2/9 Scores 03/18/2018 03/13/2017 10/18/2016 08/28/2016  PHQ - 2 Score 0 1 0 0  PHQ- 9 Score 0 - - -     Cognitive Function     6CIT Screen 03/18/2018 03/13/2017  What Year? 0 points 0 points  What month? 0 points 0 points  What time? 0 points 0 points  Count back from 20 0 points 0 points  Months in reverse 0 points 0 points  Repeat phrase 0 points 0 points  Total Score 0 0    Immunization History  Administered Date(s) Administered  . Influenza, High Dose Seasonal PF 08/28/2016, 09/20/2017  . Pneumococcal Conjugate-13 06/29/2015, 09/27/2016  . Pneumococcal Polysaccharide-23 08/06/2013  . Tdap 11/28/2016  . Zoster 08/06/2013    Qualifies for Shingles Vaccine? Yes, discussed shingrix vaccine   Screening Tests Health Maintenance  Topic Date Due  . DEXA SCAN  12/01/2009  . MAMMOGRAM  12/30/2017  . FOOT EXAM  03/13/2018  . OPHTHALMOLOGY EXAM  03/19/2019 (Originally 09/12/2017)  . INFLUENZA VACCINE  07/18/2018  . HEMOGLOBIN A1C  09/13/2018  . Fecal DNA (Cologuard)  05/24/2020  . TETANUS/TDAP  11/28/2026  . Hepatitis C Screening  Completed  . PNA vac Low Risk Adult  Completed    Cancer Screenings: Lung: Low Dose CT Chest recommended if Age 30-80 years, 30 pack-year currently smoking OR have quit w/in 15years. Patient does not qualify. Breast:  Up to date on Mammogram? No   Up to date of Bone Density/Dexa? No Colorectal: cologuard completed 05/24/2017  Additional Screenings:  Hepatitis B/HIV/Syphillis: not indicated  Hepatitis C Screening: completed 08/14/2016     Plan:    I  have personally reviewed and addressed the Medicare Annual Wellness questionnaire and have noted the following in the patient's chart:  A. Medical and social history B. Use of alcohol, tobacco or illicit drugs  C. Current  medications and supplements D. Functional ability and status E.  Nutritional status F.  Physical activity G. Advance directives H. List of other physicians I.  Hospitalizations, surgeries, and ER visits in previous 12 months J.  [place] such as hearing and vision if needed, cognitive and depression L. Referrals and appointments   In addition, I have reviewed and discussed with patient certain preventive protocols, quality metrics, and best practice recommendations. A written personalized care plan for preventive services as well as general preventive health recommendations were provided to patient.   Signed,  Tyler Aas, LPN Nurse Health Advisor   Nurse Notes:none

## 2018-03-18 NOTE — Patient Instructions (Signed)
Stacy Mercado , Thank you for taking time to come for your Medicare Wellness Visit. I appreciate your ongoing commitment to your health goals. Please review the following plan we discussed and let me know if I can assist you in the future.   Screening recommendations/referrals: Colonoscopy: cologuard completed 05/24/2017 Mammogram: due,  Please call [phone] to schedule your mammogram.  Bone Density: due, Please call [phone] to schedule your bone density. Recommended yearly ophthalmology/optometry visit for glaucoma screening and checkup Recommended yearly dental visit for hygiene and checkup  Vaccinations: Influenza vaccine: up to date Pneumococcal vaccine: up to date Tdap vaccine: up to date  Shingles vaccine: completed in 2014, eligible for shingrix vaccine- check with your pharmacy for insurance coverage   Advanced directives: Advance directive discussed with you today. Even though you declined this today please call our office should you change your mind and we can give you the proper paperwork for you to fill out.  Conditions/risks identified: Recommend drinking at least 6-8 glasses of water a day   Next appointment: Follow up in one year for your annual wellness exam.    Preventive Care 65 Years and Older, Female Preventive care refers to lifestyle choices and visits with your health care provider that can promote health and wellness. What does preventive care include?  A yearly physical exam. This is also called an annual well check.  Dental exams once or twice a year.  Routine eye exams. Ask your health care provider how often you should have your eyes checked.  Personal lifestyle choices, including:  Daily care of your teeth and gums.  Regular physical activity.  Eating a healthy diet.  Avoiding tobacco and drug use.  Limiting alcohol use.  Practicing safe sex.  Taking low-dose aspirin every day.  Taking vitamin and mineral supplements as recommended  by your health care provider. What happens during an annual well check? The services and screenings done by your health care provider during your annual well check will depend on your age, overall health, lifestyle risk factors, and family history of disease. Counseling  Your health care provider may ask you questions about your:  Alcohol use.  Tobacco use.  Drug use.  Emotional well-being.  Home and relationship well-being.  Sexual activity.  Eating habits.  History of falls.  Memory and ability to understand (cognition).  Work and work Statistician.  Reproductive health. Screening  You may have the following tests or measurements:  Height, weight, and BMI.  Blood pressure.  Lipid and cholesterol levels. These may be checked every 5 years, or more frequently if you are over 6 years old.  Skin check.  Lung cancer screening. You may have this screening every year starting at age 74 if you have a 30-pack-year history of smoking and currently smoke or have quit within the past 15 years.  Fecal occult blood test (FOBT) of the stool. You may have this test every year starting at age 74.  Flexible sigmoidoscopy or colonoscopy. You may have a sigmoidoscopy every 5 years or a colonoscopy every 10 years starting at age 74.  Hepatitis C blood test.  Hepatitis B blood test.  Sexually transmitted disease (STD) testing.  Diabetes screening. This is done by checking your blood sugar (glucose) after you have not eaten for a while (fasting). You may have this done every 1-3 years.  Bone density scan. This is done to screen for osteoporosis. You may have this done starting at age 74.  Mammogram. This may be done every  1-2 years. Talk to your health care provider about how often you should have regular mammograms. Talk with your health care provider about your test results, treatment options, and if necessary, the need for more tests. Vaccines  Your health care provider may  recommend certain vaccines, such as:  Influenza vaccine. This is recommended every year.  Tetanus, diphtheria, and acellular pertussis (Tdap, Td) vaccine. You may need a Td booster every 10 years.  Zoster vaccine. You may need this after age 21.  Pneumococcal 13-valent conjugate (PCV13) vaccine. One dose is recommended after age 62.  Pneumococcal polysaccharide (PPSV23) vaccine. One dose is recommended after age 74. Talk to your health care provider about which screenings and vaccines you need and how often you need them. This information is not intended to replace advice given to you by your health care provider. Make sure you discuss any questions you have with your health care provider. Document Released: 12/31/2015 Document Revised: 08/23/2016 Document Reviewed: 10/05/2015 Elsevier Interactive Patient Education  2017 [place] Prevention in the Home Falls can cause injuries. They can happen to people of all ages. There are many things you can do to make your home safe and to help prevent falls. What can I do on the outside of my home?  Regularly fix the edges of walkways and driveways and fix any cracks.  Remove anything that might make you trip as you walk through a door, such as a raised step or threshold.  Trim any bushes or trees on the path to your home.  Use bright outdoor lighting.  Clear any walking paths of anything that might make someone trip, such as rocks or tools.  Regularly check to see if handrails are loose or broken. Make sure that both sides of any steps have handrails.  Any raised decks and porches should have guardrails on the edges.  Have any leaves, snow, or ice cleared regularly.  Use sand or salt on walking paths during winter.  Clean up any spills in your garage right away. This includes oil or grease spills. What can I do in the bathroom?  Use night lights.  Install grab bars by the toilet and in the tub and shower. Do not use towel  bars as grab bars.  Use non-skid mats or decals in the tub or shower.  If you need to sit down in the shower, use a plastic, non-slip stool.  Keep the floor dry. Clean up any water that spills on the floor as soon as it happens.  Remove soap buildup in the tub or shower regularly.  Attach bath mats securely with double-sided non-slip rug tape.  Do not have throw rugs and other things on the floor that can make you trip. What can I do in the bedroom?  Use night lights.  Make sure that you have a light by your bed that is easy to reach.  Do not use any sheets or blankets that are too big for your bed. They should not hang down onto the floor.  Have a firm chair that has side arms. You can use this for support while you get dressed.  Do not have throw rugs and other things on the floor that can make you trip. What can I do in the kitchen?  Clean up any spills right away.  Avoid walking on wet floors.  Keep items that you use a lot in easy-to-reach places.  If you need to reach something above you, use a strong  step stool that has a grab bar.  Keep electrical cords out of the way.  Do not use floor polish or wax that makes floors slippery. If you must use wax, use non-skid floor wax.  Do not have throw rugs and other things on the floor that can make you trip. What can I do with my stairs?  Do not leave any items on the stairs.  Make sure that there are handrails on both sides of the stairs and use them. Fix handrails that are broken or loose. Make sure that handrails are as long as the stairways.  Check any carpeting to make sure that it is firmly attached to the stairs. Fix any carpet that is loose or worn.  Avoid having throw rugs at the top or bottom of the stairs. If you do have throw rugs, attach them to the floor with carpet tape.  Make sure that you have a light switch at the top of the stairs and the bottom of the stairs. If you do not have them, ask someone to  add them for you. What else can I do to help prevent falls?  Wear shoes that:  Do not have high heels.  Have rubber bottoms.  Are comfortable and fit you well.  Are closed at the toe. Do not wear sandals.  If you use a stepladder:  Make sure that it is fully opened. Do not climb a closed stepladder.  Make sure that both sides of the stepladder are locked into place.  Ask someone to hold it for you, if possible.  Clearly mark and make sure that you can see:  Any grab bars or handrails.  First and last steps.  Where the edge of each step is.  Use tools that help you move around (mobility aids) if they are needed. These include:  Canes.  Walkers.  Scooters.  Crutches.  Turn on the lights when you go into a dark area. Replace any light bulbs as soon as they burn out.  Set up your furniture so you have a clear path. Avoid moving your furniture around.  If any of your floors are uneven, fix them.  If there are any pets around you, be aware of where they are.  Review your medicines with your doctor. Some medicines can make you feel dizzy. This can increase your chance of falling. Ask your doctor what other things that you can do to help prevent falls. This information is not intended to replace advice given to you by your health care provider. Make sure you discuss any questions you have with your health care provider. Document Released: 09/30/2009 Document Revised: 05/11/2016 Document Reviewed: 01/08/2015 Elsevier Interactive Patient Education  2017 [employer].

## 2018-03-18 NOTE — Assessment & Plan Note (Signed)
Under good control with A1c of 5.4. Will cut her metformin in 1/2 and recheck 6 months. Call with any concerns.

## 2018-03-18 NOTE — Assessment & Plan Note (Signed)
Continue to follow with urology. Call with any concerns. Rechecking urine today.

## 2018-03-18 NOTE — Assessment & Plan Note (Signed)
Still having pain in her shoulder. Will start naproxen and will give small supply of tramadol. Should last at least 3 months. Call with any concerns.

## 2018-03-18 NOTE — Assessment & Plan Note (Signed)
Rechecking levels. Await results. Treat as needed. Call with any concerns.

## 2018-03-19 LAB — TSH: TSH: 4.71 u[IU]/mL — ABNORMAL HIGH (ref 0.450–4.500)

## 2018-03-19 LAB — CBC WITH DIFFERENTIAL/PLATELET
BASOS ABS: 0.1 10*3/uL (ref 0.0–0.2)
Basos: 1 %
EOS (ABSOLUTE): 0.3 10*3/uL (ref 0.0–0.4)
Eos: 5 %
Hematocrit: 38.3 % (ref 34.0–46.6)
Hemoglobin: 12.2 g/dL (ref 11.1–15.9)
IMMATURE GRANS (ABS): 0 10*3/uL (ref 0.0–0.1)
Immature Granulocytes: 0 %
LYMPHS ABS: 2.4 10*3/uL (ref 0.7–3.1)
LYMPHS: 42 %
MCH: 27.9 pg (ref 26.6–33.0)
MCHC: 31.9 g/dL (ref 31.5–35.7)
MCV: 87 fL (ref 79–97)
MONOS ABS: 0.8 10*3/uL (ref 0.1–0.9)
Monocytes: 13 %
NEUTROS ABS: 2.3 10*3/uL (ref 1.4–7.0)
Neutrophils: 39 %
PLATELETS: 237 10*3/uL (ref 150–379)
RBC: 4.38 x10E6/uL (ref 3.77–5.28)
RDW: 14.7 % (ref 12.3–15.4)
WBC: 5.7 10*3/uL (ref 3.4–10.8)

## 2018-03-19 LAB — COMPREHENSIVE METABOLIC PANEL
A/G RATIO: 1.6 (ref 1.2–2.2)
ALK PHOS: 77 IU/L (ref 39–117)
ALT: 17 IU/L (ref 0–32)
AST: 16 IU/L (ref 0–40)
Albumin: 4.1 g/dL (ref 3.5–4.8)
BILIRUBIN TOTAL: 0.3 mg/dL (ref 0.0–1.2)
BUN/Creatinine Ratio: 25 (ref 12–28)
BUN: 17 mg/dL (ref 8–27)
CHLORIDE: 103 mmol/L (ref 96–106)
CO2: 24 mmol/L (ref 20–29)
Calcium: 10.4 mg/dL — ABNORMAL HIGH (ref 8.7–10.3)
Creatinine, Ser: 0.68 mg/dL (ref 0.57–1.00)
GFR calc Af Amer: 100 mL/min/{1.73_m2} (ref >59)
GFR calc non Af Amer: 87 mL/min/{1.73_m2} (ref >59)
Globulin, Total: 2.5 g/dL (ref 1.5–4.5)
Glucose: 135 mg/dL — ABNORMAL HIGH (ref 65–99)
POTASSIUM: 4.4 mmol/L (ref 3.5–5.2)
Sodium: 145 mmol/L — ABNORMAL HIGH (ref 134–144)
Total Protein: 6.6 g/dL (ref 6.0–8.5)

## 2018-03-19 LAB — LIPID PANEL W/O CHOL/HDL RATIO
CHOLESTEROL TOTAL: 240 mg/dL — AB (ref 100–199)
HDL: 54 mg/dL (ref >39)
LDL Calculated: 157 mg/dL — ABNORMAL HIGH (ref 0–99)
Triglycerides: 144 mg/dL (ref 0–149)
VLDL CHOLESTEROL CAL: 29 mg/dL (ref 5–40)

## 2018-03-20 ENCOUNTER — Telehealth: Payer: Self-pay | Admitting: Family Medicine

## 2018-03-20 NOTE — Telephone Encounter (Signed)
Copied from [place] [id number]. Topic: Quick Communication - Rx Refill/Question >> Mar 20, 2018 12:32 PM Neva Seat wrote: Pt was asked to call Dr. Wynetta Emery back regarding the cost of the follow Rx's:  Ciclopirox 8 %  - $18.45 Naproxen 500 mg tabs - $15.00  Pt states she has to have her Rx sent to [facility] for insurance purposes.

## 2018-03-23 ENCOUNTER — Telehealth: Payer: Self-pay | Admitting: Family Medicine

## 2018-03-23 DIAGNOSIS — R7989 Other specified abnormal findings of blood chemistry: Secondary | ICD-10-CM

## 2018-03-23 NOTE — Telephone Encounter (Signed)
Please let Stacy Mercado know that her labs look good and are staying stable, but her thyroid function came back very very slightly underactive. I'd like her to just come in in about a month just for a blood test (order in) so we can see if this is real or just a fluke. Everything else looks great. Thanks!

## 2018-03-25 MED ORDER — CICLOPIROX 8 % EX SOLN: CUTANEOUS | 3 refills | Status: DC

## 2018-03-25 NOTE — Telephone Encounter (Signed)
Patient notified

## 2018-03-25 NOTE — Telephone Encounter (Signed)
Patient would like the prescription for Penlac sent to [facility]

## 2018-04-24 ENCOUNTER — Other Ambulatory Visit: Payer: Self-pay | Admitting: Family Medicine

## 2018-04-24 ENCOUNTER — Other Ambulatory Visit: Payer: Medicare Other

## 2018-04-24 DIAGNOSIS — I129 Hypertensive chronic kidney disease with stage 1 through stage 4 chronic kidney disease, or unspecified chronic kidney disease: Secondary | ICD-10-CM | POA: Diagnosis not present

## 2018-04-24 DIAGNOSIS — R7989 Other specified abnormal findings of blood chemistry: Secondary | ICD-10-CM | POA: Diagnosis not present

## 2018-04-24 DIAGNOSIS — E039 Hypothyroidism, unspecified: Secondary | ICD-10-CM

## 2018-04-25 LAB — THYROID PANEL WITH TSH
FREE THYROXINE INDEX: 1.4 (ref 1.2–4.9)
T3 UPTAKE RATIO: 26 % (ref 24–39)
T4 TOTAL: 5.3 ug/dL (ref 4.5–12.0)
TSH: 5.8 u[IU]/mL — AB (ref 0.450–4.500)

## 2018-04-26 ENCOUNTER — Telehealth: Payer: Self-pay | Admitting: Family Medicine

## 2018-04-26 DIAGNOSIS — E039 Hypothyroidism, unspecified: Secondary | ICD-10-CM | POA: Insufficient documentation

## 2018-04-26 DIAGNOSIS — E038 Other specified hypothyroidism: Secondary | ICD-10-CM

## 2018-04-26 NOTE — Telephone Encounter (Addendum)
Pt given lab results per notes of Dr. Wynetta Emery on 04/26/18. Pt verbalized understanding. Pt states she has been experiencing weight gain and states she has gained 5-6 lb in the last month and also experiences fatigue. Pt states she is wiling to start on medication for her thyroid and would like for the medication to be sent to [facility] in [place]. Pt also voicing concern over the price of the new rx being to expensive.Lab appt scheduled for 06/07/18 for recheck of thyroid level.

## 2018-04-26 NOTE — Telephone Encounter (Signed)
Please let her know that her thyroid is still a little underactive. We can treat this with some thyroid medicine or we can keep watching it. If she is having any fatigue, weight gain, constipation or feeling cold, we should treat it, otherwise we can just watch it. Let me know if she wants me to send her in something. If she does we'll recheck her levels in 6 weeks, otherwise we'll recheck them at her follow up in 6 months.

## 2018-04-26 NOTE — Telephone Encounter (Signed)
Left message on machine for pt to return call to the office. CRM created. Okay for nurse to relay message.

## 2018-04-29 ENCOUNTER — Telehealth: Payer: Self-pay | Admitting: Family Medicine

## 2018-04-29 ENCOUNTER — Telehealth: Payer: Self-pay | Admitting: Radiology

## 2018-04-29 MED ORDER — LEVOTHYROXINE SODIUM 25 MCG PO TABS: 25.0000 ug | ORAL_TABLET | Freq: Every day | ORAL | 2 refills | Status: DC

## 2018-04-29 MED ORDER — LEVOTHYROXINE SODIUM 25 MCG PO CAPS: 25.0000 ug | ORAL_CAPSULE | Freq: Every day | ORAL | 2 refills | Status: DC

## 2018-04-29 NOTE — Telephone Encounter (Signed)
Spoke to Dr. Wynetta Emery in person. Called and spoke to pharmacy tech at [facility]. She states that it is looking like the patient will have a $0 copay on the levothyroxine. Called and let the patient know this. Patient states that she is going to try to get the medication from [facility] but if it is more expensive, she is going to call us to have it resent to [facility]. Patient states that she had a hard time getting the prescription transferred this morning so she does not want to do that again. Will call us back in the morning if she cannot get the RX from [facility].

## 2018-04-29 NOTE — Telephone Encounter (Signed)
Routing to provider. Can we change RX to tablets due to the price?

## 2018-04-29 NOTE — Telephone Encounter (Signed)
Im sorry Dr. Wynetta Emery, can we send to [facility] for the patient instead of [facility]?

## 2018-04-29 NOTE — Telephone Encounter (Signed)
Pt requests samples of [name] to be left at front desk. Pt may be reached at [phone].

## 2018-04-29 NOTE — Telephone Encounter (Signed)
Copied from [place] [id number]. Topic: Quick Communication - See Telephone Encounter >> Apr 29, 2018  3:15 PM Rutherford Nail, [place] wrote: CRM for notification. See Telephone encounter for: 04/29/18. Patient calling and states that the medication Levothyroxine Sodium 25 MCG CAPS that was sent to the pharmacy today is going to be $142.64 at [facility]. States they can get her the same medication, but tablets for $4.00. States she needs a new prescription written for Tablets.  [facility] [place] ([facility] [place] - [place] CB#:[phone]

## 2018-04-29 NOTE — Telephone Encounter (Signed)
Patient notified. Patient is calling [facility] to transfer her medication to [facility]. Explained to call with any other questions or concerns.  Patient understood.

## 2018-04-29 NOTE — Telephone Encounter (Signed)
Rx sent to her pharmacy 

## 2018-04-29 NOTE — Addendum Note (Signed)
Addended by: Valerie Roys on: 04/29/2018 08:13 AM   Modules accepted: Orders

## 2018-04-29 NOTE — Telephone Encounter (Signed)
Rx sent to her pharmacy. It is very inexpensive- $4 at [employer] without insurance. Thanks!

## 2018-05-03 NOTE — Telephone Encounter (Signed)
Patient notified RX is up front

## 2018-05-15 ENCOUNTER — Telehealth: Payer: Self-pay | Admitting: Family Medicine

## 2018-05-15 DIAGNOSIS — Z780 Asymptomatic menopausal state: Secondary | ICD-10-CM

## 2018-05-15 DIAGNOSIS — Z1239 Encounter for other screening for malignant neoplasm of breast: Secondary | ICD-10-CM

## 2018-05-15 NOTE — Telephone Encounter (Signed)
Copied from [place] [id number]. Topic: Inquiry >> May 15, 2018  4:07 PM Margot Ables wrote: Reason for CRM: pt is requesting Dr. Wynetta Emery to place new orders for mammogram and bone density to [facility] at [facility]. She didn't get there before the orders expired.

## 2018-05-16 NOTE — Telephone Encounter (Signed)
Routing to provider  

## 2018-05-17 NOTE — Addendum Note (Signed)
Addended by: Valerie Roys on: 05/17/2018 03:24 PM   Modules accepted: Orders

## 2018-05-29 NOTE — Progress Notes (Signed)
Patient seen by different provider

## 2018-05-30 ENCOUNTER — Ambulatory Visit (INDEPENDENT_AMBULATORY_CARE_PROVIDER_SITE_OTHER): Payer: Medicare Other | Admitting: Family Medicine

## 2018-05-30 ENCOUNTER — Encounter: Payer: Self-pay | Admitting: Family Medicine

## 2018-05-30 ENCOUNTER — Ambulatory Visit
Admission: RE | Admit: 2018-05-30 | Discharge: 2018-05-30 | Disposition: A | Discharge status: Home / Self Care | Payer: Medicare Other | Source: Ambulatory Visit | Attending: Physician Assistant | Admitting: Physician Assistant

## 2018-05-30 ENCOUNTER — Ambulatory Visit
Admission: RE | Admit: 2018-05-30 | Discharge: 2018-05-30 | Disposition: A | Discharge status: Home / Self Care | Payer: Medicare Other | Source: Ambulatory Visit | Attending: Family Medicine | Admitting: Family Medicine

## 2018-05-30 VITALS — BP 149/79 | HR 56 | Temp 98.0°F | Wt 175.4 lb

## 2018-05-30 DIAGNOSIS — Z9889 Other specified postprocedural states: Secondary | ICD-10-CM | POA: Diagnosis not present

## 2018-05-30 DIAGNOSIS — M25511 Pain in right shoulder: Secondary | ICD-10-CM | POA: Diagnosis not present

## 2018-05-30 DIAGNOSIS — S46212A Strain of muscle, fascia and tendon of other parts of biceps, left arm, initial encounter: Secondary | ICD-10-CM | POA: Diagnosis not present

## 2018-05-30 DIAGNOSIS — Z471 Aftercare following joint replacement surgery: Secondary | ICD-10-CM | POA: Diagnosis not present

## 2018-05-30 DIAGNOSIS — E039 Hypothyroidism, unspecified: Secondary | ICD-10-CM | POA: Diagnosis not present

## 2018-05-30 DIAGNOSIS — Z96611 Presence of right artificial shoulder joint: Secondary | ICD-10-CM | POA: Diagnosis not present

## 2018-05-30 DIAGNOSIS — E038 Other specified hypothyroidism: Secondary | ICD-10-CM

## 2018-05-30 MED ORDER — TIZANIDINE HCL 4 MG PO TABS: 4.0000 mg | ORAL_TABLET | Freq: Four times a day (QID) | ORAL | 0 refills | Status: DC | PRN

## 2018-05-30 NOTE — Progress Notes (Signed)
BP (!) 149/79 (BP Location: Left Arm, Patient Position: Sitting, Cuff Size: Normal)   Pulse (!) 56   Temp 98 F (36.7 C)   Wt 175 lb 7 oz (79.6 kg)   SpO2 97%   BMI 30.11 kg/m    Subjective:    Patient ID: Stacy Mercado, female    DOB: [birth year], 72 y.o.   MRN: [id number]  HPI: Stacy Mercado is a 72 y.o. female  Chief Complaint  Patient presents with  . Mass  . Hypothyroidism   LUMP Duration: Past couple of days Location: anterior R side of her neck Onset: unsure- just noticed this  Painful: no Discomfort: no Status:  not changing Trauma: no Redness: no Bruising: no Recent infection: no Swollen lymph nodes: no Requesting removal: no  LUMP Duration: weeks Location: L arm just above her elbow Onset: sudden Painful: no Discomfort: no Status:  not changing Trauma: no Redness: no Bruising: no Recent infection: no Swollen lymph nodes: no Requesting removal: no History of cancer: no  HYPOTHYROIDISM Thyroid control status:better Satisfied with current treatment? yes Medication side effects: no Medication compliance: excellent compliance Etiology of hypothyroidism:  Recent dose adjustment:yes Fatigue: no Cold intolerance: no Heat intolerance: no Weight gain: no Weight loss: no Constipation: no Diarrhea/loose stools: no Palpitations: no Lower extremity edema: no Anxiety/depressed mood: no  Relevant past medical, surgical, family and social history reviewed and updated as indicated. Interim medical history since our last visit reviewed. Allergies and medications reviewed and updated.  Review of Systems  Constitutional: Negative.   Respiratory: Negative.   Cardiovascular: Negative.   Musculoskeletal: Positive for myalgias. Negative for arthralgias, back pain, gait problem, joint swelling, neck pain and neck stiffness.  Skin: Negative.   Psychiatric/Behavioral: Negative.     Per HPI unless specifically indicated above     Objective:      BP (!) 149/79 (BP Location: Left Arm, Patient Position: Sitting, Cuff Size: Normal)   Pulse (!) 56   Temp 98 F (36.7 C)   Wt 175 lb 7 oz (79.6 kg)   SpO2 97%   BMI 30.11 kg/m   Wt Readings from Last 3 Encounters:  05/30/18 175 lb 7 oz (79.6 kg)  03/18/18 169 lb 3.2 oz (76.7 kg)  03/18/18 169 lb 2 oz (76.7 kg)    Physical Exam  Constitutional: She is oriented to person, place, and time. She appears well-developed and well-nourished. No distress.  HENT:  Head: Normocephalic and atraumatic.  Right Ear: Hearing normal.  Left Ear: Hearing normal.  Nose: Nose normal.  Eyes: Conjunctivae and lids are normal. Right eye exhibits no discharge. Left eye exhibits no discharge. No scleral icterus.  Cardiovascular: Normal rate, regular rhythm, normal heart sounds and intact distal pulses. Exam reveals no gallop and no friction rub.  No murmur heard. Pulmonary/Chest: Effort normal and breath sounds normal. No stridor. No respiratory distress. She has no wheezes. She has no rales. She exhibits no tenderness.  Musculoskeletal: Normal range of motion.  Lump on anterior R side of her neck seems to be her clavicle.   Lump just above her L elbow, non-tender to palpation in the bicep  Neurological: She is alert and oriented to person, place, and time.  Skin: Skin is warm, dry and intact. Capillary refill takes less than 2 seconds. No rash noted. She is not diaphoretic. No erythema. No pallor.  Psychiatric: She has a normal mood and affect. Her speech is normal and behavior is normal. Judgment and thought content  normal. Cognition and memory are normal.  Nursing note and vitals reviewed.   Results for orders placed or performed in visit on 05/30/18  TSH  Result Value Ref Range   TSH 4.190 0.450 - 4.500 uIU/mL      Assessment & Plan:   Problem List Items Addressed This Visit      Endocrine   Subclinical hypothyroidism - Primary    Due for recheck on her thyroid levels. Rechecking levels  today. Will adjust dose as needed. Call with any concerns.        Other Visit Diagnoses    Rupture of left proximal biceps tendon, initial encounter       Significant concern for biceps rupture. Will get her into orthopedics for evaluation. Call with any concerns.    Relevant Orders   Ambulatory referral to Orthopedic Surgery   Arthralgia of right acromioclavicular joint       Seems to be her clavicle. Will obtain x-ray to make sure there is no sign of any arthritis. Await results.    Relevant Orders   DG Clavicle Right (Completed)       Follow up plan: Return As scheduled for DM follow up.

## 2018-05-30 NOTE — Patient Instructions (Signed)

## 2018-05-31 ENCOUNTER — Telehealth: Payer: Self-pay | Admitting: Family Medicine

## 2018-05-31 DIAGNOSIS — E039 Hypothyroidism, unspecified: Secondary | ICD-10-CM

## 2018-05-31 DIAGNOSIS — E038 Other specified hypothyroidism: Secondary | ICD-10-CM

## 2018-05-31 LAB — TSH: TSH: 4.19 u[IU]/mL (ref 0.450–4.500)

## 2018-05-31 MED ORDER — LEVOTHYROXINE SODIUM 50 MCG PO TABS: 50.0000 ug | ORAL_TABLET | Freq: Every day | ORAL | 2 refills | Status: DC

## 2018-05-31 NOTE — Telephone Encounter (Signed)
Called patient, no answer, will try again 

## 2018-05-31 NOTE — Telephone Encounter (Addendum)
Please let Stacy Mercado know that her thyroid is better, but still not quite there. I'm going ot increase her to 68mcg and we'll recheck it in 6 weeks (just a lab visit) order in.   Please also let her know that her x-ray came back normal. No sign of arthritis. Everything looks normal. Thanks!

## 2018-05-31 NOTE — Telephone Encounter (Signed)
Patient notified

## 2018-06-02 ENCOUNTER — Encounter: Payer: Self-pay | Admitting: Family Medicine

## 2018-06-02 NOTE — Assessment & Plan Note (Signed)
Due for recheck on her thyroid levels. Rechecking levels today. Will adjust dose as needed. Call with any concerns.

## 2018-06-04 DIAGNOSIS — S46212A Strain of muscle, fascia and tendon of other parts of biceps, left arm, initial encounter: Secondary | ICD-10-CM | POA: Diagnosis not present

## 2018-06-04 DIAGNOSIS — M7582 Other shoulder lesions, left shoulder: Secondary | ICD-10-CM | POA: Diagnosis not present

## 2018-06-05 ENCOUNTER — Other Ambulatory Visit: Payer: Self-pay | Admitting: Student

## 2018-06-06 ENCOUNTER — Other Ambulatory Visit: Payer: Self-pay | Admitting: Student

## 2018-06-06 DIAGNOSIS — M7582 Other shoulder lesions, left shoulder: Secondary | ICD-10-CM

## 2018-06-06 DIAGNOSIS — S46212A Strain of muscle, fascia and tendon of other parts of biceps, left arm, initial encounter: Secondary | ICD-10-CM

## 2018-06-07 ENCOUNTER — Other Ambulatory Visit: Payer: Self-pay

## 2018-06-10 ENCOUNTER — Telehealth: Payer: Self-pay | Admitting: Urology

## 2018-06-10 NOTE — Telephone Encounter (Signed)
Patient requesting [name] 50mg  samples.    Placed 1 box of samples up front for patient to pick up.

## 2018-06-12 ENCOUNTER — Other Ambulatory Visit: Payer: Self-pay | Admitting: Family Medicine

## 2018-06-12 ENCOUNTER — Ambulatory Visit
Admission: RE | Admit: 2018-06-12 | Discharge: 2018-06-12 | Disposition: A | Discharge status: Home / Self Care | Payer: Medicare Other | Source: Ambulatory Visit | Attending: Family Medicine | Admitting: Family Medicine

## 2018-06-12 DIAGNOSIS — Z1231 Encounter for screening mammogram for malignant neoplasm of breast: Secondary | ICD-10-CM | POA: Diagnosis not present

## 2018-06-12 DIAGNOSIS — Z780 Asymptomatic menopausal state: Secondary | ICD-10-CM | POA: Diagnosis not present

## 2018-06-12 DIAGNOSIS — Z1239 Encounter for other screening for malignant neoplasm of breast: Secondary | ICD-10-CM

## 2018-06-12 DIAGNOSIS — Z13820 Encounter for screening for osteoporosis: Secondary | ICD-10-CM | POA: Diagnosis not present

## 2018-06-12 MED ORDER — MIRABEGRON ER 50 MG PO TB24: 50.0000 mg | ORAL_TABLET | Freq: Every day | ORAL | 3 refills | Status: DC

## 2018-06-13 ENCOUNTER — Encounter: Payer: Self-pay | Admitting: Family Medicine

## 2018-06-14 ENCOUNTER — Encounter: Payer: Self-pay | Admitting: Family Medicine

## 2018-06-14 DIAGNOSIS — M8580 Other specified disorders of bone density and structure, unspecified site: Secondary | ICD-10-CM | POA: Insufficient documentation

## 2018-06-25 ENCOUNTER — Ambulatory Visit
Admission: RE | Admit: 2018-06-25 | Discharge: 2018-06-25 | Disposition: A | Discharge status: Home / Self Care | Payer: Medicare Other | Source: Ambulatory Visit | Attending: Student | Admitting: Student

## 2018-06-25 DIAGNOSIS — X58XXXA Exposure to other specified factors, initial encounter: Secondary | ICD-10-CM | POA: Diagnosis not present

## 2018-06-25 DIAGNOSIS — M7582 Other shoulder lesions, left shoulder: Secondary | ICD-10-CM | POA: Diagnosis not present

## 2018-06-25 DIAGNOSIS — M25412 Effusion, left shoulder: Secondary | ICD-10-CM | POA: Insufficient documentation

## 2018-06-25 DIAGNOSIS — M75112 Incomplete rotator cuff tear or rupture of left shoulder, not specified as traumatic: Secondary | ICD-10-CM | POA: Diagnosis not present

## 2018-06-25 DIAGNOSIS — S46212A Strain of muscle, fascia and tendon of other parts of biceps, left arm, initial encounter: Secondary | ICD-10-CM

## 2018-06-25 DIAGNOSIS — M19012 Primary osteoarthritis, left shoulder: Secondary | ICD-10-CM | POA: Diagnosis not present

## 2018-07-02 DIAGNOSIS — S46212D Strain of muscle, fascia and tendon of other parts of biceps, left arm, subsequent encounter: Secondary | ICD-10-CM | POA: Diagnosis not present

## 2018-07-02 DIAGNOSIS — M7582 Other shoulder lesions, left shoulder: Secondary | ICD-10-CM | POA: Diagnosis not present

## 2018-07-04 DIAGNOSIS — M7582 Other shoulder lesions, left shoulder: Secondary | ICD-10-CM | POA: Diagnosis not present

## 2018-07-04 DIAGNOSIS — S46212D Strain of muscle, fascia and tendon of other parts of biceps, left arm, subsequent encounter: Secondary | ICD-10-CM | POA: Diagnosis not present

## 2018-07-12 ENCOUNTER — Other Ambulatory Visit: Payer: Medicare Other

## 2018-07-12 DIAGNOSIS — E038 Other specified hypothyroidism: Secondary | ICD-10-CM

## 2018-07-12 DIAGNOSIS — E039 Hypothyroidism, unspecified: Secondary | ICD-10-CM

## 2018-07-13 LAB — TSH: TSH: 1.83 u[IU]/mL (ref 0.450–4.500)

## 2018-07-15 ENCOUNTER — Telehealth: Payer: Self-pay | Admitting: Family Medicine

## 2018-07-15 MED ORDER — LEVOTHYROXINE SODIUM 50 MCG PO TABS: 50.0000 ug | ORAL_TABLET | Freq: Every day | ORAL | 3 refills | Status: DC

## 2018-07-15 NOTE — Telephone Encounter (Signed)
Patient notified

## 2018-07-15 NOTE — Telephone Encounter (Signed)
Please let her know that her thyroid level came back normal. I've sent a years supply to her pharmacy.

## 2018-08-05 ENCOUNTER — Telehealth: Payer: Self-pay | Admitting: Urology

## 2018-08-05 NOTE — Telephone Encounter (Signed)
If we have enough samples, by all means give her some.  It was very helpful if he could talk to the [name] wraps and see if they can help troubleshoot why her medication is so expensive and if there is any alternatives to finding a more low cost option for her.  Hollice Espy, MD

## 2018-08-05 NOTE — Telephone Encounter (Signed)
Called pt and advised pt to pick up her Rx from pharmacy, pt states that she did go to pharmacy, her Rx costs $394 for 30 pills, pt states she can not afford that and was told she could get sample from the office. Please advise pt at [phone].

## 2018-08-05 NOTE — Telephone Encounter (Signed)
Ok to give pt continuous samples or try alternate therapy. Pt has tried [place] and ditropan, but cannot afford mybertriq  Please advise. Thanks

## 2018-08-05 NOTE — Telephone Encounter (Signed)
Pt has received samples multiple times, will need to ger RX from pharmacy. RX sent in June 2019. Thanks

## 2018-08-05 NOTE — Telephone Encounter (Signed)
Pt is out of samples of Mybetriq, asking if she can pick up more samples. Please advise pt @ [phone]

## 2018-08-07 NOTE — Telephone Encounter (Signed)
Called pt informed her that samples were up front and ready for pick up.

## 2018-09-16 NOTE — Progress Notes (Signed)
BP 135/82   Pulse (!) 58   Ht 5\' 4"  (1.626 m)   Wt 183 lb (83 kg)   SpO2 96%   BMI 31.41 kg/m    Subjective:    Patient ID: Stacy Mercado, female    DOB: 1945-12-24, 72 y.o.   MRN: [id number]  HPI: Stacy Mercado is a 72 y.o. female  Chief Complaint  Patient presents with  . Follow-up  . Hypertension  . Hyperlipidemia  . Hypothyroidism   HYPERTENSION / HYPERLIPIDEMIA Satisfied with current treatment? yes Duration of hypertension: chronic BP monitoring frequency: not checking BP medication side effects: no Past BP meds: losartan Duration of hyperlipidemia: chronic Cholesterol medication side effects: yes- statin intolerant Cholesterol supplements: none Medication compliance: good compliance Aspirin: yes Recent stressors: no Recurrent headaches: no Visual changes: no Palpitations: no Dyspnea: no Chest pain: no Lower extremity edema: no Dizzy/lightheaded: no  DEPRESSION Mood status: controlled Satisfied with current treatment?: yes Symptom severity: mild   Duration of current treatment : chronic Side effects: no Medication compliance: good compliance Psychotherapy/counseling: no  Previous psychiatric medications: lexapro Depressed mood: no Anxious mood: no Anhedonia: no Significant weight loss or gain: no Insomnia: no  Fatigue: no Feelings of worthlessness or guilt: no Impaired concentration/indecisiveness: no Suicidal ideations: no Hopelessness: no Crying spells: no Depression screen [facility] 2/9 09/17/2018 03/18/2018 03/13/2017 10/18/2016 08/28/2016  Decreased Interest 0 0 0 0 0  Down, Depressed, Hopeless 0 0 1 0 0  PHQ - 2 Score 0 0 1 0 0  Altered sleeping 0 0 - - -  Tired, decreased energy 0 0 - - -  Change in appetite 0 0 - - -  Feeling bad or failure about yourself  0 0 - - -  Trouble concentrating 0 0 - - -  Moving slowly or fidgety/restless 0 0 - - -  Suicidal thoughts 0 0 - - -  PHQ-9 Score 0 0 - - -  Difficult doing work/chores Not  difficult at all Not difficult at all - - -   DIABETES Hypoglycemic episodes:no Polydipsia/polyuria: no Visual disturbance: no Chest pain: no Paresthesias: no Glucose Monitoring: no Blood Pressure Monitoring: a few times a month Retinal Examination: Not up to Date Foot Exam: Up to Date Diabetic Education: Completed Pneumovax: Up to Date Influenza: Up to Date Aspirin: yes  HYPOTHYROIDISM Thyroid control status:stable Satisfied with current treatment? yes Medication side effects: no Medication compliance: excellent compliance Recent dose adjustment:no Fatigue: yes Cold intolerance: no Heat intolerance: no Weight gain: yes Weight loss: no Constipation: no Diarrhea/loose stools: no Palpitations: no Lower extremity edema: no Anxiety/depressed mood: no  Relevant past medical, surgical, family and social history reviewed and updated as indicated. Interim medical history since our last visit reviewed. Allergies and medications reviewed and updated.  Review of Systems  Constitutional: Positive for unexpected weight change. Negative for activity change, appetite change, chills, diaphoresis, fatigue and fever.  Respiratory: Negative.   Cardiovascular: Negative.   Musculoskeletal: Negative.   Neurological: Negative.   Psychiatric/Behavioral: Negative.     Per HPI unless specifically indicated above     Objective:    BP 135/82   Pulse (!) 58   Ht 5\' 4"  (1.626 m)   Wt 183 lb (83 kg)   SpO2 96%   BMI 31.41 kg/m   Wt Readings from Last 3 Encounters:  09/17/18 183 lb (83 kg)  05/30/18 175 lb 7 oz (79.6 kg)  03/18/18 169 lb 3.2 oz (76.7 kg)    Physical  Exam  Constitutional: She is oriented to person, place, and time. She appears well-developed and well-nourished. No distress.  HENT:  Head: Normocephalic and atraumatic.  Right Ear: Hearing normal.  Left Ear: Hearing normal.  Nose: Nose normal.  Eyes: Conjunctivae and lids are normal. Right eye exhibits no discharge.  Left eye exhibits no discharge. No scleral icterus.  Cardiovascular: Normal rate, regular rhythm, normal heart sounds and intact distal pulses. Exam reveals no gallop and no friction rub.  No murmur heard. Pulmonary/Chest: Effort normal and breath sounds normal. No stridor. No respiratory distress. She has no wheezes. She has no rales. She exhibits no tenderness.  Musculoskeletal: Normal range of motion.  Neurological: She is alert and oriented to person, place, and time.  Skin: Skin is warm, dry and intact. Capillary refill takes less than 2 seconds. No rash noted. She is not diaphoretic. No erythema. No pallor.  Psychiatric: She has a normal mood and affect. Her speech is normal and behavior is normal. Judgment and thought content normal. Cognition and memory are normal.  Nursing note and vitals reviewed.   Results for orders placed or performed in visit on 07/12/18  TSH  Result Value Ref Range   TSH 1.830 0.450 - 4.500 uIU/mL      Assessment & Plan:   Problem List Items Addressed This Visit      Cardiovascular and Mediastinum   PVD (peripheral vascular disease) ([place])    Will keep BP and cholesterol under good control. Continue to monitor. Call with any concerns.       Relevant Medications   losartan (COZAAR) 50 MG tablet     Endocrine   Controlled type 2 diabetes mellitus ([place])    Sugars have been doing great. If continues to be under good control, will stop metformin. Call with any concerns.       Relevant Medications   losartan (COZAAR) 50 MG tablet   metFORMIN (GLUCOPHAGE) 1000 MG tablet   Other Relevant Orders   Bayer DCA Hb A1c Waived   Hypothyroidism    Has been gaining weight. Will check her labs and adjust dose as needed.         Nervous and Auditory   Sciatica of left side    Offered referral to orthopedics- she would like to hold off for now. Has not been taking her gabapentin. Will restart her gabapentin and if not improving, we will get her a referral to  orthopedics.      Relevant Medications   escitalopram (LEXAPRO) 10 MG tablet   gabapentin (NEURONTIN) 300 MG capsule   tiZANidine (ZANAFLEX) 4 MG tablet     Genitourinary   Benign hypertensive renal disease - Primary    Under good control on current regimen. Continue current regimen. Continue to monitor. Call with any concerns. Refills given.        Relevant Orders   CBC with Differential/Platelet   Comprehensive metabolic panel   TSH     Other   Depression    Under good control on current regimen. Continue current regimen. Continue to monitor. Call with any concerns. Refills given.        Relevant Medications   escitalopram (LEXAPRO) 10 MG tablet   Other Relevant Orders   CBC with Differential/Platelet   Comprehensive metabolic panel   TSH   Hyperlipidemia    Statin intolerant. Rechecking levels. Call with any concerns.       Relevant Medications   losartan (COZAAR) 50 MG tablet   Other Relevant  Orders   CBC with Differential/Platelet   Comprehensive metabolic panel   Lipid Panel w/o Chol/HDL Ratio    Other Visit Diagnoses    Needs flu shot       Flu shot given today.   Relevant Orders   Flu vaccine HIGH DOSE PF (Fluzone High dose) (Completed)       Follow up plan: Return in about 6 months (around 03/19/2019) for Physical.

## 2018-09-17 ENCOUNTER — Ambulatory Visit (INDEPENDENT_AMBULATORY_CARE_PROVIDER_SITE_OTHER): Payer: Medicare Other | Admitting: Family Medicine

## 2018-09-17 ENCOUNTER — Encounter: Payer: Self-pay | Admitting: Family Medicine

## 2018-09-17 VITALS — BP 135/82 | HR 58 | Ht 64.0 in | Wt 183.0 lb

## 2018-09-17 DIAGNOSIS — M5432 Sciatica, left side: Secondary | ICD-10-CM | POA: Diagnosis not present

## 2018-09-17 DIAGNOSIS — E782 Mixed hyperlipidemia: Secondary | ICD-10-CM

## 2018-09-17 DIAGNOSIS — E039 Hypothyroidism, unspecified: Secondary | ICD-10-CM

## 2018-09-17 DIAGNOSIS — Z23 Encounter for immunization: Secondary | ICD-10-CM

## 2018-09-17 DIAGNOSIS — E119 Type 2 diabetes mellitus without complications: Secondary | ICD-10-CM | POA: Diagnosis not present

## 2018-09-17 DIAGNOSIS — E038 Other specified hypothyroidism: Secondary | ICD-10-CM

## 2018-09-17 DIAGNOSIS — I129 Hypertensive chronic kidney disease with stage 1 through stage 4 chronic kidney disease, or unspecified chronic kidney disease: Secondary | ICD-10-CM | POA: Diagnosis not present

## 2018-09-17 DIAGNOSIS — F3341 Major depressive disorder, recurrent, in partial remission: Secondary | ICD-10-CM | POA: Diagnosis not present

## 2018-09-17 DIAGNOSIS — I739 Peripheral vascular disease, unspecified: Secondary | ICD-10-CM

## 2018-09-17 LAB — BAYER DCA HB A1C WAIVED: HB A1C (BAYER DCA - WAIVED): 5.7 % (ref <7.0)

## 2018-09-17 MED ORDER — LOSARTAN POTASSIUM 50 MG PO TABS: 50.0000 mg | ORAL_TABLET | Freq: Every day | ORAL | 1 refills | Status: DC

## 2018-09-17 MED ORDER — NAPROXEN 500 MG PO TABS: 500.0000 mg | ORAL_TABLET | Freq: Two times a day (BID) | ORAL | 1 refills | Status: DC

## 2018-09-17 MED ORDER — ESCITALOPRAM OXALATE 10 MG PO TABS: 10.0000 mg | ORAL_TABLET | Freq: Every day | ORAL | 1 refills | Status: DC

## 2018-09-17 MED ORDER — METFORMIN HCL 1000 MG PO TABS: 1000.0000 mg | ORAL_TABLET | Freq: Every day | ORAL | 1 refills | Status: DC

## 2018-09-17 MED ORDER — TIZANIDINE HCL 4 MG PO TABS: 4.0000 mg | ORAL_TABLET | Freq: Four times a day (QID) | ORAL | 1 refills | Status: DC | PRN

## 2018-09-17 MED ORDER — GABAPENTIN 300 MG PO CAPS: ORAL_CAPSULE | ORAL | 1 refills | Status: DC

## 2018-09-17 NOTE — Assessment & Plan Note (Signed)
Under good control on current regimen. Continue current regimen. Continue to monitor. Call with any concerns. Refills given.   

## 2018-09-17 NOTE — Assessment & Plan Note (Signed)
Offered referral to orthopedics- she would like to hold off for now. Has not been taking her gabapentin. Will restart her gabapentin and if not improving, we will get her a referral to orthopedics.

## 2018-09-17 NOTE — Patient Instructions (Signed)

## 2018-09-17 NOTE — Assessment & Plan Note (Signed)
Will keep BP and cholesterol under good control. Continue to monitor. Call with any concerns.  

## 2018-09-17 NOTE — Assessment & Plan Note (Signed)
Has been gaining weight. Will check her labs and adjust dose as needed.

## 2018-09-17 NOTE — Assessment & Plan Note (Signed)
Statin intolerant. Rechecking levels. Call with any concerns.

## 2018-09-17 NOTE — Assessment & Plan Note (Signed)
Sugars have been doing great. If continues to be under good control, will stop metformin. Call with any concerns.

## 2018-09-18 LAB — COMPREHENSIVE METABOLIC PANEL
ALK PHOS: 93 IU/L (ref 39–117)
ALT: 13 IU/L (ref 0–32)
AST: 13 IU/L (ref 0–40)
Albumin/Globulin Ratio: 1.9 (ref 1.2–2.2)
Albumin: 4.1 g/dL (ref 3.5–4.8)
BUN / CREAT RATIO: 21 (ref 12–28)
BUN: 16 mg/dL (ref 8–27)
Bilirubin Total: 0.4 mg/dL (ref 0.0–1.2)
CO2: 24 mmol/L (ref 20–29)
CREATININE: 0.77 mg/dL (ref 0.57–1.00)
Calcium: 10.7 mg/dL — ABNORMAL HIGH (ref 8.7–10.3)
Chloride: 105 mmol/L (ref 96–106)
GFR calc Af Amer: 89 mL/min/{1.73_m2} (ref >59)
GFR calc non Af Amer: 77 mL/min/{1.73_m2} (ref >59)
Globulin, Total: 2.2 g/dL (ref 1.5–4.5)
Glucose: 109 mg/dL — ABNORMAL HIGH (ref 65–99)
Potassium: 4.5 mmol/L (ref 3.5–5.2)
Sodium: 146 mmol/L — ABNORMAL HIGH (ref 134–144)
TOTAL PROTEIN: 6.3 g/dL (ref 6.0–8.5)

## 2018-09-18 LAB — LIPID PANEL W/O CHOL/HDL RATIO
CHOLESTEROL TOTAL: 241 mg/dL — AB (ref 100–199)
HDL: 48 mg/dL (ref >39)
LDL CALC: 157 mg/dL — AB (ref 0–99)
Triglycerides: 178 mg/dL — ABNORMAL HIGH (ref 0–149)
VLDL CHOLESTEROL CAL: 36 mg/dL (ref 5–40)

## 2018-09-18 LAB — CBC WITH DIFFERENTIAL/PLATELET
BASOS ABS: 0.1 10*3/uL (ref 0.0–0.2)
Basos: 1 %
EOS (ABSOLUTE): 0.2 10*3/uL (ref 0.0–0.4)
Eos: 3 %
HEMOGLOBIN: 11.9 g/dL (ref 11.1–15.9)
Hematocrit: 37 % (ref 34.0–46.6)
IMMATURE GRANS (ABS): 0 10*3/uL (ref 0.0–0.1)
Immature Granulocytes: 0 %
LYMPHS: 32 %
Lymphocytes Absolute: 2 10*3/uL (ref 0.7–3.1)
MCH: 28.8 pg (ref 26.6–33.0)
MCHC: 32.2 g/dL (ref 31.5–35.7)
MCV: 90 fL (ref 79–97)
MONOCYTES: 11 %
Monocytes Absolute: 0.7 10*3/uL (ref 0.1–0.9)
Neutrophils Absolute: 3.3 10*3/uL (ref 1.4–7.0)
Neutrophils: 53 %
PLATELETS: 227 10*3/uL (ref 150–450)
RBC: 4.13 x10E6/uL (ref 3.77–5.28)
RDW: 14.1 % (ref 12.3–15.4)
WBC: 6.3 10*3/uL (ref 3.4–10.8)

## 2018-09-18 LAB — TSH: TSH: 4.24 u[IU]/mL (ref 0.450–4.500)

## 2018-09-24 ENCOUNTER — Other Ambulatory Visit: Payer: Self-pay | Admitting: Family Medicine

## 2018-09-24 NOTE — Telephone Encounter (Signed)
Please let her know that her labs came back normal except her cholesterol came back up a bit. Her sugars look good enough where I think she can stop her metformin, and we'll recheck in 3 months. I would like to start her on zetia to treat her cholesterol since she can't take statins. Please let me know if she's OK with this thanks!

## 2018-09-25 MED ORDER — EZETIMIBE 10 MG PO TABS: 10.0000 mg | ORAL_TABLET | Freq: Every day | ORAL | 3 refills | Status: DC

## 2018-09-25 NOTE — Telephone Encounter (Signed)
Patient notified and verbalized understanding about medications. Patient states she is OK with starting the zetia. Would like it sent to [facility].

## 2018-09-30 ENCOUNTER — Telehealth: Payer: Self-pay | Admitting: Urology

## 2018-09-30 NOTE — Telephone Encounter (Signed)
Pt called office stating she needs more 50mg  mybetriq samples, Please let pt know when they are ready for pick up.

## 2018-10-01 NOTE — Telephone Encounter (Signed)
Patient notified to pick up samples.

## 2018-11-18 ENCOUNTER — Telehealth: Payer: Self-pay | Admitting: Urology

## 2018-11-18 NOTE — Telephone Encounter (Signed)
Patient called the office today requesting 2 boxes of samples of 50mg  [name] as she will be going out of town.  I left 2 boxes at the front desk for patient to pick up.

## 2018-12-10 DIAGNOSIS — M5442 Lumbago with sciatica, left side: Secondary | ICD-10-CM | POA: Diagnosis not present

## 2018-12-10 DIAGNOSIS — M7062 Trochanteric bursitis, left hip: Secondary | ICD-10-CM | POA: Diagnosis not present

## 2018-12-10 DIAGNOSIS — S76012A Strain of muscle, fascia and tendon of left hip, initial encounter: Secondary | ICD-10-CM | POA: Diagnosis not present

## 2019-01-22 ENCOUNTER — Telehealth: Payer: Self-pay | Admitting: Urology

## 2019-01-22 MED ORDER — MIRABEGRON ER 50 MG PO TB24: 50.0000 mg | ORAL_TABLET | Freq: Every day | ORAL | 3 refills | Status: DC

## 2019-01-22 NOTE — Telephone Encounter (Signed)
Rx sent to pharmacy   

## 2019-01-22 NOTE — Telephone Encounter (Signed)
Pt called and wants to know if she can get some samples of [name] 50 mg.  Please call pt.

## 2019-01-23 ENCOUNTER — Telehealth: Payer: Self-pay | Admitting: Urology

## 2019-01-23 NOTE — Telephone Encounter (Signed)
Pt Stacy Mercado and requests samples for [name] 50mg , She asked for a call back also please.

## 2019-01-24 NOTE — Telephone Encounter (Signed)
Pt called office and stated RX was going to cost $400 per month.  [employer] [employer] got 2 boxes of [name] 50 mg samples and left up front for pt to pick up.

## 2019-02-14 ENCOUNTER — Ambulatory Visit
Admission: RE | Admit: 2019-02-14 | Discharge: 2019-02-14 | Disposition: A | Discharge status: Home / Self Care | Payer: Medicare Other | Source: Ambulatory Visit | Attending: Urology | Admitting: Urology

## 2019-02-14 DIAGNOSIS — N2889 Other specified disorders of kidney and ureter: Secondary | ICD-10-CM | POA: Insufficient documentation

## 2019-02-19 ENCOUNTER — Other Ambulatory Visit: Payer: Medicare Other | Admitting: Urology

## 2019-03-05 ENCOUNTER — Ambulatory Visit (INDEPENDENT_AMBULATORY_CARE_PROVIDER_SITE_OTHER): Payer: Medicare Other | Admitting: Urology

## 2019-03-05 ENCOUNTER — Other Ambulatory Visit: Payer: Self-pay

## 2019-03-05 ENCOUNTER — Encounter: Payer: Self-pay | Admitting: Urology

## 2019-03-05 VITALS — BP 155/73 | HR 81 | Ht 62.0 in | Wt 192.0 lb

## 2019-03-05 DIAGNOSIS — Z8551 Personal history of malignant neoplasm of bladder: Secondary | ICD-10-CM

## 2019-03-05 DIAGNOSIS — N3281 Overactive bladder: Secondary | ICD-10-CM

## 2019-03-05 DIAGNOSIS — N898 Other specified noninflammatory disorders of vagina: Secondary | ICD-10-CM

## 2019-03-05 DIAGNOSIS — N2889 Other specified disorders of kidney and ureter: Secondary | ICD-10-CM
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
## 2019-03-06 LAB — MICROSCOPIC EXAMINATION: RBC, UA: NONE SEEN /hpf (ref 0–2)

## 2019-03-06 LAB — URINALYSIS, COMPLETE
Bilirubin, UA: NEGATIVE
Glucose, UA: NEGATIVE
Ketones, UA: NEGATIVE
NITRITE UA: NEGATIVE
Protein, UA: NEGATIVE
RBC UA: NEGATIVE
Specific Gravity, UA: 1.025 (ref 1.005–1.030)
Urobilinogen, Ur: 0.2 mg/dL (ref 0.2–1.0)
pH, UA: 6 (ref 5.0–7.5)

## 2019-03-09 ENCOUNTER — Other Ambulatory Visit: Payer: Self-pay | Admitting: Family Medicine

## 2019-03-10 NOTE — Telephone Encounter (Signed)
Requested medication (s) are due for refill today: yes  Requested medication (s) are on the active medication list: yes  Last refill:  09/25/18 for 30 and 3 refills  Future visit scheduled: yes  Notes to clinic:  antilipid - Sterol Transport Inhibitors failed  Requested Prescriptions  Pending Prescriptions Disp Refills   ezetimibe (ZETIA) 10 MG tablet [Pharmacy Med Name: EZETIMIBE 10MG  TABLETS] 30 tablet 3    Sig: TAKE 1 TABLET(10 MG) BY MOUTH DAILY     Cardiovascular:  Antilipid - Sterol Transport Inhibitors Failed - 03/09/2019  9:17 PM      Failed - Total Cholesterol in normal range and within 360 days    Cholesterol, Total  Date Value Ref Range Status  09/17/2018 241 (H) 100 - 199 mg/dL Final   Cholesterol Piccolo, Waived  Date Value Ref Range Status  08/14/2016 211 (H) <200 mg/dL Final    Comment:                            Desirable                <200                         Borderline High      200- 239                         High                     >239          Failed - LDL in normal range and within 360 days    LDL Calculated  Date Value Ref Range Status  09/17/2018 157 (H) 0 - 99 mg/dL Final         Failed - Triglycerides in normal range and within 360 days    Triglycerides  Date Value Ref Range Status  09/17/2018 178 (H) 0 - 149 mg/dL Final   Triglycerides Piccolo,Waived  Date Value Ref Range Status  08/14/2016 226 (H) <150 mg/dL Final    Comment:                            Normal                   <150                         Borderline High     150 - 199                         High                200 - 499                         Very High                >499          Passed - HDL in normal range and within 360 days    HDL  Date Value Ref Range Status  09/17/2018 48 >39 mg/dL Final         Passed - Valid encounter within last 12 months    Recent Outpatient Visits  5 months ago Benign hypertensive renal disease   [facility] [place], Megan P, DO   9 months ago Subclinical hypothyroidism   [facility] [place], Megan P, DO   11 months ago Controlled type 2 diabetes mellitus without complication, without long-term current use of insulin [facility])   [place], [place], DO   1 year ago Pre-op exam   [facility] [place], [place]   1 year ago Leg cramps   [place], [place], DO      Future Appointments            In 3 weeks  [facility], [place]   In 3 weeks Wynetta Emery, Barb Merino, DO [employer], [facility]

## 2019-03-18 ENCOUNTER — Telehealth: Payer: Self-pay | Admitting: Obstetrics & Gynecology

## 2019-03-18 NOTE — Telephone Encounter (Signed)
[facility] referring for History of bladder cancer.([place]) Called and left voicemail for patient to call back to be schedule

## 2019-03-21 ENCOUNTER — Ambulatory Visit: Payer: Self-pay

## 2019-03-24 ENCOUNTER — Ambulatory Visit: Payer: Medicare Other | Admitting: Family Medicine

## 2019-03-24 ENCOUNTER — Ambulatory Visit: Payer: Medicare Other

## 2019-03-26 ENCOUNTER — Telehealth: Payer: Self-pay

## 2019-03-26 NOTE — Telephone Encounter (Signed)
Patient scheduled for an AWV on 03/31/2019 with [facility], Due to Covid-19 pandemic this is unable to be done in office, called patient to see if they are able to do this virtually or if it needed to rescheduled for an in office for after June 2020. Patient requested reschedule as she doesn't have access to video capabilities. Informed her someone would call her in regards to her visit with Dr.Johnson on 03/31/2019. Patient verbalized and confirmed new appt for 06/30/2019

## 2019-03-31 ENCOUNTER — Ambulatory Visit: Payer: Medicare Other

## 2019-03-31 ENCOUNTER — Encounter: Payer: Self-pay | Admitting: Family Medicine

## 2019-03-31 ENCOUNTER — Other Ambulatory Visit: Payer: Self-pay

## 2019-03-31 ENCOUNTER — Ambulatory Visit (INDEPENDENT_AMBULATORY_CARE_PROVIDER_SITE_OTHER): Payer: Medicare Other | Admitting: Family Medicine

## 2019-03-31 VITALS — BP 137/79 | HR 67 | Temp 98.2°F | Ht 62.0 in | Wt 194.0 lb

## 2019-03-31 DIAGNOSIS — I739 Peripheral vascular disease, unspecified: Secondary | ICD-10-CM | POA: Diagnosis not present

## 2019-03-31 DIAGNOSIS — B9689 Other specified bacterial agents as the cause of diseases classified elsewhere: Secondary | ICD-10-CM | POA: Diagnosis not present

## 2019-03-31 DIAGNOSIS — F3341 Major depressive disorder, recurrent, in partial remission: Secondary | ICD-10-CM | POA: Diagnosis not present

## 2019-03-31 DIAGNOSIS — E119 Type 2 diabetes mellitus without complications: Secondary | ICD-10-CM

## 2019-03-31 DIAGNOSIS — I129 Hypertensive chronic kidney disease with stage 1 through stage 4 chronic kidney disease, or unspecified chronic kidney disease: Secondary | ICD-10-CM

## 2019-03-31 DIAGNOSIS — N898 Other specified noninflammatory disorders of vagina: Secondary | ICD-10-CM | POA: Diagnosis not present

## 2019-03-31 DIAGNOSIS — E782 Mixed hyperlipidemia: Secondary | ICD-10-CM | POA: Diagnosis not present

## 2019-03-31 DIAGNOSIS — C679 Malignant neoplasm of bladder, unspecified: Secondary | ICD-10-CM | POA: Diagnosis not present

## 2019-03-31 DIAGNOSIS — N760 Acute vaginitis: Secondary | ICD-10-CM | POA: Diagnosis not present

## 2019-03-31 DIAGNOSIS — E039 Hypothyroidism, unspecified: Secondary | ICD-10-CM

## 2019-03-31 LAB — UA/M W/RFLX CULTURE, ROUTINE
Bilirubin, UA: NEGATIVE
Glucose, UA: NEGATIVE
Ketones, UA: NEGATIVE
Leukocytes,UA: NEGATIVE
Nitrite, UA: NEGATIVE
Protein,UA: NEGATIVE
RBC, UA: NEGATIVE
Specific Gravity, UA: 1.025 (ref 1.005–1.030)
Urobilinogen, Ur: 0.2 mg/dL (ref 0.2–1.0)
pH, UA: 5.5 (ref 5.0–7.5)

## 2019-03-31 LAB — MICROALBUMIN, URINE WAIVED
Creatinine, Urine Waived: 200 mg/dL (ref 10–300)
Microalb, Ur Waived: 10 mg/L (ref 0–19)
Microalb/Creat Ratio: <30 mg/g (ref <30)

## 2019-03-31 LAB — WET PREP FOR TRICH, YEAST, CLUE
Clue Cell Exam: POSITIVE — AB
Trichomonas Exam: NEGATIVE
Yeast Exam: NEGATIVE

## 2019-03-31 LAB — BAYER DCA HB A1C WAIVED: HB A1C (BAYER DCA - WAIVED): 7.5 % — ABNORMAL HIGH (ref <7.0)

## 2019-03-31 MED ORDER — EZETIMIBE 10 MG PO TABS: ORAL_TABLET | ORAL | 1 refills | Status: DC

## 2019-03-31 MED ORDER — GABAPENTIN 300 MG PO CAPS: ORAL_CAPSULE | ORAL | 1 refills | Status: DC

## 2019-03-31 MED ORDER — METRONIDAZOLE 500 MG PO TABS: 500.0000 mg | ORAL_TABLET | Freq: Two times a day (BID) | ORAL | 0 refills | Status: DC

## 2019-03-31 MED ORDER — LOSARTAN POTASSIUM 50 MG PO TABS: 50.0000 mg | ORAL_TABLET | Freq: Every day | ORAL | 1 refills | Status: DC

## 2019-03-31 MED ORDER — ESCITALOPRAM OXALATE 10 MG PO TABS: 10.0000 mg | ORAL_TABLET | Freq: Every day | ORAL | 1 refills | Status: DC

## 2019-03-31 MED ORDER — METFORMIN HCL ER 500 MG PO TB24: 500.0000 mg | ORAL_TABLET | Freq: Every day | ORAL | 3 refills | Status: DC

## 2019-03-31 MED ORDER — MIRABEGRON ER 50 MG PO TB24: 50.0000 mg | ORAL_TABLET | Freq: Every day | ORAL | 1 refills | Status: DC

## 2019-03-31 MED ORDER — BACLOFEN 10 MG PO TABS: 10.0000 mg | ORAL_TABLET | Freq: Every day | ORAL | 1 refills | Status: DC

## 2019-03-31 NOTE — Assessment & Plan Note (Signed)
Stable. Follows with urology. Call with any concerns.

## 2019-03-31 NOTE — Assessment & Plan Note (Signed)
Under good control on current regimen. Continue current regimen. Continue to monitor. Call with any concerns. Refills given. Labs drawn today.   

## 2019-03-31 NOTE — Assessment & Plan Note (Signed)
Will keep BP and cholesterol and sugars under good control. Continue to monitor. Call with any concerns.  

## 2019-03-31 NOTE — Progress Notes (Signed)
BP 137/79   Pulse 67   Temp 98.2 F (36.8 C) (Oral)   Ht 5\' 2"  (1.575 m)   Wt 194 lb (88 kg)   SpO2 98%   BMI 35.48 kg/m    Subjective:    Patient ID: Stacy Mercado, female    DOB: 1946/02/28, 73 y.o.   MRN: [id number]  HPI: Stacy Mercado is a 73 y.o. female  Chief Complaint  Patient presents with  . Hypertension  . Hyperlipidemia  . Diabetes  . Hypothyroidism  . Depression  . TELEMEDICINE VISIT  . Vaginal Itching    x few months. OTC unhelpful   HYPERTENSION / HYPERLIPIDEMIA Satisfied with current treatment? yes Duration of hypertension: chronic BP monitoring frequency: not checking BP medication side effects: no Past BP meds: losartan Duration of hyperlipidemia: chronic Cholesterol medication side effects: no Cholesterol supplements: none Past cholesterol medications: zetia Medication compliance: excellent compliance Aspirin: yes Recent stressors: no Recurrent headaches: no Visual changes: no Palpitations: no Dyspnea: no Chest pain: no Lower extremity edema: no Dizzy/lightheaded: no  DIABETES Hypoglycemic episodes:no Polydipsia/polyuria: no Visual disturbance: no Chest pain: no Paresthesias: no Glucose Monitoring: no  Accucheck frequency: Not Checking Taking Insulin?: no Blood Pressure Monitoring: not checking Retinal Examination: Not up to Date Foot Exam: Not up to Date Diabetic Education: Not Completed Pneumovax: Up to Date Influenza: Up to Date Aspirin: yes  HYPOTHYROIDISM Thyroid control status:controlled Satisfied with current treatment? yes Medication side effects: no Medication compliance: excellent compliance Recent dose adjustment:no Fatigue: yes Cold intolerance: yes Heat intolerance: no Weight gain: yes Weight loss: no Constipation: no Diarrhea/loose stools: no Palpitations: no Lower extremity edema: no Anxiety/depressed mood: no  DEPRESSION Mood status: controlled Satisfied with current treatment?: yes  Symptom severity: mild  Duration of current treatment : chronic Side effects: no Medication compliance: excellent compliance Psychotherapy/counseling: no  Previous psychiatric medications: lexapro Depressed mood: no Anxious mood: no Anhedonia: no Significant weight loss or gain: no Insomnia: no  Fatigue: no Feelings of worthlessness or guilt: no Impaired concentration/indecisiveness: no Suicidal ideations: no Hopelessness: no Crying spells: no Depression screen [facility] 2/9 03/31/2019 09/17/2018 03/18/2018 03/13/2017 10/18/2016  Decreased Interest 0 0 0 0 0  Down, Depressed, Hopeless 0 0 0 1 0  PHQ - 2 Score 0 0 0 1 0  Altered sleeping 0 0 0 - -  Tired, decreased energy 0 0 0 - -  Change in appetite 0 0 0 - -  Feeling bad or failure about yourself  0 0 0 - -  Trouble concentrating 0 0 0 - -  Moving slowly or fidgety/restless 0 0 0 - -  Suicidal thoughts 0 0 0 - -  PHQ-9 Score 0 0 0 - -  Difficult doing work/chores Not difficult at all Not difficult at all Not difficult at all - -   VAGINAL DISCHARGE Duration: weeks Discharge description: clear  Pruritus: yes Dysuria: no Malodorous: no Urinary frequency: yes Fevers: no Abdominal pain: no  History of sexually transmitted diseases: no Recent antibiotic use: no Context: none  Treatments attempted: antifungal   Relevant past medical, surgical, family and social history reviewed and updated as indicated. Interim medical history since our last visit reviewed. Allergies and medications reviewed and updated.  Review of Systems  Constitutional: Negative.   HENT: Negative.   Respiratory: Negative.   Cardiovascular: Negative.   Gastrointestinal: Negative.   Genitourinary: Positive for vaginal discharge. Negative for decreased urine volume, difficulty urinating, dyspareunia, dysuria, enuresis, flank pain, frequency, genital sores,  hematuria, menstrual problem, pelvic pain, urgency, vaginal bleeding and vaginal pain.  Musculoskeletal:  Negative.   Neurological: Negative.   Psychiatric/Behavioral: Negative.     Per HPI unless specifically indicated above     Objective:    BP 137/79   Pulse 67   Temp 98.2 F (36.8 C) (Oral)   Ht 5\' 2"  (1.575 m)   Wt 194 lb (88 kg)   SpO2 98%   BMI 35.48 kg/m   Wt Readings from Last 3 Encounters:  03/31/19 194 lb (88 kg)  03/05/19 192 lb (87.1 kg)  09/17/18 183 lb (83 kg)    Physical Exam Vitals signs and nursing note reviewed.  Pulmonary:     Effort: Pulmonary effort is normal. No respiratory distress.     Comments: Speaking in full sentences Neurological:     Mental Status: She is alert.  Psychiatric:        Mood and Affect: Mood normal.        Behavior: Behavior normal.        Thought Content: Thought content normal.        Judgment: Judgment normal.     Results for orders placed or performed in visit on 03/05/19  Microscopic Examination  Result Value Ref Range   WBC, UA 0-5 0 - 5 /hpf   RBC, UA None seen 0 - 2 /hpf   Epithelial Cells (non renal) 0-10 0 - 10 /hpf   Renal Epithel, UA 0-10 (A) None seen /hpf   Mucus, UA Present Not Estab.   Bacteria, UA Moderate (A) None seen/Few  Urinalysis, Complete  Result Value Ref Range   Specific Gravity, UA 1.025 1.005 - 1.030   pH, UA 6.0 5.0 - 7.5   Color, UA Yellow Yellow   Appearance Ur Clear Clear   Leukocytes, UA Trace (A) Negative   Protein, UA Negative Negative/Trace   Glucose, UA Negative Negative   Ketones, UA Negative Negative   RBC, UA Negative Negative   Bilirubin, UA Negative Negative   Urobilinogen, Ur 0.2 0.2 - 1.0 mg/dL   Nitrite, UA Negative Negative   Microscopic Examination See below:       Assessment & Plan:   Problem List Items Addressed This Visit      Cardiovascular and Mediastinum   PVD (peripheral vascular disease) ([place]) - Primary    Will keep BP and cholesterol and sugars under good control. Continue to monitor. Call with any concerns.       Relevant Medications   ezetimibe  (ZETIA) 10 MG tablet   losartan (COZAAR) 50 MG tablet   Other Relevant Orders   CBC with Differential/Platelet   Comprehensive metabolic panel   UA/M w/rflx Culture, Routine     Endocrine   Controlled type 2 diabetes mellitus ([place])    Going in the wrong direction with A1c of 7.5 up from 5.7- will restart her metformin and recheck 3 months. Call with any concerns.       Relevant Medications   losartan (COZAAR) 50 MG tablet   metFORMIN (GLUCOPHAGE-XR) 500 MG 24 hr tablet   Other Relevant Orders   Bayer DCA Hb A1c Waived   CBC with Differential/Platelet   Comprehensive metabolic panel   Microalbumin, Urine Waived   UA/M w/rflx Culture, Routine   Hypothyroidism    Will recheck levels and adjust as needed. Treat as needed.       Relevant Orders   CBC with Differential/Platelet   Comprehensive metabolic panel   TSH  UA/M w/rflx Culture, Routine     Genitourinary   Bladder cancer ([place])    Stable. Follows with urology. Call with any concerns.      Relevant Medications   metroNIDAZOLE (FLAGYL) 500 MG tablet   Other Relevant Orders   CBC with Differential/Platelet   Comprehensive metabolic panel   UA/M w/rflx Culture, Routine   Benign hypertensive renal disease    Under good control on current regimen. Continue current regimen. Continue to monitor. Call with any concerns. Refills given. Labs drawn today.       Relevant Orders   CBC with Differential/Platelet   Comprehensive metabolic panel   Microalbumin, Urine Waived   UA/M w/rflx Culture, Routine     Other   Depression    Under good control on current regimen. Continue current regimen. Continue to monitor. Call with any concerns. Refills given. Labs drawn today.      Relevant Medications   escitalopram (LEXAPRO) 10 MG tablet   Other Relevant Orders   CBC with Differential/Platelet   Comprehensive metabolic panel   UA/M w/rflx Culture, Routine   Hyperlipidemia    Under good control on current regimen. Continue  current regimen. Continue to monitor. Call with any concerns. Refills given. Labs drawn today.       Relevant Medications   ezetimibe (ZETIA) 10 MG tablet   losartan (COZAAR) 50 MG tablet   Other Relevant Orders   CBC with Differential/Platelet   Comprehensive metabolic panel   Lipid Panel w/o Chol/HDL Ratio   UA/M w/rflx Culture, Routine    Other Visit Diagnoses    Vaginal discharge       +Clue Cells- will treat with flagyl.   Relevant Orders   WET PREP FOR [place], YEAST, CLUE   BV (bacterial vaginosis)       Will treat with metronidazole. Call if not getting better or getting worse.    Relevant Medications   metroNIDAZOLE (FLAGYL) 500 MG tablet       Follow up plan: Return in about 3 months (around 06/30/2019) for Diabetes follow up.   . This visit was completed via telephone due to the restrictions of the COVID-19 pandemic. All issues as above were discussed and addressed but no physical exam was performed. If it was felt that the patient should be evaluated in the office, they were directed there. The patient verbally consented to this visit. Patient was unable to complete an audio/visual visit due to Lack of equipment. Due to the catastrophic nature of the COVID-19 pandemic, this visit was done through audio contact only. . Location of the patient: parking lot . Location of the provider: home . Those involved with this call:  . Provider: Park Liter, DO . CMA: Gerda Diss, CMA . Front Desk/Registration: Jill Side  . Time spent on call: 25 minutes with patient face to face via video conference. More than 50% of this time was spent in counseling and coordination of care. 40 minutes total spent in review of patient's record and preparation of their chart.

## 2019-03-31 NOTE — Assessment & Plan Note (Addendum)
Going in the wrong direction with A1c of 7.5 up from 5.7- will restart her metformin and recheck 3 months. Call with any concerns.

## 2019-03-31 NOTE — Assessment & Plan Note (Signed)
Will recheck levels and adjust as needed. Treat as needed.

## 2019-04-01 ENCOUNTER — Telehealth: Payer: Self-pay

## 2019-04-01 LAB — CBC WITH DIFFERENTIAL/PLATELET
Basophils Absolute: 0.1 10*3/uL (ref 0.0–0.2)
Basos: 1 %
EOS (ABSOLUTE): 0.2 10*3/uL (ref 0.0–0.4)
Eos: 2 %
Hematocrit: 36.2 % (ref 34.0–46.6)
Hemoglobin: 12.2 g/dL (ref 11.1–15.9)
Immature Grans (Abs): 0 10*3/uL (ref 0.0–0.1)
Immature Granulocytes: 0 %
Lymphocytes Absolute: 2.1 10*3/uL (ref 0.7–3.1)
Lymphs: 32 %
MCH: 29.4 pg (ref 26.6–33.0)
MCHC: 33.7 g/dL (ref 31.5–35.7)
MCV: 87 fL (ref 79–97)
Monocytes Absolute: 0.8 10*3/uL (ref 0.1–0.9)
Monocytes: 12 %
Neutrophils Absolute: 3.5 10*3/uL (ref 1.4–7.0)
Neutrophils: 53 %
Platelets: 181 10*3/uL (ref 150–450)
RBC: 4.15 x10E6/uL (ref 3.77–5.28)
RDW: 13.3 % (ref 11.7–15.4)
WBC: 6.6 10*3/uL (ref 3.4–10.8)

## 2019-04-01 LAB — LIPID PANEL W/O CHOL/HDL RATIO
Cholesterol, Total: 207 mg/dL — ABNORMAL HIGH (ref 100–199)
HDL: 39 mg/dL — ABNORMAL LOW (ref >39)
LDL Calculated: 130 mg/dL — ABNORMAL HIGH (ref 0–99)
Triglycerides: 191 mg/dL — ABNORMAL HIGH (ref 0–149)
VLDL Cholesterol Cal: 38 mg/dL (ref 5–40)

## 2019-04-01 LAB — COMPREHENSIVE METABOLIC PANEL
ALT: 16 IU/L (ref 0–32)
AST: 13 IU/L (ref 0–40)
Albumin/Globulin Ratio: 2 (ref 1.2–2.2)
Albumin: 4.2 g/dL (ref 3.7–4.7)
Alkaline Phosphatase: 101 IU/L (ref 39–117)
BUN/Creatinine Ratio: 21 (ref 12–28)
BUN: 15 mg/dL (ref 8–27)
Bilirubin Total: 0.3 mg/dL (ref 0.0–1.2)
CO2: 22 mmol/L (ref 20–29)
Calcium: 10.3 mg/dL (ref 8.7–10.3)
Chloride: 105 mmol/L (ref 96–106)
Creatinine, Ser: 0.72 mg/dL (ref 0.57–1.00)
GFR calc Af Amer: 95 mL/min/{1.73_m2} (ref >59)
GFR calc non Af Amer: 83 mL/min/{1.73_m2} (ref >59)
Globulin, Total: 2.1 g/dL (ref 1.5–4.5)
Glucose: 151 mg/dL — ABNORMAL HIGH (ref 65–99)
Potassium: 4.1 mmol/L (ref 3.5–5.2)
Sodium: 141 mmol/L (ref 134–144)
Total Protein: 6.3 g/dL (ref 6.0–8.5)

## 2019-04-01 LAB — TSH: TSH: 3.24 u[IU]/mL (ref 0.450–4.500)

## 2019-04-01 NOTE — Telephone Encounter (Signed)
She can check with her insurance to see if there is something cheaper.

## 2019-04-01 NOTE — Telephone Encounter (Signed)
Received a fax from [facility] regarding patient's [name] prescription. They state that it is $399 for a 30 day supply and $555 for a 90 day supply. Patient would like something cheaper. Please advise.

## 2019-04-01 NOTE — Telephone Encounter (Signed)
Patient returned my call regarding [name] (see previous telephone encounter). She states that she was already aware that the prescription was to expensive for her. She states that she thoguht Dr. Wynetta Emery was going to change her thyroid medication because it was not working. Patient also wants to know if she is still supposed to be taking naproxen. She said if so, she needs a new RX sent to the pharmacy.

## 2019-04-01 NOTE — Telephone Encounter (Signed)
Called and left patient a VM letting her know what Dr. Wynetta Emery said.

## 2019-04-02 ENCOUNTER — Other Ambulatory Visit: Payer: Self-pay | Admitting: Family Medicine

## 2019-04-02 ENCOUNTER — Encounter: Payer: Self-pay | Admitting: Family Medicine

## 2019-04-02 MED ORDER — LEVOTHYROXINE SODIUM 50 MCG PO TABS: 50.0000 ug | ORAL_TABLET | Freq: Every day | ORAL | 3 refills | Status: DC

## 2019-04-02 MED ORDER — NAPROXEN 500 MG PO TABS: 500.0000 mg | ORAL_TABLET | Freq: Two times a day (BID) | ORAL | 6 refills | Status: DC

## 2019-04-02 NOTE — Telephone Encounter (Signed)
Letter generated and sent. Naproxen sent to her pharmacy

## 2019-04-09 ENCOUNTER — Other Ambulatory Visit (HOSPITAL_COMMUNITY)
Admission: RE | Admit: 2019-04-09 | Discharge: 2019-04-09 | Disposition: A | Discharge status: Home / Self Care | Payer: Medicare Other | Source: Ambulatory Visit | Attending: Obstetrics & Gynecology | Admitting: Obstetrics & Gynecology

## 2019-04-09 ENCOUNTER — Encounter: Payer: Self-pay | Admitting: Obstetrics & Gynecology

## 2019-04-09 ENCOUNTER — Ambulatory Visit (INDEPENDENT_AMBULATORY_CARE_PROVIDER_SITE_OTHER): Payer: Medicare Other | Admitting: Obstetrics & Gynecology

## 2019-04-09 ENCOUNTER — Other Ambulatory Visit: Payer: Self-pay

## 2019-04-09 VITALS — BP 122/80 | Ht 65.0 in | Wt 195.0 lb

## 2019-04-09 DIAGNOSIS — L900 Lichen sclerosus et atrophicus: Secondary | ICD-10-CM | POA: Insufficient documentation

## 2019-04-09 DIAGNOSIS — N9089 Other specified noninflammatory disorders of vulva and perineum: Secondary | ICD-10-CM | POA: Insufficient documentation

## 2019-04-09 NOTE — Patient Instructions (Signed)
Lichen Sclerosus  Lichen sclerosus is a skin problem. It can happen on any part of the body, but it commonly involves the anal or genital areas. It can cause itching and discomfort in these areas. Treatment can help to control symptoms. When the genital area is affected, getting treatment is important because the condition can cause scarring that may lead to other problems.  What are the causes?  The cause of this condition is not known. It may be related to an overactive immune system or a lack of certain hormones. Lichen sclerosus is not an infection or a fungus, and it is not passed from one person to another (not contagious).  What increases the risk?  This condition is more likely to develop in women, usually after menopause.  What are the signs or symptoms?  Symptoms of this condition include:   Thin, wrinkled, white areas on the skin.   Thickened white areas on the skin.   Red and swollen patches (lesions) on the skin.   Tears or cracks in the skin.   Bruising.   Blood blisters.   Severe itching.   Pain, itching, or burning when urinating.  Constipation is also common in people with lichen sclerosus.  How is this diagnosed?  This condition may be diagnosed with a physical exam. In some cases, a tissue sample (biopsy sample) may be removed to be looked at under a microscope.  How is this treated?  This condition is usually treated with medicated creams or ointments (topical steroids) that are applied over the affected areas. In some cases, treatment may also include medicines that are taken by mouth. Surgery may be needed in more severe cases that are causing problems such as scarring.  Follow these instructions at home:   Take or use over-the-counter and prescription medicines only as told by your health care provider.   Use creams or ointments as told by your health care provider.   Do not scratch the affected areas of skin.   If you are a woman, be sure to keep the vaginal area as clean and dry  as possible.   Clean the affected area of skin gently with water. Avoid using rough towels or toilet paper.   Keep all follow-up visits as told by your health care provider. This is important.  Contact a health care provider if:   You have increasing redness, swelling, or pain in the affected area.   You have fluid, blood, or pus coming from the affected area.   You have new lesions on your skin.   You have a fever.   You have pain during sex.  Summary   Lichen sclerosus is a skin problem. When the genital area is affected, getting treatment is important because the condition can cause scarring that may lead to other problems.   This condition is usually treated with medicated creams or ointments (topical steroids) that are applied over the affected areas.   Take or use over-the-counter and prescription medicines only as told by your health care provider.   Contact a health care provider if you have new lesions on your skin, have pain during sex, or have increasing redness, swelling, or pain in the affected area.   Keep all follow-up visits as told by your health care provider. This is important.  This information is not intended to replace advice given to you by your health care provider. Make sure you discuss any questions you have with your health care provider.  Document Released:   04/26/2011 Document Revised: 04/18/2018 Document Reviewed: 04/18/2018  Elsevier Interactive Patient Education  2019 Elsevier Inc.

## 2019-04-09 NOTE — Progress Notes (Signed)
Consultation- Vulvar changes Consultant- Dr Erlene Quan  HPI:      Stacy Mercado is a 73 y.o. [phone], menopausal and patient has had a hysterectomy., presents today for a problem visit.  Stacy Mercado complains of:  Vulvar concern:   This is a 73 y.o. old Caucasian/White female who presents for the evaluation of vulvar skin changes as noted by Stacy Mercado urologist during exam.  Stacy Mercado has recovered from bladder cancer treatment that was dx 3 years ago.  Stacy Mercado is not sexually active.  Stacy Mercado notes mild vaginal and vulvar irritation and itching at times.  Stacy Mercado indicates Stacy Mercado first noticed the problem many months ago. Stacy Mercado admits to symptoms of itching and irritation.  The following aggravating factors are identified: none. The following alleviating factors are identified: none.  Shehas had no previous colposcopy for this condition. The lesion has had no been biopsied. Stacy Mercado has had no previous treatment for this condition.   PMHx: Stacy Mercado  has a past medical history of Arthritis, Bladder cancer ([place]), Chronic low back pain, Depression, Diabetes mellitus without complication ([place]), Hyperlipidemia (08/07/2016), Hypertension, PVD (peripheral vascular disease) ([place]), and Urge incontinence of urine (08/07/2016). Also,  has a past surgical history that includes Appendectomy (1961); Dilation and curettage of uterus (1999); complete hysterectomy (1999); L rotator cuff (Left, 2010); R thumb ruptured ligament and tendon (2012); bladder cancer removed (2014); Shoulder arthroscopy with open rotator cuff repair (Right, 01/30/2017); Shoulder arthroscopy with bicepstenotomy (Right, 01/30/2017); Abdominal hysterectomy; Cataract extraction w/ intraocular lens  implant, bilateral; and Reverse shoulder arthroplasty (Right, 11/20/2017)., family history includes Diabetes in Stacy Mercado brother; Healthy in Stacy Mercado daughter, son, and son; Hypertension in Stacy Mercado brother.,  reports that Stacy Mercado quit smoking about 12 years ago. Stacy Mercado smoking use included cigarettes. Stacy Mercado quit after  50.00 years of use. Stacy Mercado has never used smokeless tobacco. Stacy Mercado reports current alcohol use of about 7.0 standard drinks of alcohol per week. Stacy Mercado reports that Stacy Mercado does not use drugs.  Stacy Mercado has a current medication list which includes the following prescription(s): acetaminophen, aspirin, baclofen, ciclopirox, escitalopram, ezetimibe, gabapentin, levothyroxine, lidocaine-menthol, losartan, metformin, metronidazole, mirabegron er, multivitamin with minerals, and naproxen. Also, is allergic to statins.  Review of Systems  Constitutional: Negative for chills, fever and malaise/fatigue.  HENT: Negative for congestion, sinus pain and sore throat.   Eyes: Negative for blurred vision and pain.  Respiratory: Negative for cough and wheezing.   Cardiovascular: Negative for chest pain and leg swelling.  Gastrointestinal: Negative for abdominal pain, constipation, diarrhea, heartburn, nausea and vomiting.  Genitourinary: Negative for dysuria, frequency, hematuria and urgency.  Musculoskeletal: Negative for back pain, joint pain, myalgias and neck pain.  Skin: Negative for itching and rash.  Neurological: Negative for dizziness, tremors and weakness.  Endo/Heme/Allergies: Does not bruise/bleed easily.  Psychiatric/Behavioral: Negative for depression. The patient is not nervous/anxious and does not have insomnia.    Objective: BP 122/80    Ht 5\' 5"  (1.651 m)    Wt 195 lb (88.5 kg)    BMI 32.45 kg/m  Physical Exam Constitutional:      General: Stacy Mercado is not in acute distress.    Appearance: Stacy Mercado is well-developed.  Genitourinary:     Pelvic exam was performed with patient supine.     Vagina normal.        No vaginal erythema or bleeding.     Genitourinary Comments: Noted white pigment skin changes throughout labia minora majora and perianally  Cuff intact/ no lesions Absent uterus and cervix  HENT:  Head: Normocephalic and atraumatic.     Nose: Nose normal.  Abdominal:     General: There is no  distension.     Palpations: Abdomen is soft.     Tenderness: There is no abdominal tenderness.  Musculoskeletal: Normal range of motion.  Neurological:     Mental Status: Stacy Mercado is alert and oriented to person, place, and time.     Cranial Nerves: No cranial nerve deficit.  Skin:    General: Skin is warm and dry.     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 Also to consider VIN, atrophic changes, cancer   Relevant Orders   Surgical pathology of biopsy   Vulvar irritation     VULVAR BIOPSY NOTE The indications for vulvar biopsy (rule out neoplasia, establish lichen sclerosus diagnosis) were reviewed.   Risks of the biopsy including pain, bleeding, infection, inadequate specimen, scarring and need for additional procedures  were discussed. The patient stated understanding and agreed to undergo procedure today.   The patient's vulva was prepped with Betadine. 1% lidocaine was injected into area of concern. A 3 -mm punch biopsy was done, biopsy tissue was picked up with sterile forceps and sterile scissors were used to excise the lesion.  Small bleeding was noted and hemostasis was achieved using silver nitrate sticks.  The patient tolerated the procedure well. Post-procedure instructions  (pelvic rest for one week) were given to the patient. The patient is to call with heavy bleeding, fever greater than 100.4, foul smelling vaginal discharge or other concerns.    Barnett Applebaum, MD, [facility], [facility] 04/09/2019  11:17 AM

## 2019-04-14 ENCOUNTER — Encounter: Payer: Self-pay | Admitting: Urology

## 2019-04-14 ENCOUNTER — Other Ambulatory Visit: Payer: Self-pay

## 2019-04-15 ENCOUNTER — Other Ambulatory Visit: Payer: Self-pay

## 2019-04-15 ENCOUNTER — Other Ambulatory Visit: Payer: Self-pay | Admitting: Obstetrics & Gynecology

## 2019-04-15 ENCOUNTER — Telehealth: Payer: Self-pay | Admitting: Obstetrics & Gynecology

## 2019-04-15 DIAGNOSIS — N3281 Overactive bladder: Secondary | ICD-10-CM

## 2019-04-15 MED ORDER — CLOBETASOL PROPIONATE 0.05 % EX CREA: 1.0000 "application " | TOPICAL_CREAM | Freq: Two times a day (BID) | CUTANEOUS | 2 refills | Status: DC

## 2019-04-15 MED ORDER — OXYBUTYNIN CHLORIDE ER 10 MG PO TB24: 10.0000 mg | ORAL_TABLET | Freq: Every day | ORAL | 3 refills | Status: DC

## 2019-04-15 NOTE — Telephone Encounter (Signed)
Patient is calling about her Lab results patient has questions. Please advise

## 2019-04-15 NOTE — Telephone Encounter (Signed)
Pt called back. Rx at pharmacy is $110. Is there something else she can have instead? LM for pt that she can get it much cheaper using GoodRx coupon  ($28.45) at [facility]. Asked pt to call back.

## 2019-04-15 NOTE — Telephone Encounter (Signed)
See my chart message

## 2019-04-15 NOTE — Progress Notes (Signed)
Lichen Sclerosus by biopsy Plan treatement with Clobetasol LM to discuss w pt

## 2019-04-16 NOTE — Telephone Encounter (Signed)
LMVM for pt to return call to let us know which Stacy Mercado to resend Chlobetasol rx in to. Advised to show [place] she received by text to get the discount [facility] discussed with her.

## 2019-04-16 NOTE — Telephone Encounter (Signed)
Pt calling regarding a rx to [place] that she received a "text"? Coupon for. She apologizes if she's calling too soon & this hasn't been taken care of yet. CN#[phone]

## 2019-04-17 ENCOUNTER — Telehealth: Payer: Self-pay

## 2019-04-17 NOTE — Telephone Encounter (Signed)
Pt called triage to change her pharmacy to [facility]

## 2019-04-18 ENCOUNTER — Other Ambulatory Visit: Payer: Self-pay

## 2019-04-18 MED ORDER — CLOBETASOL PROPIONATE 0.05 % EX CREA: 1.0000 "application " | TOPICAL_CREAM | Freq: Two times a day (BID) | CUTANEOUS | 2 refills | Status: DC

## 2019-06-09 ENCOUNTER — Other Ambulatory Visit: Payer: Self-pay | Admitting: Family Medicine

## 2019-06-09 DIAGNOSIS — Z1231 Encounter for screening mammogram for malignant neoplasm of breast: Secondary | ICD-10-CM

## 2019-06-29 NOTE — Progress Notes (Deleted)
There were no vitals taken for this visit.   Subjective:    Patient ID: Stacy Mercado, female    DOB: [birth year], 73 y.o.   MRN: [id number]  HPI: Stacy Mercado is a 74 y.o. female  No chief complaint on file.  DIABETES Hypoglycemic episodes:{Blank single:19197::"yes","no"} Polydipsia/polyuria: {Blank single:19197::"yes","no"} Visual disturbance: {Blank single:19197::"yes","no"} Chest pain: {Blank single:19197::"yes","no"} Paresthesias: {Blank single:19197::"yes","no"} Glucose Monitoring: {Blank single:19197::"yes","no"}  Accucheck frequency: {Blank single:19197::"Not Checking","Daily","BID","TID"}  Fasting glucose:  Post prandial:  Evening:  Before meals: Taking Insulin?: {Blank single:19197::"yes","no"}  Long acting insulin:  Short acting insulin: Blood Pressure Monitoring: {Blank single:19197::"not checking","rarely","daily","weekly","monthly","a few times a day","a few times a week","a few times a month"} Retinal Examination: {Blank single:19197::"Up to Date","Not up to Date"} Foot Exam: {Blank single:19197::"Up to Date","Not up to Date"} Diabetic Education: {Blank single:19197::"Completed","Not Completed"} Pneumovax: {Blank single:19197::"Up to Date","Not up to Date","unknown"} Influenza: {Blank single:19197::"Up to Date","Not up to Date","unknown"} Aspirin: {Blank single:19197::"yes","no"}   Relevant past medical, surgical, family and social history reviewed and updated as indicated. Interim medical history since our last visit reviewed. Allergies and medications reviewed and updated.  Review of Systems  Per HPI unless specifically indicated above     Objective:    There were no vitals taken for this visit.  Wt Readings from Last 3 Encounters:  04/09/19 195 lb (88.5 kg)  03/31/19 194 lb (88 kg)  03/05/19 192 lb (87.1 kg)    Physical Exam  Results for orders placed or performed in visit on 03/31/19  WET PREP FOR [place], YEAST, CLUE   Specimen:  Vaginal Fluid   VAGINAL FLUI  Result Value Ref Range   Trichomonas Exam Negative Negative   Yeast Exam Negative Negative   Clue Cell Exam Positive (A) Negative  Bayer DCA Hb A1c Waived  Result Value Ref Range   HB A1C (BAYER DCA - WAIVED) 7.5 (H) <7.0 %  CBC with Differential/Platelet  Result Value Ref Range   WBC 6.6 3.4 - 10.8 x10E3/uL   RBC 4.15 3.77 - 5.28 x10E6/uL   Hemoglobin 12.2 11.1 - 15.9 g/dL   Hematocrit 36.2 34.0 - 46.6 %   MCV 87 79 - 97 fL   MCH 29.4 26.6 - 33.0 pg   MCHC 33.7 31.5 - 35.7 g/dL   RDW 13.3 11.7 - 15.4 %   Platelets 181 150 - 450 x10E3/uL   Neutrophils 53 Not Estab. %   Lymphs 32 Not Estab. %   Monocytes 12 Not Estab. %   Eos 2 Not Estab. %   Basos 1 Not Estab. %   Neutrophils Absolute 3.5 1.4 - 7.0 x10E3/uL   Lymphocytes Absolute 2.1 0.7 - 3.1 x10E3/uL   Monocytes Absolute 0.8 0.1 - 0.9 x10E3/uL   EOS (ABSOLUTE) 0.2 0.0 - 0.4 x10E3/uL   Basophils Absolute 0.1 0.0 - 0.2 x10E3/uL   Immature Granulocytes 0 Not Estab. %   Immature Grans (Abs) 0.0 0.0 - 0.1 x10E3/uL  Comprehensive metabolic panel  Result Value Ref Range   Glucose 151 (H) 65 - 99 mg/dL   BUN 15 8 - 27 mg/dL   Creatinine, Ser 0.72 0.57 - 1.00 mg/dL   GFR calc non Af Amer 83 >59 mL/min/1.73   GFR calc Af Amer 95 >59 mL/min/1.73   BUN/Creatinine Ratio 21 12 - 28   Sodium 141 134 - 144 mmol/L   Potassium 4.1 3.5 - 5.2 mmol/L   Chloride 105 96 - 106 mmol/L   CO2 22 20 - 29 mmol/L   Calcium 10.3 8.7 -  10.3 mg/dL   Total Protein 6.3 6.0 - 8.5 g/dL   Albumin 4.2 3.7 - 4.7 g/dL   Globulin, Total 2.1 1.5 - 4.5 g/dL   Albumin/Globulin Ratio 2.0 1.2 - 2.2   Bilirubin Total 0.3 0.0 - 1.2 mg/dL   Alkaline Phosphatase 101 39 - 117 IU/L   AST 13 0 - 40 IU/L   ALT 16 0 - 32 IU/L  Lipid Panel w/o Chol/HDL Ratio  Result Value Ref Range   Cholesterol, Total 207 (H) 100 - 199 mg/dL   Triglycerides 191 (H) 0 - 149 mg/dL   HDL 39 (L) >39 mg/dL   VLDL Cholesterol Cal 38 5 - 40 mg/dL   LDL  Calculated 130 (H) 0 - 99 mg/dL  Microalbumin, Urine Waived  Result Value Ref Range   Microalb, Ur Waived 10 0 - 19 mg/L   Creatinine, Urine Waived 200 10 - 300 mg/dL   Microalb/Creat Ratio <30 <30 mg/g  TSH  Result Value Ref Range   TSH 3.240 0.450 - 4.500 uIU/mL  UA/M w/rflx Culture, Routine   Specimen: Urine   URINE  Result Value Ref Range   Specific Gravity, UA 1.025 1.005 - 1.030   pH, UA 5.5 5.0 - 7.5   Color, UA Yellow Yellow   Appearance Ur Clear Clear   Leukocytes,UA Negative Negative   Protein,UA Negative Negative/Trace   Glucose, UA Negative Negative   Ketones, UA Negative Negative   RBC, UA Negative Negative   Bilirubin, UA Negative Negative   Urobilinogen, Ur 0.2 0.2 - 1.0 mg/dL   Nitrite, UA Negative Negative      Assessment & Plan:   Problem List Items Addressed This Visit    None       Follow up plan: No follow-ups on file.

## 2019-06-30 ENCOUNTER — Other Ambulatory Visit: Payer: Self-pay

## 2019-06-30 ENCOUNTER — Ambulatory Visit: Payer: Medicare Other

## 2019-06-30 ENCOUNTER — Encounter: Payer: Self-pay | Admitting: Urology

## 2019-06-30 ENCOUNTER — Ambulatory Visit (INDEPENDENT_AMBULATORY_CARE_PROVIDER_SITE_OTHER): Payer: Medicare Other | Admitting: Family Medicine

## 2019-06-30 DIAGNOSIS — Z5321 Procedure and treatment not carried out due to patient leaving prior to being seen by health care provider: Secondary | ICD-10-CM

## 2019-06-30 NOTE — Progress Notes (Addendum)
Patient came into office today. Refused to wear mask or face shield due to severe claustrophobia. Advised her that we could not see her if she would not wear them. She declined to be seen and left without being seen.

## 2019-07-01 ENCOUNTER — Encounter: Payer: Self-pay | Admitting: Family Medicine

## 2019-07-16 ENCOUNTER — Ambulatory Visit: Payer: Medicare Other

## 2019-07-17 ENCOUNTER — Ambulatory Visit: Payer: Medicare Other

## 2019-08-03 ENCOUNTER — Other Ambulatory Visit: Payer: Self-pay | Admitting: Family Medicine

## 2019-08-05 ENCOUNTER — Other Ambulatory Visit: Payer: Self-pay | Admitting: Urology

## 2019-08-05 DIAGNOSIS — N3281 Overactive bladder: Secondary | ICD-10-CM

## 2019-08-07 DIAGNOSIS — Z23 Encounter for immunization: Secondary | ICD-10-CM | POA: Diagnosis not present

## 2019-09-26 ENCOUNTER — Other Ambulatory Visit: Payer: Self-pay | Admitting: Family Medicine

## 2019-09-27 ENCOUNTER — Other Ambulatory Visit: Payer: Self-pay | Admitting: Family Medicine

## 2019-09-27 NOTE — Telephone Encounter (Signed)
Requested medication (s) are due for refill today: yes  Requested medication (s) are on the active medication list: yes  Last refill:  03/31/2019  Future visit scheduled: no  Notes to clinic:  antilipid - sterol transport inhibitors failed. Lipid labs abnormal.  Requested Prescriptions  Pending Prescriptions Disp Refills   ezetimibe (ZETIA) 10 MG tablet [Pharmacy Med Name: EZETIMIBE 10MG  TABLETS] 90 tablet 1    Sig: TAKE 1 TABLET(10 MG) BY MOUTH DAILY     Cardiovascular:  Antilipid - Sterol Transport Inhibitors Failed - 09/27/2019  7:24 AM      Failed - Total Cholesterol in normal range and within 360 days    Cholesterol, Total  Date Value Ref Range Status  03/31/2019 207 (H) 100 - 199 mg/dL Final   Cholesterol Piccolo, Waived  Date Value Ref Range Status  08/14/2016 211 (H) <200 mg/dL Final    Comment:                            Desirable                <200                         Borderline High      200- 239                         High                     >239          Failed - LDL in normal range and within 360 days    LDL Calculated  Date Value Ref Range Status  03/31/2019 130 (H) 0 - 99 mg/dL Final         Failed - HDL in normal range and within 360 days    HDL  Date Value Ref Range Status  03/31/2019 39 (L) >39 mg/dL Final         Failed - Triglycerides in normal range and within 360 days    Triglycerides  Date Value Ref Range Status  03/31/2019 191 (H) 0 - 149 mg/dL Final   Triglycerides Piccolo,Waived  Date Value Ref Range Status  08/14/2016 226 (H) <150 mg/dL Final    Comment:                            Normal                   <150                         Borderline High     150 - 199                         High                200 - 499                         Very High                >499          Passed - Valid encounter within last 12 months    Recent Outpatient Visits  2 months ago Patient left without being seen   [facility], Megan P, DO   6 months ago PVD (peripheral vascular disease) [facility])   [place], [place], DO   1 year ago Benign hypertensive renal disease   [facility] [place], [place], DO   1 year ago Subclinical hypothyroidism   [facility] [place], Megan P, DO   1 year ago Controlled type 2 diabetes mellitus without complication, without long-term current use of insulin ([place])   [place], [place], DO

## 2019-10-07 ENCOUNTER — Other Ambulatory Visit: Payer: Self-pay | Admitting: Family Medicine

## 2019-11-03 ENCOUNTER — Other Ambulatory Visit: Payer: Self-pay | Admitting: Urology

## 2019-11-03 DIAGNOSIS — N3281 Overactive bladder: Secondary | ICD-10-CM

## 2019-11-04 ENCOUNTER — Other Ambulatory Visit: Payer: Self-pay | Admitting: Family Medicine

## 2019-11-26 ENCOUNTER — Ambulatory Visit
Admission: RE | Admit: 2019-11-26 | Discharge: 2019-11-26 | Disposition: A | Discharge status: Home / Self Care | Payer: Medicare Other | Source: Ambulatory Visit | Attending: Family Medicine | Admitting: Family Medicine

## 2019-11-26 DIAGNOSIS — Z1231 Encounter for screening mammogram for malignant neoplasm of breast: Secondary | ICD-10-CM | POA: Diagnosis not present

## 2019-11-26 HISTORY — DX: Personal history of antineoplastic chemotherapy: Z92.21

## 2019-12-01 ENCOUNTER — Other Ambulatory Visit: Payer: Self-pay | Admitting: Family Medicine

## 2019-12-02 ENCOUNTER — Other Ambulatory Visit: Payer: Self-pay | Admitting: Family Medicine

## 2019-12-02 NOTE — Telephone Encounter (Signed)
Courtesy refill until appointment  

## 2019-12-25 ENCOUNTER — Encounter: Payer: Self-pay | Admitting: Family Medicine

## 2020-01-02 ENCOUNTER — Other Ambulatory Visit: Payer: Self-pay

## 2020-01-02 ENCOUNTER — Ambulatory Visit (INDEPENDENT_AMBULATORY_CARE_PROVIDER_SITE_OTHER): Payer: Medicare Other | Admitting: Family Medicine

## 2020-01-02 ENCOUNTER — Encounter: Payer: Self-pay | Admitting: Family Medicine

## 2020-01-02 VITALS — BP 126/71 | HR 61 | Temp 97.9°F | Wt 200.6 lb

## 2020-01-02 DIAGNOSIS — I129 Hypertensive chronic kidney disease with stage 1 through stage 4 chronic kidney disease, or unspecified chronic kidney disease: Secondary | ICD-10-CM | POA: Diagnosis not present

## 2020-01-02 DIAGNOSIS — N3941 Urge incontinence: Secondary | ICD-10-CM | POA: Diagnosis not present

## 2020-01-02 DIAGNOSIS — E782 Mixed hyperlipidemia: Secondary | ICD-10-CM

## 2020-01-02 DIAGNOSIS — I739 Peripheral vascular disease, unspecified: Secondary | ICD-10-CM

## 2020-01-02 DIAGNOSIS — E119 Type 2 diabetes mellitus without complications: Secondary | ICD-10-CM | POA: Diagnosis not present

## 2020-01-02 DIAGNOSIS — E039 Hypothyroidism, unspecified: Secondary | ICD-10-CM

## 2020-01-02 DIAGNOSIS — F3341 Major depressive disorder, recurrent, in partial remission: Secondary | ICD-10-CM

## 2020-01-02 MED ORDER — ESCITALOPRAM OXALATE 10 MG PO TABS: ORAL_TABLET | ORAL | 1 refills | Status: DC

## 2020-01-02 MED ORDER — METFORMIN HCL ER 500 MG PO TB24: ORAL_TABLET | ORAL | 1 refills | Status: DC

## 2020-01-02 MED ORDER — GABAPENTIN 300 MG PO CAPS: 300.0000 mg | ORAL_CAPSULE | Freq: Every day | ORAL | 1 refills | Status: DC

## 2020-01-02 MED ORDER — BACLOFEN 10 MG PO TABS: 10.0000 mg | ORAL_TABLET | Freq: Every day | ORAL | 1 refills | Status: DC

## 2020-01-02 MED ORDER — EZETIMIBE 10 MG PO TABS: 10.0000 mg | ORAL_TABLET | Freq: Every day | ORAL | 1 refills | Status: DC

## 2020-01-02 MED ORDER — LOSARTAN POTASSIUM 50 MG PO TABS: ORAL_TABLET | ORAL | 1 refills | Status: DC

## 2020-01-02 MED ORDER — NAPROXEN 500 MG PO TABS: ORAL_TABLET | ORAL | 1 refills | Status: DC

## 2020-01-02 NOTE — Assessment & Plan Note (Signed)
Under good control on current regimen. Continue current regimen. Continue to monitor. Call with any concerns. Refills given. Labs drawn today.   

## 2020-01-02 NOTE — Assessment & Plan Note (Signed)
Sugar still running high with A1c of 7.5- will increase her metformin to 1000mg  daily and recheck 3 months. Call with any concerns.

## 2020-01-02 NOTE — Assessment & Plan Note (Signed)
Continue to follow with vascular. Keep BP, cholesterol and sugars under good control. Continue to monitor.

## 2020-01-02 NOTE — Progress Notes (Signed)
BP 126/71   Pulse 61   Temp 97.9 F (36.6 C) (Oral)   Wt 200 lb 9.6 oz (91 kg)   SpO2 93%   BMI 33.38 kg/m    Subjective:    Patient ID: Stacy Mercado, female    DOB: 09-02-1946, 74 y.o.   MRN: [id number]  HPI: Stacy Mercado is a 74 y.o. female  Chief Complaint  Patient presents with  . Depression  . Diabetes    pt states she has not had a recent eye exam   . Hyperlipidemia  . Hypertension   HYPERTENSION / HYPERLIPIDEMIA Satisfied with current treatment? no Duration of hypertension: chronic BP monitoring frequency: not checking BP medication side effects: no Past BP meds: losartan Duration of hyperlipidemia: chronic Cholesterol medication side effects: no Cholesterol supplements: none Past cholesterol medications: zetia Medication compliance: excellent compliance Aspirin: yes Recent stressors: no Recurrent headaches: no Visual changes: no Palpitations: no Dyspnea: no Chest pain: no Lower extremity edema: no Dizzy/lightheaded: no  DIABETES Hypoglycemic episodes:no Polydipsia/polyuria: no Visual disturbance: no Chest pain: no Paresthesias: no Glucose Monitoring: no  Accucheck frequency: Not Checking Taking Insulin?: no Blood Pressure Monitoring: not checking Retinal Examination: Not up to Date Foot Exam: done today Diabetic Education: Completed Pneumovax: Up to Date Influenza: Up to Date Aspirin: yes  DEPRESSION Mood status: stable Satisfied with current treatment?: yes Symptom severity: moderate  Duration of current treatment : chronic Side effects: no Medication compliance: excellent compliance Psychotherapy/counseling: no  Previous psychiatric medications: lexapro Depressed mood: yes Anxious mood: yes Anhedonia: no Significant weight loss or gain: no Insomnia: no  Fatigue: yes Feelings of worthlessness or guilt: no Impaired concentration/indecisiveness: no Suicidal ideations: no Hopelessness: no Crying spells: no Depression  screen [facility] 2/9 01/02/2020 03/31/2019 09/17/2018 03/18/2018 03/13/2017  Decreased Interest 0 0 0 0 0  Down, Depressed, Hopeless 1 0 0 0 1  PHQ - 2 Score 1 0 0 0 1  Altered sleeping 3 0 0 0 -  Tired, decreased energy 3 0 0 0 -  Change in appetite 0 0 0 0 -  Feeling bad or failure about yourself  0 0 0 0 -  Trouble concentrating 0 0 0 0 -  Moving slowly or fidgety/restless 0 0 0 0 -  Suicidal thoughts 0 0 0 0 -  PHQ-9 Score 7 0 0 0 -  Difficult doing work/chores Not difficult at all Not difficult at all Not difficult at all Not difficult at all -   HYPOTHYROIDISM Thyroid control status:stable Satisfied with current treatment? no Medication side effects: no Medication compliance: excellent compliance Etiology of hypothyroidism:  Recent dose adjustment:no Fatigue: yes Cold intolerance: yes Heat intolerance: no Weight gain: no Weight loss: no Constipation: yes Diarrhea/loose stools: no Palpitations: no Lower extremity edema: no Anxiety/depressed mood: yes   Relevant past medical, surgical, family and social history reviewed and updated as indicated. Interim medical history since our last visit reviewed. Allergies and medications reviewed and updated.  Review of Systems  Constitutional: Negative.   Respiratory: Negative.   Cardiovascular: Negative.   Gastrointestinal: Negative.   Genitourinary: Negative.        Vaginal itching  Musculoskeletal: Positive for myalgias. Negative for arthralgias, back pain, gait problem, joint swelling, neck pain and neck stiffness.       Muscle cramps  Neurological: Negative.   Psychiatric/Behavioral: Negative.     Per HPI unless specifically indicated above     Objective:    BP 126/71   Pulse 61  Temp 97.9 F (36.6 C) (Oral)   Wt 200 lb 9.6 oz (91 kg)   SpO2 93%   BMI 33.38 kg/m   Wt Readings from Last 3 Encounters:  01/02/20 200 lb 9.6 oz (91 kg)  04/09/19 195 lb (88.5 kg)  03/31/19 194 lb (88 kg)    Physical Exam Vitals and  nursing note reviewed.  Constitutional:      General: She is not in acute distress.    Appearance: Normal appearance. She is not ill-appearing, toxic-appearing or diaphoretic.  HENT:     Head: Normocephalic and atraumatic.     Right Ear: External ear normal.     Left Ear: External ear normal.     Nose: Nose normal.     Mouth/Throat:     Mouth: Mucous membranes are moist.     Pharynx: Oropharynx is clear.  Eyes:     General: No scleral icterus.       Right eye: No discharge.        Left eye: No discharge.     Extraocular Movements: Extraocular movements intact.     Conjunctiva/sclera: Conjunctivae normal.     Pupils: Pupils are equal, round, and reactive to light.  Cardiovascular:     Rate and Rhythm: Normal rate and regular rhythm.     Pulses: Normal pulses.     Heart sounds: Normal heart sounds. No murmur. No friction rub. No gallop.   Pulmonary:     Effort: Pulmonary effort is normal. No respiratory distress.     Breath sounds: Normal breath sounds. No stridor. No wheezing, rhonchi or rales.  Chest:     Chest wall: No tenderness.  Musculoskeletal:        General: Normal range of motion.     Cervical back: Normal range of motion and neck supple.  Skin:    General: Skin is warm and dry.     Capillary Refill: Capillary refill takes less than 2 seconds.     Coloration: Skin is not jaundiced or pale.     Findings: No bruising, erythema, lesion or rash.  Neurological:     General: No focal deficit present.     Mental Status: She is alert and oriented to person, place, and time. Mental status is at baseline.  Psychiatric:        Mood and Affect: Mood normal.        Behavior: Behavior normal.        Thought Content: Thought content normal.        Judgment: Judgment normal.     Results for orders placed or performed in visit on 03/31/19  WET PREP FOR [place], YEAST, CLUE   Specimen: Vaginal Fluid   VAGINAL FLUI  Result Value Ref Range   Trichomonas Exam Negative Negative    Yeast Exam Negative Negative   Clue Cell Exam Positive (A) Negative  Bayer DCA Hb A1c Waived  Result Value Ref Range   HB A1C (BAYER DCA - WAIVED) 7.5 (H) <7.0 %  CBC with Differential/Platelet  Result Value Ref Range   WBC 6.6 3.4 - 10.8 x10E3/uL   RBC 4.15 3.77 - 5.28 x10E6/uL   Hemoglobin 12.2 11.1 - 15.9 g/dL   Hematocrit 36.2 34.0 - 46.6 %   MCV 87 79 - 97 fL   MCH 29.4 26.6 - 33.0 pg   MCHC 33.7 31.5 - 35.7 g/dL   RDW 13.3 11.7 - 15.4 %   Platelets 181 150 - 450 x10E3/uL   Neutrophils 53  Not Estab. %   Lymphs 32 Not Estab. %   Monocytes 12 Not Estab. %   Eos 2 Not Estab. %   Basos 1 Not Estab. %   Neutrophils Absolute 3.5 1.4 - 7.0 x10E3/uL   Lymphocytes Absolute 2.1 0.7 - 3.1 x10E3/uL   Monocytes Absolute 0.8 0.1 - 0.9 x10E3/uL   EOS (ABSOLUTE) 0.2 0.0 - 0.4 x10E3/uL   Basophils Absolute 0.1 0.0 - 0.2 x10E3/uL   Immature Granulocytes 0 Not Estab. %   Immature Grans (Abs) 0.0 0.0 - 0.1 x10E3/uL  Comprehensive metabolic panel  Result Value Ref Range   Glucose 151 (H) 65 - 99 mg/dL   BUN 15 8 - 27 mg/dL   Creatinine, Ser 0.72 0.57 - 1.00 mg/dL   GFR calc non Af Amer 83 >59 mL/min/1.73   GFR calc Af Amer 95 >59 mL/min/1.73   BUN/Creatinine Ratio 21 12 - 28   Sodium 141 134 - 144 mmol/L   Potassium 4.1 3.5 - 5.2 mmol/L   Chloride 105 96 - 106 mmol/L   CO2 22 20 - 29 mmol/L   Calcium 10.3 8.7 - 10.3 mg/dL   Total Protein 6.3 6.0 - 8.5 g/dL   Albumin 4.2 3.7 - 4.7 g/dL   Globulin, Total 2.1 1.5 - 4.5 g/dL   Albumin/Globulin Ratio 2.0 1.2 - 2.2   Bilirubin Total 0.3 0.0 - 1.2 mg/dL   Alkaline Phosphatase 101 39 - 117 IU/L   AST 13 0 - 40 IU/L   ALT 16 0 - 32 IU/L  Lipid Panel w/o Chol/HDL Ratio  Result Value Ref Range   Cholesterol, Total 207 (H) 100 - 199 mg/dL   Triglycerides 191 (H) 0 - 149 mg/dL   HDL 39 (L) >39 mg/dL   VLDL Cholesterol Cal 38 5 - 40 mg/dL   LDL Calculated 130 (H) 0 - 99 mg/dL  Microalbumin, Urine Waived  Result Value Ref Range   Microalb,  Ur Waived 10 0 - 19 mg/L   Creatinine, Urine Waived 200 10 - 300 mg/dL   Microalb/Creat Ratio <30 <30 mg/g  TSH  Result Value Ref Range   TSH 3.240 0.450 - 4.500 uIU/mL  UA/M w/rflx Culture, Routine   Specimen: Urine   URINE  Result Value Ref Range   Specific Gravity, UA 1.025 1.005 - 1.030   pH, UA 5.5 5.0 - 7.5   Color, UA Yellow Yellow   Appearance Ur Clear Clear   Leukocytes,UA Negative Negative   Protein,UA Negative Negative/Trace   Glucose, UA Negative Negative   Ketones, UA Negative Negative   RBC, UA Negative Negative   Bilirubin, UA Negative Negative   Urobilinogen, Ur 0.2 0.2 - 1.0 mg/dL   Nitrite, UA Negative Negative      Assessment & Plan:   Problem List Items Addressed This Visit      Cardiovascular and Mediastinum   PVD (peripheral vascular disease) ([place])    Continue to follow with vascular. Keep BP, cholesterol and sugars under good control. Continue to monitor.       Relevant Medications   losartan (COZAAR) 50 MG tablet   ezetimibe (ZETIA) 10 MG tablet     Endocrine   Controlled type 2 diabetes mellitus ([place]) - Primary    Sugar still running high with A1c of 7.5- will increase her metformin to 1092m daily and recheck 3 months. Call with any concerns.       Relevant Medications   metFORMIN (GLUCOPHAGE-XR) 500 MG 24 hr tablet  losartan (COZAAR) 50 MG tablet   Other Relevant Orders   Bayer DCA Hb A1c Waived   CBC with Differential OUT   Comp Met (CMET)   Microalbumin, Urine Waived   Hypothyroidism    Rechecking levels today. Await results. Call with any concerns. Treat as needed.       Relevant Orders   CBC with Differential OUT   Comp Met (CMET)   TSH     Genitourinary   Benign hypertensive renal disease    Under good control on current regimen. Continue current regimen. Continue to monitor. Call with any concerns. Refills given. Labs drawn today.        Relevant Orders   CBC with Differential OUT   Comp Met (CMET)   Microalbumin,  Urine Waived     Other   Depression    Under good control on current regimen. Continue current regimen. Continue to monitor. Call with any concerns. Refills given. Labs drawn today.        Relevant Medications   escitalopram (LEXAPRO) 10 MG tablet   Other Relevant Orders   CBC with Differential OUT   Comp Met (CMET)   Hyperlipidemia    Under good control on current regimen. Continue current regimen. Continue to monitor. Call with any concerns. Refills given. Labs drawn today.        Relevant Medications   losartan (COZAAR) 50 MG tablet   ezetimibe (ZETIA) 10 MG tablet   Other Relevant Orders   CBC with Differential OUT   Comp Met (CMET)   Lipid Panel w/o Chol/HDL Ratio OUT   Urge incontinence of urine    Continue to follow with urology. Checking urine today. Await results. Call with any concerns.       Relevant Orders   CBC with Differential OUT   Comp Met (CMET)   UA/M w/rflx Culture, Routine       Follow up plan: Return in about 3 months (around 04/01/2020).

## 2020-01-02 NOTE — Assessment & Plan Note (Signed)
Continue to follow with urology. Checking urine today. Await results. Call with any concerns.

## 2020-01-02 NOTE — Assessment & Plan Note (Signed)
Rechecking levels today. Await results. Call with any concerns. Treat as needed.  

## 2020-01-03 LAB — CBC WITH DIFFERENTIAL/PLATELET
Basophils Absolute: 0.1 10*3/uL (ref 0.0–0.2)
Basos: 1 %
EOS (ABSOLUTE): 0.3 10*3/uL (ref 0.0–0.4)
Eos: 4 %
Hematocrit: 39.4 % (ref 34.0–46.6)
Hemoglobin: 12.9 g/dL (ref 11.1–15.9)
Immature Grans (Abs): 0 10*3/uL (ref 0.0–0.1)
Immature Granulocytes: 0 %
Lymphocytes Absolute: 2.2 10*3/uL (ref 0.7–3.1)
Lymphs: 30 %
MCH: 29.8 pg (ref 26.6–33.0)
MCHC: 32.7 g/dL (ref 31.5–35.7)
MCV: 91 fL (ref 79–97)
Monocytes Absolute: 1.1 10*3/uL — ABNORMAL HIGH (ref 0.1–0.9)
Monocytes: 14 %
Neutrophils Absolute: 3.7 10*3/uL (ref 1.4–7.0)
Neutrophils: 51 %
Platelets: 174 10*3/uL (ref 150–450)
RBC: 4.33 x10E6/uL (ref 3.77–5.28)
RDW: 12.8 % (ref 11.7–15.4)
WBC: 7.3 10*3/uL (ref 3.4–10.8)

## 2020-01-03 LAB — COMPREHENSIVE METABOLIC PANEL
ALT: 29 IU/L (ref 0–32)
AST: 20 IU/L (ref 0–40)
Albumin/Globulin Ratio: 1.9 (ref 1.2–2.2)
Albumin: 3.9 g/dL (ref 3.7–4.7)
Alkaline Phosphatase: 93 IU/L (ref 39–117)
BUN/Creatinine Ratio: 25 (ref 12–28)
BUN: 17 mg/dL (ref 8–27)
Bilirubin Total: 0.4 mg/dL (ref 0.0–1.2)
CO2: 25 mmol/L (ref 20–29)
Calcium: 10.3 mg/dL (ref 8.7–10.3)
Chloride: 104 mmol/L (ref 96–106)
Creatinine, Ser: 0.68 mg/dL (ref 0.57–1.00)
GFR calc Af Amer: 99 mL/min/{1.73_m2} (ref >59)
GFR calc non Af Amer: 86 mL/min/{1.73_m2} (ref >59)
Globulin, Total: 2.1 g/dL (ref 1.5–4.5)
Glucose: 182 mg/dL — ABNORMAL HIGH (ref 65–99)
Potassium: 4.1 mmol/L (ref 3.5–5.2)
Sodium: 141 mmol/L (ref 134–144)
Total Protein: 6 g/dL (ref 6.0–8.5)

## 2020-01-03 LAB — LIPID PANEL W/O CHOL/HDL RATIO
Cholesterol, Total: 218 mg/dL — ABNORMAL HIGH (ref 100–199)
HDL: 34 mg/dL — ABNORMAL LOW (ref >39)
LDL Chol Calc (NIH): 130 mg/dL — ABNORMAL HIGH (ref 0–99)
Triglycerides: 301 mg/dL — ABNORMAL HIGH (ref 0–149)
VLDL Cholesterol Cal: 54 mg/dL — ABNORMAL HIGH (ref 5–40)

## 2020-01-03 LAB — TSH: TSH: 3.9 u[IU]/mL (ref 0.450–4.500)

## 2020-01-05 ENCOUNTER — Other Ambulatory Visit: Payer: Self-pay | Admitting: Family Medicine

## 2020-01-05 MED ORDER — CIPROFLOXACIN HCL 500 MG PO TABS: 500.0000 mg | ORAL_TABLET | Freq: Two times a day (BID) | ORAL | 0 refills | Status: DC

## 2020-01-06 ENCOUNTER — Telehealth: Payer: Self-pay | Admitting: Family Medicine

## 2020-01-06 ENCOUNTER — Other Ambulatory Visit: Payer: Self-pay | Admitting: Family Medicine

## 2020-01-06 ENCOUNTER — Encounter: Payer: Self-pay | Admitting: Family Medicine

## 2020-01-06 DIAGNOSIS — E782 Mixed hyperlipidemia: Secondary | ICD-10-CM

## 2020-01-06 NOTE — Chronic Care Management (AMB) (Signed)
  Chronic Care Management   Note  01/06/2020 Name: Stacy Mercado MRN: [id number] DOB: 1946-09-25  Stacy Mercado is a 74 y.o. year old female who is a primary care patient of Valerie Roys, DO. I reached out to Stacy Mercado by phone today in response to a referral sent by Stacy Mercado's PCP, Park Liter DO     Stacy Mercado information about Chronic Care Management services today including:  1. CCM service includes personalized support from designated clinical staff supervised by her physician, including individualized plan of care and coordination with other care providers 2. 24/7 contact phone numbers for assistance for urgent and routine care needs. 3. Service will only be billed when office clinical staff spend 20 minutes or more in a month to coordinate care. 4. Only one practitioner may furnish and bill the service in a calendar month. 5. The patient may stop CCM services at any time (effective at the end of the month) by phone call to the office staff. 6. The patient will be responsible for cost sharing (co-pay) of up to 20% of the service fee (after annual deductible is met).  Patient agreed to services and verbal consent obtained.   Follow up plan: Telephone appointment with care management team member scheduled for:01/30/2020  Glenna Durand, LPN Health Advisor, [facility] ??[email or web address] ??[phone]

## 2020-01-06 NOTE — Chronic Care Management (AMB) (Signed)
  Care Management   Outreach Note  01/06/2020 Name: Stacy Mercado MRN: [id number] DOB: March 21, 1946  Referred by: Valerie Roys, DO Reason for referral :  Care Management (CM Initial outreach unsuccessful)   An unsuccessful telephone outreach was attempted today. The patient was referred to the case management team by for assistance with care management and care coordination.   Follow Up Plan: A HIPPA compliant phone message was left for the patient providing contact information and requesting a return call.  The care management team will reach out to the patient again over the next 7 days.  If patient returns call to provider office, please advise to call Embedded Care Management Care Guide Glenna Durand LPN at [phone]  Mearle Drew, LPN Health Advisor, [place] [facility] ??[email or web address] ??[phone]

## 2020-01-07 ENCOUNTER — Other Ambulatory Visit: Payer: Self-pay

## 2020-01-07 ENCOUNTER — Ambulatory Visit (INDEPENDENT_AMBULATORY_CARE_PROVIDER_SITE_OTHER): Payer: Medicare Other

## 2020-01-07 ENCOUNTER — Ambulatory Visit (INDEPENDENT_AMBULATORY_CARE_PROVIDER_SITE_OTHER): Payer: Medicare Other | Admitting: Pharmacist

## 2020-01-07 VITALS — BP 121/80 | HR 60 | Temp 98.7°F | Ht 62.0 in | Wt 200.6 lb

## 2020-01-07 DIAGNOSIS — Z0000 Encounter for general adult medical examination without abnormal findings: Secondary | ICD-10-CM

## 2020-01-07 DIAGNOSIS — E119 Type 2 diabetes mellitus without complications: Secondary | ICD-10-CM

## 2020-01-07 DIAGNOSIS — E782 Mixed hyperlipidemia: Secondary | ICD-10-CM

## 2020-01-07 DIAGNOSIS — I129 Hypertensive chronic kidney disease with stage 1 through stage 4 chronic kidney disease, or unspecified chronic kidney disease: Secondary | ICD-10-CM

## 2020-01-07 LAB — UA/M W/RFLX CULTURE, ROUTINE
Bilirubin, UA: NEGATIVE
Glucose, UA: NEGATIVE
Ketones, UA: NEGATIVE
Nitrite, UA: POSITIVE — AB
Protein,UA: NEGATIVE
RBC, UA: NEGATIVE
Specific Gravity, UA: 1.025 (ref 1.005–1.030)
Urobilinogen, Ur: 0.2 mg/dL (ref 0.2–1.0)
pH, UA: 5.5 (ref 5.0–7.5)

## 2020-01-07 LAB — MICROSCOPIC EXAMINATION: RBC, Urine: NONE SEEN /hpf (ref 0–2)

## 2020-01-07 LAB — MICROALBUMIN, URINE WAIVED
Creatinine, Urine Waived: 200 mg/dL (ref 10–300)
Microalb, Ur Waived: 10 mg/L (ref 0–19)
Microalb/Creat Ratio: <30 mg/g (ref <30)

## 2020-01-07 LAB — URINE CULTURE, REFLEX

## 2020-01-07 LAB — BAYER DCA HB A1C WAIVED: HB A1C (BAYER DCA - WAIVED): 7.5 % — ABNORMAL HIGH (ref <7.0)

## 2020-01-07 NOTE — Chronic Care Management (AMB) (Signed)
Chronic Care Management   Note  01/07/2020 Name: Stacy Mercado MRN: [id number] DOB: 02-07-1946   Subjective:  Stacy Mercado is a 74 y.o. year old female who is a primary care patient of Valerie Roys, DO. The CCM team was consulted for assistance with chronic disease management and care coordination needs.    Met with patient face to face today after her annual wellness visit.   Review of patient status, including review of consultants reports, laboratory and other test data, was performed as part of comprehensive evaluation and provision of chronic care management services.   Objective:  Lab Results  Component Value Date   CREATININE 0.68 01/02/2020   CREATININE 0.72 03/31/2019   CREATININE 0.77 09/17/2018    Lab Results  Component Value Date   HGBA1C 7.5 (H) 01/02/2020       Component Value Date/Time   CHOL 218 (H) 01/02/2020 0929   CHOL 211 (H) 08/14/2016 1025   TRIG 301 (H) 01/02/2020 0929   TRIG 226 (H) 08/14/2016 1025   HDL 34 (L) 01/02/2020 0929   VLDL 45 (H) 08/14/2016 1025   LDLCALC 130 (H) 01/02/2020 0929    Clinical ASCVD: No - but diagnosis of PVD The 10-year ASCVD risk score Mikey Bussing DC Jr., et al., 2013) is: 33.5%   Values used to calculate the score:     Age: 31 years     Sex: Female     Is Non-Hispanic African American: No     Diabetic: Yes     Tobacco smoker: No     Systolic Blood Pressure: 540 mmHg     Is BP treated: Yes     HDL Cholesterol: 34 mg/dL     Total Cholesterol: 218 mg/dL    BP Readings from Last 3 Encounters:  01/07/20 121/80  01/02/20 126/71  04/09/19 122/80    Allergies  Allergen Reactions  . Statins Other (See Comments)    myalgia    Medications Reviewed Today    Reviewed by Bevelyn Ngo, LPN (Licensed Practical Nurse) on 01/07/20 at [place] List Status: <None>  Medication Order Taking? Sig Documenting Provider Last Dose Status Informant  acetaminophen (TYLENOL) 500 MG tablet [id number] Yes Take 1,000 mg  every 6 (six) hours as needed by mouth for moderate pain or headache.  Valerie Roys, DO Taking Active Self  aspirin EC 325 MG EC tablet [id number] Yes Take 1 tablet (325 mg total) by mouth daily.  Patient taking differently: Take 81 mg by mouth daily.    Lattie Corns, PA-C Taking Active   baclofen (LIORESAL) 10 MG tablet [id number] Yes Take 1 tablet (10 mg total) by mouth at bedtime. Park Liter P, DO Taking Active   ciprofloxacin (CIPRO) 500 MG tablet [id number] Yes Take 1 tablet (500 mg total) by mouth 2 (two) times daily. Wynetta Emery, Megan P, DO Taking Active   escitalopram (LEXAPRO) 10 MG tablet [id number] Yes TAKE 1 TABLET(10 MG) BY MOUTH DAILY Johnson, Megan P, DO Taking Active   ezetimibe (ZETIA) 10 MG tablet [id number] Yes Take 1 tablet (10 mg total) by mouth daily. Johnson, Megan P, DO Taking Active   gabapentin (NEURONTIN) 300 MG capsule [id number] Yes Take 1 capsule (300 mg total) by mouth at bedtime. Park Liter P, DO Taking Active   levothyroxine (SYNTHROID, LEVOTHROID) 50 MCG tablet [id number] Yes Take 1 tablet (50 mcg total) by mouth daily before breakfast. Wynetta Emery, Megan P, DO Taking Active   Lidocaine-Menthol (ICY HOT LIDOCAINE PLUS MENTHOL  EX) [id number] Yes Apply 1 application topically 2 (two) times daily as needed (muscle pain). [provider] Taking Active Self  losartan (COZAAR) 50 MG tablet [id number] Yes TAKE 1 TABLET(50 MG) BY MOUTH DAILY Johnson, Megan P, DO Taking Active   metFORMIN (GLUCOPHAGE-XR) 500 MG 24 hr tablet [id number] Yes TAKE 2 TABLET(1000 MG) BY MOUTH DAILY WITH BREAKFAST Johnson, Megan P, DO Taking Active   Multiple Vitamin (MULTIVITAMIN WITH MINERALS) TABS tablet [id number] Yes Take 1 tablet by mouth daily. [provider] Taking Active Self  naproxen (NAPROSYN) 500 MG tablet [id number] Yes TAKE 1 TABLET(500 MG) BY MOUTH TWICE DAILY WITH A MEAL Johnson, Megan P, DO Taking Active   oxybutynin (DITROPAN-XL) 10 MG 24 hr tablet [id number] Yes  TAKE 1 TABLET(10 MG) BY MOUTH AT BEDTIME Hollice Espy, MD Taking Active            Assessment:   Goals Addressed            This Visit's Progress     Patient Stated   . PharmD "My medication is expensive" (pt-stated)       Current Barriers:  . Polypharmacy; complex patient with multiple comorbidities including HLD, PVD, T2DM, HTN . Self-manages medications  . Reports that ezetimibe was previously ~$15, and is now $129 o HLD: ezetimibe 10 mg daily. Report hx statin intolerance, specific agents and doses unknown, were prior to electronic records in our system. LDL on therapy 130. 10 year ASCVD risk 33.5% o ASCVD risk reduction: ASA 81 mg daily  o T2DM: last A1c 7.5%, metformin increased to 1000 mg daily.  o HTN: losartan 50 mg daily, last BP at goal <130/80 o Overactive bladder: oxybutynin XL 10 mg daily  o Depression: escitalopram 10 mg daily o Sciatica: gabapentin 600 mg daily  Pharmacist Clinical Goal(s):  Marland Kitchen Over the next 90 days, patient will work with PharmD and provider towards optimized medication management  Interventions: . Comprehensive medication review performed; medication list updated in electronic medical record . [place]. They have a AARP plan on file for patient - BIN [id number], PCN [id number], ID [id number], Group [id number]. This corresponds w/ [facility] Plan. Appears there is a $445 deductible for Tier 3-5 medications, and ezetimibe is Tier 3. May have been a lower tier last year. Can collaborate w/ office staff to complete Tier Exception Request, that hopefully would lower cost of medication to Tier 2 or $20, and she would not have to pay towards her deductible for this medication.  Marland Kitchen However, would likely target LDL of at least <100 given T2DM, HTN, and ezetimibe monotherapy does not meet that target. Reviewed PA criteria for [place] on her plan - she does meet criteria d/t LDL >100 on max tolerated therapy. Could also plan to  assist patient in pursuing funding assistance through [facility], which would lower the cost of $0. Will communicate w/ Dr. Wynetta Emery to determine which path she would like to pursue.   Patient Self Care Activities:  . Patient will take medications as prescribed  Initial goal documentation        Plan: - Will collaborate w/ Dr. Wynetta Emery on preferred route of treatment as above  Courtney Heys, PharmD, [place] [phone]

## 2020-01-07 NOTE — Progress Notes (Signed)
Subjective:   Stacy Mercado is a 74 y.o. female who presents for Medicare Annual (Subsequent) preventive examination.  This visit is being conducted via phone call  - after an attmept to do on video chat - due to the COVID-19 pandemic. This patient has given me verbal consent via phone to conduct this visit, patient states they are participating from their home address. Some vital signs may be absent or patient reported.   Patient identification: identified by name, DOB, and current address.    Review of Systems:   Cardiac Risk Factors include: advanced age (>64men, >63 women);dyslipidemia;hypertension;obesity (BMI >30kg/m2);diabetes mellitus     Objective:     Vitals: BP 121/80 (BP Location: Left Arm, Patient Position: Sitting, Cuff Size: Normal)   Pulse 60   Temp 98.7 F (37.1 C)   Ht 5\' 2"  (1.575 m)   Wt 200 lb 9.6 oz (91 kg)   BMI 36.69 kg/m   Body mass index is 36.69 kg/m.  Advanced Directives 01/07/2020 03/18/2018 11/20/2017 11/05/2017 03/13/2017 01/30/2017 01/23/2017  Does Patient Have a Medical Advance Directive? No No No No No No No  Does patient want to make changes to medical advance directive? Yes (MAU/Ambulatory/Procedural Areas - Information given) - - - - - -  Would patient like information on creating a medical advance directive? - Yes (MAU/Ambulatory/Procedural Areas - Information given) No - Patient declined No - Patient declined Yes (MAU/Ambulatory/Procedural Areas - Information given) No - Patient declined No - Patient declined    Tobacco Social History   Tobacco Use  Smoking Status Former Smoker  . Years: 50.00  . Types: Cigarettes  . Quit date: 12/18/2006  . Years since quitting: 13.0  Smokeless Tobacco Never Used     Counseling given: Not Answered   Clinical Intake:  Pre-visit preparation completed: Yes  Pain : No/denies pain     Nutritional Status: BMI > 30  Obese Nutritional Risks: None Diabetes: Yes CBG done?: No Did pt. bring in CBG  monitor from home?: No  How often do you need to have someone help you when you read instructions, pamphlets, or other written materials from your doctor or pharmacy?: 1 - Never  Interpreter Needed?: No  Information entered by :: Zafir Schauer,LPN  Past Medical History:  Diagnosis Date  . Arthritis   . Bladder cancer ([place])   . Chronic low back pain   . Depression   . Diabetes mellitus without complication ([place])   . Hyperlipidemia 08/07/2016  . Hypertension   . Personal history of chemotherapy    Bladder Ca  . PVD (peripheral vascular disease) ([place])   . Urge incontinence of urine 08/07/2016   Past Surgical History:  Procedure Laterality Date  . ABDOMINAL HYSTERECTOMY    . APPENDECTOMY  1961  . bladder cancer removed  2014  . CATARACT EXTRACTION W/ INTRAOCULAR LENS  IMPLANT, BILATERAL    . complete hysterectomy  1999  . DILATION AND CURETTAGE OF UTERUS  1999  . L rotator cuff Left 2010  . R thumb ruptured ligament and tendon  2012  . REVERSE SHOULDER ARTHROPLASTY Right 11/20/2017   Procedure: REVERSE SHOULDER ARTHROPLASTY;  Surgeon: Corky Mull, MD;  Location: [facility] ORS;  Service: Orthopedics;  Laterality: Right;  . SHOULDER ARTHROSCOPY WITH BICEPSTENOTOMY Right 01/30/2017   Procedure: SHOULDER ARTHROSCOPY WITH BICEPSTENOTOMY;  Surgeon: Corky Mull, MD;  Location: [facility] ORS;  Service: Orthopedics;  Laterality: Right;  biceps tenolysis  . SHOULDER ARTHROSCOPY WITH OPEN ROTATOR CUFF REPAIR Right  01/30/2017   Procedure: SHOULDER ARTHROSCOPY WITH OPEN ROTATOR CUFF REPAIR;  Surgeon: Corky Mull, MD;  Location: [facility] ORS;  Service: Orthopedics;  Laterality: Right;  Massive rotator cuff tear repair   Family History  Problem Relation Age of Onset  . Hypertension Brother   . Diabetes Brother   . Healthy Son   . Healthy Son   . Healthy Daughter   . Kidney cancer Neg Hx   . Bladder Cancer Neg Hx   . Breast cancer Neg Hx    Social History   Socioeconomic History  . Marital status:  Divorced    Spouse name: Not on file  . Number of children: Not on file  . Years of education: Not on file  . Highest education level: Not on file  Occupational History  . Not on file  Tobacco Use  . Smoking status: Former Smoker    Years: 50.00    Types: Cigarettes    Quit date: 12/18/2006    Years since quitting: 13.0  . Smokeless tobacco: Never Used  Substance and Sexual Activity  . Alcohol use: Yes    Alcohol/week: 7.0 standard drinks    Types: 7 Shots of liquor per week    Comment: glass of liquor nightly  . Drug use: No  . Sexual activity: Not on file  Other Topics Concern  . Not on file  Social History Narrative  . Not on file   Social Determinants of Health   Financial Resource Strain:   . Difficulty of Paying Living Expenses: Not on file  Food Insecurity:   . Worried About Charity fundraiser in the Last Year: Not on file  . Ran Out of Food in the Last Year: Not on file  Transportation Needs:   . Lack of Transportation (Medical): Not on file  . Lack of Transportation (Non-Medical): Not on file  Physical Activity:   . Days of Exercise per Week: Not on file  . Minutes of Exercise per Session: Not on file  Stress:   . Feeling of Stress : Not on file  Social Connections:   . Frequency of Communication with Friends and Family: Not on file  . Frequency of Social Gatherings with Friends and Family: Not on file  . Attends Religious Services: Not on file  . Active Member of Clubs or Organizations: Not on file  . Attends Archivist Meetings: Not on file  . Marital Status: Not on file    Outpatient Encounter Medications as of 01/07/2020  Medication Sig  . acetaminophen (TYLENOL) 500 MG tablet Take 1,000 mg every 6 (six) hours as needed by mouth for moderate pain or headache.   Marland Kitchen aspirin EC 325 MG EC tablet Take 1 tablet (325 mg total) by mouth daily. (Patient taking differently: Take 81 mg by mouth daily. )  . baclofen (LIORESAL) 10 MG tablet Take 1 tablet  (10 mg total) by mouth at bedtime.  . ciprofloxacin (CIPRO) 500 MG tablet Take 1 tablet (500 mg total) by mouth 2 (two) times daily.  Marland Kitchen escitalopram (LEXAPRO) 10 MG tablet TAKE 1 TABLET(10 MG) BY MOUTH DAILY  . ezetimibe (ZETIA) 10 MG tablet Take 1 tablet (10 mg total) by mouth daily.  Marland Kitchen gabapentin (NEURONTIN) 300 MG capsule Take 1 capsule (300 mg total) by mouth at bedtime.  Marland Kitchen levothyroxine (SYNTHROID, LEVOTHROID) 50 MCG tablet Take 1 tablet (50 mcg total) by mouth daily before breakfast.  . Lidocaine-Menthol (ICY HOT LIDOCAINE PLUS MENTHOL EX) Apply 1  application topically 2 (two) times daily as needed (muscle pain).  Marland Kitchen losartan (COZAAR) 50 MG tablet TAKE 1 TABLET(50 MG) BY MOUTH DAILY  . metFORMIN (GLUCOPHAGE-XR) 500 MG 24 hr tablet TAKE 2 TABLET(1000 MG) BY MOUTH DAILY WITH BREAKFAST  . Multiple Vitamin (MULTIVITAMIN WITH MINERALS) TABS tablet Take 1 tablet by mouth daily.  . naproxen (NAPROSYN) 500 MG tablet TAKE 1 TABLET(500 MG) BY MOUTH TWICE DAILY WITH A MEAL  . oxybutynin (DITROPAN-XL) 10 MG 24 hr tablet TAKE 1 TABLET(10 MG) BY MOUTH AT BEDTIME   No facility-administered encounter medications on file as of 01/07/2020.    Activities of Daily Living In your present state of health, do you have any difficulty performing the following activities: 01/07/2020 01/02/2020  Hearing? N N  Comment no hearing aids -  Vision? N N  Comment reading glasses, [facility] -  Difficulty concentrating or making decisions? N N  Walking or climbing stairs? N Y  Dressing or bathing? N N  Doing errands, shopping? N N  Preparing Food and eating ? N -  Using the Toilet? N -  In the past six months, have you accidently leaked urine? Y -  Comment washable depends -  Do you have problems with loss of bowel control? N -  Managing your Medications? N -  Managing your Finances? N -  Housekeeping or managing your Housekeeping? N -  Some recent data might be hidden    Patient Care Team: Valerie Roys, DO as PCP - General (Family Medicine) De Hollingshead, [facility] as Pharmacist (Pharmacist)    Assessment:   This is a routine wellness examination for Stacy Mercado.  Exercise Activities and Dietary recommendations Current Exercise Habits: The patient does not participate in regular exercise at present, Exercise limited by: None identified  Goals    . DIET - INCREASE WATER INTAKE     Recommend drinking at least 6-8 glasses of water a day        Fall Risk: Fall Risk  01/07/2020 01/02/2020 03/31/2019 09/17/2018 03/18/2018  Falls in the past year? 0 0 0 No Yes  Number falls in past yr: 0 0 - - 1  Injury with Fall? 0 0 - - Yes  Comment - - - - -  Risk Factor Category  - - - - High Fall Risk  Follow up - Falls evaluation completed - - Falls prevention discussed    FALL RISK PREVENTION PERTAINING TO THE HOME:  Any stairs in or around the home? Yes  If so, are there any without handrails? No   Home free of loose throw rugs in walkways, pet beds, electrical cords, etc? Yes  Adequate lighting in your home to reduce risk of falls? Yes   ASSISTIVE DEVICES UTILIZED TO PREVENT FALLS:  Life alert? No  Use of a cane, walker or w/c? No  Grab bars in the bathroom? No  Shower chair or bench in shower? No  Elevated toilet seat or a handicapped toilet? No   DME ORDERS:  DME order needed?  Yes   TIMED UP AND GO:  Was the test performed? Yes .  Length of time to ambulate 10 feet: 9 sec.   GAIT:  Appearance of gait: Gait steady and fast without the use of an assistive device.  Education: Fall risk prevention has been discussed.  Intervention(s) required? No   DME/home health order needed?  No    Depression Screen PHQ 2/9 Scores 01/02/2020 03/31/2019 09/17/2018 03/18/2018  PHQ -  2 Score 1 0 0 0  PHQ- 9 Score 7 0 0 0     Cognitive Function     6CIT Screen 01/07/2020 03/18/2018 03/13/2017  What Year? 0 points 0 points 0 points  What month? 0 points 0 points 0 points  What time? 0 points  0 points 0 points  Count back from 20 0 points 0 points 0 points  Months in reverse 0 points 0 points 0 points  Repeat phrase 0 points 0 points 0 points  Total Score 0 0 0    Immunization History  Administered Date(s) Administered  . Influenza, High Dose Seasonal PF 08/28/2016, 09/20/2017, 09/17/2018, 08/07/2019  . Pneumococcal Conjugate-13 06/29/2015, 09/27/2016  . Pneumococcal Polysaccharide-23 08/06/2013  . Tdap 11/28/2016  . Zoster 08/06/2013    Qualifies for Shingles Vaccine? Yes  Zostavax completed n/a. Due for Shingrix. Education has been provided regarding the importance of this vaccine. Pt has been advised to call insurance company to determine out of pocket expense. Advised may also receive vaccine at local pharmacy or Health Dept. Verbalized acceptance and understanding.  Tdap: up to date.  Flu Vaccine: up to date   Pneumococcal Vaccine: up to date    Screening Tests Health Maintenance  Topic Date Due  . OPHTHALMOLOGY EXAM  09/12/2017  . FOOT EXAM  03/19/2019  . Fecal DNA (Cologuard)  05/24/2020  . HEMOGLOBIN A1C  07/01/2020  . TETANUS/TDAP  11/28/2026  . INFLUENZA VACCINE  Completed  . DEXA SCAN  Completed  . Hepatitis C Screening  Completed  . PNA vac Low Risk Adult  Completed    Cancer Screenings:  Colorectal Screening: cologuard 05/2017   Mammogram: Completed 11/2019  Bone Density: up to date   Lung Cancer Screening: (Low Dose CT Chest recommended if Age 51-80 years, 30 pack-year currently smoking OR have quit w/in 15years.) does not qualify.     Additional Screening:  Hepatitis C Screening: does qualify; Completed 2017  Vision Screening: Recommended annual ophthalmology exams for early detection of glaucoma and other disorders of the eye. Is the patient up to date with their annual eye exam?  Yes  Who is the provider or what is the name of the office in which the pt attends annual eye exams? [facility]   Dental Screening: Recommended  annual dental exams for proper oral hygiene  Community Resource Referral:  CRR required this visit?  No       Plan:  I have personally reviewed and addressed the Medicare Annual Wellness questionnaire and have noted the following in the patient's chart:  A. Medical and social history B. Use of alcohol, tobacco or illicit drugs  C. Current medications and supplements D. Functional ability and status E.  Nutritional status F.  Physical activity G. Advance directives H. List of other physicians I.  Hospitalizations, surgeries, and ER visits in previous 12 months J.  [place] such as hearing and vision if needed, cognitive and depression L. Referrals and appointments   In addition, I have reviewed and discussed with patient certain preventive protocols, quality metrics, and best practice recommendations. A written personalized care plan for preventive services as well as general preventive health recommendations were provided to patient.  Signed,    Bevelyn Ngo, LPN  [id number] Nurse Health Advisor   Nurse Notes: none

## 2020-01-07 NOTE — Patient Instructions (Signed)
Stacy Mercado , Thank you for taking time to come for your Medicare Wellness Visit. I appreciate your ongoing commitment to your health goals. Please review the following plan we discussed and let me know if I can assist you in the future.   Screening recommendations/referrals: Colonoscopy: no longer required  Mammogram: completed 11/26/2019 Bone Density: up to date  Recommended yearly ophthalmology/optometry visit for glaucoma screening and checkup Recommended yearly dental visit for hygiene and checkup  Vaccinations: Influenza vaccine: up to date Pneumococcal vaccine: up to date  Tdap vaccine: up to date  Shingles vaccine: shingrix eligible     Advanced directives:  Advance directive discussed with you today.Once this is complete please bring a copy in to our office so we can scan it into your chart.  Conditions/risks identified: We are recommending the vaccine to everyone who has not had an allergic reaction to any of the components of the vaccine. If you have specific questions about the vaccine, please bring them up with your health care provider to discuss them.   We will likely not be getting the vaccine in the office for the first rounds of vaccinations. The way they are releasing the vaccines is going to be through the health systems (like [facility], [facility], [facility], [facility]) or through your county health department.   The [facility] Department is giving vaccines to those 75+ starting 12/24/19  M-F 7AM to 4PM [employer] [address], [place], [place] in a drive through tent  If you are 65+ you can get a vaccine through [facility] by signing up for an appointment.  You can sign up by going to: [email or web address].  You can get more information by going to: [email or web address]  Next appointment: Follow up in one year for your annual wellness visit    Preventive Care 65 Years and Older, Female Preventive care refers to  lifestyle choices and visits with your health care provider that can promote health and wellness. What does preventive care include?  A yearly physical exam. This is also called an annual well check.  Dental exams once or twice a year.  Routine eye exams. Ask your health care provider how often you should have your eyes checked.  Personal lifestyle choices, including:  Daily care of your teeth and gums.  Regular physical activity.  Eating a healthy diet.  Avoiding tobacco and drug use.  Limiting alcohol use.  Practicing safe sex.  Taking low-dose aspirin every day.  Taking vitamin and mineral supplements as recommended by your health care provider. What happens during an annual well check? The services and screenings done by your health care provider during your annual well check will depend on your age, overall health, lifestyle risk factors, and family history of disease. Counseling  Your health care provider may ask you questions about your:  Alcohol use.  Tobacco use.  Drug use.  Emotional well-being.  Home and relationship well-being.  Sexual activity.  Eating habits.  History of falls.  Memory and ability to understand (cognition).  Work and work Statistician.  Reproductive health. Screening  You may have the following tests or measurements:  Height, weight, and BMI.  Blood pressure.  Lipid and cholesterol levels. These may be checked every 5 years, or more frequently if you are over 101 years old.  Skin check.  Lung cancer screening. You may have this screening every year starting at age 50 if you have a 30-pack-year history of smoking and currently smoke or  have quit within the past 15 years.  Fecal occult blood test (FOBT) of the stool. You may have this test every year starting at age 19.  Flexible sigmoidoscopy or colonoscopy. You may have a sigmoidoscopy every 5 years or a colonoscopy every 10 years starting at age 71.  Hepatitis C blood  test.  Hepatitis B blood test.  Sexually transmitted disease (STD) testing.  Diabetes screening. This is done by checking your blood sugar (glucose) after you have not eaten for a while (fasting). You may have this done every 1-3 years.  Bone density scan. This is done to screen for osteoporosis. You may have this done starting at age 46.  Mammogram. This may be done every 1-2 years. Talk to your health care provider about how often you should have regular mammograms. Talk with your health care provider about your test results, treatment options, and if necessary, the need for more tests. Vaccines  Your health care provider may recommend certain vaccines, such as:  Influenza vaccine. This is recommended every year.  Tetanus, diphtheria, and acellular pertussis (Tdap, Td) vaccine. You may need a Td booster every 10 years.  Zoster vaccine. You may need this after age 5.  Pneumococcal 13-valent conjugate (PCV13) vaccine. One dose is recommended after age 79.  Pneumococcal polysaccharide (PPSV23) vaccine. One dose is recommended after age 29. Talk to your health care provider about which screenings and vaccines you need and how often you need them. This information is not intended to replace advice given to you by your health care provider. Make sure you discuss any questions you have with your health care provider. Document Released: 12/31/2015 Document Revised: 08/23/2016 Document Reviewed: 10/05/2015 Elsevier Interactive Patient Education  2017 [place] Prevention in the Home Falls can cause injuries. They can happen to people of all ages. There are many things you can do to make your home safe and to help prevent falls. What can I do on the outside of my home?  Regularly fix the edges of walkways and driveways and fix any cracks.  Remove anything that might make you trip as you walk through a door, such as a raised step or threshold.  Trim any bushes or trees on the  path to your home.  Use bright outdoor lighting.  Clear any walking paths of anything that might make someone trip, such as rocks or tools.  Regularly check to see if handrails are loose or broken. Make sure that both sides of any steps have handrails.  Any raised decks and porches should have guardrails on the edges.  Have any leaves, snow, or ice cleared regularly.  Use sand or salt on walking paths during winter.  Clean up any spills in your garage right away. This includes oil or grease spills. What can I do in the bathroom?  Use night lights.  Install grab bars by the toilet and in the tub and shower. Do not use towel bars as grab bars.  Use non-skid mats or decals in the tub or shower.  If you need to sit down in the shower, use a plastic, non-slip stool.  Keep the floor dry. Clean up any water that spills on the floor as soon as it happens.  Remove soap buildup in the tub or shower regularly.  Attach bath mats securely with double-sided non-slip rug tape.  Do not have throw rugs and other things on the floor that can make you trip. What can I do in the bedroom?  Use night lights.  Make sure that you have a light by your bed that is easy to reach.  Do not use any sheets or blankets that are too big for your bed. They should not hang down onto the floor.  Have a firm chair that has side arms. You can use this for support while you get dressed.  Do not have throw rugs and other things on the floor that can make you trip. What can I do in the kitchen?  Clean up any spills right away.  Avoid walking on wet floors.  Keep items that you use a lot in easy-to-reach places.  If you need to reach something above you, use a strong step stool that has a grab bar.  Keep electrical cords out of the way.  Do not use floor polish or wax that makes floors slippery. If you must use wax, use non-skid floor wax.  Do not have throw rugs and other things on the floor that can  make you trip. What can I do with my stairs?  Do not leave any items on the stairs.  Make sure that there are handrails on both sides of the stairs and use them. Fix handrails that are broken or loose. Make sure that handrails are as long as the stairways.  Check any carpeting to make sure that it is firmly attached to the stairs. Fix any carpet that is loose or worn.  Avoid having throw rugs at the top or bottom of the stairs. If you do have throw rugs, attach them to the floor with carpet tape.  Make sure that you have a light switch at the top of the stairs and the bottom of the stairs. If you do not have them, ask someone to add them for you. What else can I do to help prevent falls?  Wear shoes that:  Do not have high heels.  Have rubber bottoms.  Are comfortable and fit you well.  Are closed at the toe. Do not wear sandals.  If you use a stepladder:  Make sure that it is fully opened. Do not climb a closed stepladder.  Make sure that both sides of the stepladder are locked into place.  Ask someone to hold it for you, if possible.  Clearly mark and make sure that you can see:  Any grab bars or handrails.  First and last steps.  Where the edge of each step is.  Use tools that help you move around (mobility aids) if they are needed. These include:  Canes.  Walkers.  Scooters.  Crutches.  Turn on the lights when you go into a dark area. Replace any light bulbs as soon as they burn out.  Set up your furniture so you have a clear path. Avoid moving your furniture around.  If any of your floors are uneven, fix them.  If there are any pets around you, be aware of where they are.  Review your medicines with your doctor. Some medicines can make you feel dizzy. This can increase your chance of falling. Ask your doctor what other things that you can do to help prevent falls. This information is not intended to replace advice given to you by your health care  provider. Make sure you discuss any questions you have with your health care provider. Document Released: 09/30/2009 Document Revised: 05/11/2016 Document Reviewed: 01/08/2015 Elsevier Interactive Patient Education  2017 [employer].

## 2020-01-07 NOTE — Patient Instructions (Signed)
Visit Information  Goals Addressed            This Visit's Progress     Patient Stated   . PharmD "My medication is expensive" (pt-stated)       Current Barriers:  . Polypharmacy; complex patient with multiple comorbidities including HLD, PVD, T2DM, HTN . Self-manages medications  . Reports that ezetimibe was previously ~$15, and is now $129 o HLD: ezetimibe 10 mg daily. Report hx statin intolerance, specific agents and doses unknown, were prior to electronic records in our system. LDL on therapy 130. 10 year ASCVD risk 33.5% o ASCVD risk reduction: ASA 81 mg daily  o T2DM: last A1c 7.5%, metformin increased to 1000 mg daily.  o HTN: losartan 50 mg daily, last BP at goal <130/80 o Overactive bladder: oxybutynin XL 10 mg daily  o Depression: escitalopram 10 mg daily o Sciatica: gabapentin 600 mg daily  Pharmacist Clinical Goal(s):  Marland Kitchen Over the next 90 days, patient will work with PharmD and provider towards optimized medication management  Interventions: . Comprehensive medication review performed; medication list updated in electronic medical record . [place]. They have a A[employer] plan on file for patient - BIN [id number], PCN [id number], ID [id number], Group [id number]. This corresponds w/ [facility] Plan. Appears there is a $445 deductible for Tier 3-5 medications, and ezetimibe is Tier 3. May have been a lower tier last year. Can collaborate w/ office staff to complete Tier Exception Request, that hopefully would lower cost of medication to Tier 2 or $20, and she would not have to pay towards her deductible for this medication.  Marland Kitchen However, would likely target LDL of at least <100 given T2DM, HTN, and ezetimibe monotherapy does not meet that target. Reviewed PA criteria for Repatha on her plan - she does meet criteria d/t LDL >100 on max tolerated therapy. Could also plan to assist patient in pursuing funding assistance through [facility], which would  lower the cost of $0. Will communicate w/ Dr. Wynetta Emery to determine which path she would like to pursue.   Patient Self Care Activities:  . Patient will take medications as prescribed  Initial goal documentation        The patient verbalized understanding of instructions provided today and declined a print copy of patient instruction materials.   Plan: - Will collaborate w/ Dr. Wynetta Emery on preferred route of treatment as above  Courtney Heys, PharmD, [place] [phone]

## 2020-01-09 ENCOUNTER — Ambulatory Visit: Payer: Self-pay | Admitting: Pharmacist

## 2020-01-09 DIAGNOSIS — E782 Mixed hyperlipidemia: Secondary | ICD-10-CM

## 2020-01-09 DIAGNOSIS — E119 Type 2 diabetes mellitus without complications: Secondary | ICD-10-CM

## 2020-01-09 NOTE — Patient Instructions (Signed)
Visit Information  Goals Addressed            This Visit's Progress     Patient Stated   . PharmD "My medication is expensive" (pt-stated)       Current Barriers:  . Polypharmacy; complex patient with multiple comorbidities including HLD, PVD, T2DM, HTN . Self-manages medications  o HLD: ezetimibe 10 mg daily.  - Report hx statin intolerance; specific doses unknown, but notes intolerance to atorvastatin, simvastatin, and pravastatin - LDL on therapy 130.  - 10 year ASCVD risk 33.5%.  - Hx peripheral vascular disease o ASCVD risk reduction: ASA 81 mg daily  o T2DM: last A1c 7.5%, metformin increased to 1000 mg daily.  o HTN: losartan 50 mg daily, last BP at goal <130/80 o Overactive bladder: oxybutynin XL 10 mg daily  o Depression: escitalopram 10 mg daily o Sciatica: gabapentin 600 mg daily  Pharmacist Clinical Goal(s):  Marland Kitchen Over the next 90 days, patient will work with PharmD and provider towards optimized medication management  Interventions: . Comprehensive medication review performed; medication list updated in electronic medical record . Discussed w/ PCP and Dr. Wynetta Emery; collaboratively decided to pursue Repatha coverage and medication access. Will collaborate w/ Dr. Wynetta Emery to send prescription for Repatha to [facility]. PA will be required; clinical information needed is in red above. Once PA completed, will collaborate w/ patient to contact [place] to apply for grant funding through their hyperlipidemia assistance fund.  . Extensive discussion w/ patient about risks of uncontrolled LDL, various components of lipid panel, and dosing of Repatha. Will plan to bring patient into clinic for first injection.  Patient Self Care Activities:  . Patient will take medications as prescribed  Please see past updates related to this goal by clicking on the "Past Updates" button in the selected goal         The patient verbalized understanding of instructions provided  today and declined a print copy of patient instruction materials.   Plan:  - Will collaborate w/ patient and Dr. Wynetta Emery as above  Courtney Heys, PharmD, [place] [phone]

## 2020-01-09 NOTE — Chronic Care Management (AMB) (Signed)
Chronic Care Management   Follow Up Note   01/09/2020 Name: Stacy Mercado MRN: [id number] DOB: Feb 29, 1946  Referred by: Valerie Roys, DO Reason for referral : Chronic Care Management (Medication Management)   Stacy Mercado is a 74 y.o. year old female who is a primary care patient of Valerie Roys, DO. The CCM team was consulted for assistance with chronic disease management and care coordination needs.    Contacted patient for medication management follow up.  Review of patient status, including review of consultants reports, relevant laboratory and other test results, and collaboration with appropriate care team members and the patient's provider was performed as part of comprehensive patient evaluation and provision of chronic care management services.    SDOH (Social Determinants of Health) screening performed today: Financial Strain . See Care Plan for related entries.   Outpatient Encounter Medications as of 01/09/2020  Medication Sig  . acetaminophen (TYLENOL) 500 MG tablet Take 1,000 mg every 6 (six) hours as needed by mouth for moderate pain or headache.   Marland Kitchen aspirin EC 325 MG EC tablet Take 1 tablet (325 mg total) by mouth daily. (Patient taking differently: Take 81 mg by mouth daily. )  . baclofen (LIORESAL) 10 MG tablet Take 1 tablet (10 mg total) by mouth at bedtime.  . ciprofloxacin (CIPRO) 500 MG tablet Take 1 tablet (500 mg total) by mouth 2 (two) times daily.  Marland Kitchen escitalopram (LEXAPRO) 10 MG tablet TAKE 1 TABLET(10 MG) BY MOUTH DAILY  . ezetimibe (ZETIA) 10 MG tablet Take 1 tablet (10 mg total) by mouth daily.  Marland Kitchen gabapentin (NEURONTIN) 300 MG capsule Take 1 capsule (300 mg total) by mouth at bedtime.  Marland Kitchen levothyroxine (SYNTHROID, LEVOTHROID) 50 MCG tablet Take 1 tablet (50 mcg total) by mouth daily before breakfast.  . Lidocaine-Menthol (ICY HOT LIDOCAINE PLUS MENTHOL EX) Apply 1 application topically 2 (two) times daily as needed (muscle pain).  Marland Kitchen losartan  (COZAAR) 50 MG tablet TAKE 1 TABLET(50 MG) BY MOUTH DAILY  . metFORMIN (GLUCOPHAGE-XR) 500 MG 24 hr tablet TAKE 2 TABLET(1000 MG) BY MOUTH DAILY WITH BREAKFAST  . Multiple Vitamin (MULTIVITAMIN WITH MINERALS) TABS tablet Take 1 tablet by mouth daily.  . naproxen (NAPROSYN) 500 MG tablet TAKE 1 TABLET(500 MG) BY MOUTH TWICE DAILY WITH A MEAL  . oxybutynin (DITROPAN-XL) 10 MG 24 hr tablet TAKE 1 TABLET(10 MG) BY MOUTH AT BEDTIME   No facility-administered encounter medications on file as of 01/09/2020.     Objective:   Goals Addressed            This Visit's Progress     Patient Stated   . PharmD "My medication is expensive" (pt-stated)       Current Barriers:  . Polypharmacy; complex patient with multiple comorbidities including HLD, PVD, T2DM, HTN . Self-manages medications  o HLD: ezetimibe 10 mg daily.  - Report hx statin intolerance; specific doses unknown, but notes intolerance to atorvastatin, simvastatin, and pravastatin - LDL on therapy 130.  - 10 year ASCVD risk 33.5%.  - Hx peripheral vascular disease o ASCVD risk reduction: ASA 81 mg daily  o T2DM: last A1c 7.5%, metformin increased to 1000 mg daily.  o HTN: losartan 50 mg daily, last BP at goal <130/80 o Overactive bladder: oxybutynin XL 10 mg daily  o Depression: escitalopram 10 mg daily o Sciatica: gabapentin 600 mg daily  Pharmacist Clinical Goal(s):  Marland Kitchen Over the next 90 days, patient will work with PharmD and provider towards  optimized medication management  Interventions: . Comprehensive medication review performed; medication list updated in electronic medical record . Discussed w/ PCP and Dr. Wynetta Emery; collaboratively decided to pursue Repatha coverage and medication access. Will collaborate w/ Dr. Wynetta Emery to send prescription for Repatha to [facility]. PA will be required; clinical information needed is in red above. Once PA completed, will collaborate w/ patient to contact [place] to apply for  grant funding through their hyperlipidemia assistance fund.  . Extensive discussion w/ patient about risks of uncontrolled LDL, various components of lipid panel, and dosing of Repatha. Will plan to bring patient into clinic for first injection.  Patient Self Care Activities:  . Patient will take medications as prescribed  Please see past updates related to this goal by clicking on the "Past Updates" button in the selected goal          Plan:  - Will collaborate w/ patient and Dr. Wynetta Emery as above  Courtney Heys, PharmD, [place] [phone]

## 2020-01-13 MED ORDER — REPATHA SURECLICK 140 MG/ML ~~LOC~~ SOAJ: 140.0000 mg | SUBCUTANEOUS | 6 refills | Status: DC

## 2020-01-13 NOTE — Addendum Note (Signed)
Addended by: Valerie Roys on: 01/13/2020 12:42 PM   Modules accepted: Orders

## 2020-01-14 ENCOUNTER — Ambulatory Visit: Payer: Self-pay | Admitting: Pharmacist

## 2020-01-14 ENCOUNTER — Telehealth: Payer: Self-pay | Admitting: Pharmacist

## 2020-01-14 DIAGNOSIS — I739 Peripheral vascular disease, unspecified: Secondary | ICD-10-CM

## 2020-01-14 DIAGNOSIS — E119 Type 2 diabetes mellitus without complications: Secondary | ICD-10-CM

## 2020-01-14 DIAGNOSIS — E782 Mixed hyperlipidemia: Secondary | ICD-10-CM | POA: Diagnosis not present

## 2020-01-14 DIAGNOSIS — I129 Hypertensive chronic kidney disease with stage 1 through stage 4 chronic kidney disease, or unspecified chronic kidney disease: Secondary | ICD-10-CM

## 2020-01-14 NOTE — Patient Instructions (Signed)
Visit Information  Goals Addressed            This Visit's Progress     Patient Stated   . PharmD "My medication is expensive" (pt-stated)       Current Barriers:  . Polypharmacy; complex patient with multiple comorbidities including HLD, PVD, T2DM, HTN o Sent Repatha. PA approved. Patient called me today to let me know she received a call from [facility] that cost was >$400  . Self-manages medications  o HLD: ezetimibe 10 mg daily. Decided to start Repatha therapy d/t uncontrolled LDL on max tolerated statin (none) and ezetimibe  - Report hx statin intolerance d/t myalgias; specific doses unknown, but notes intolerance to atorvastatin, simvastatin, and pravastatin - LDL on therapy 130 - 10 year ASCVD risk 33.5%.  - Hx peripheral vascular disease o ASCVD risk reduction: ASA 81 mg daily  o T2DM: last A1c 7.5%, metformin increased to 1000 mg daily.  o HTN: losartan 50 mg daily, last BP at goal <130/80 o Overactive bladder: oxybutynin XL 10 mg daily  o Depression: escitalopram 10 mg daily o Sciatica: gabapentin 600 mg daily  Pharmacist Clinical Goal(s):  Marland Kitchen Over the next 90 days, patient will work with PharmD and provider towards optimized medication management  Interventions: . Contacted patient. Discussed Multimedia programmer for [employer]. She will contact them tomorrow.  . [place]. Repatha went through insurance, but is $421.27 for a 28 day supply due to her insurance deductible. If approved, she will get [employer] funding that should help pay the deductible, but once deductible is paid, copay will be more manageable and [employer] funding will cover therapy for the year  Patient Self Care Activities:  . Patient will take medications as prescribed  Please see past updates related to this goal by clicking on the "Past Updates" button in the selected goal         The patient verbalized understanding of instructions provided  today and declined a print copy of patient instruction materials.   Plan:  - Will collaborate w/ team as above  [name], PharmD, [place] [phone]

## 2020-01-14 NOTE — Chronic Care Management (AMB) (Signed)
Chronic Care Management   Follow Up Note   01/14/2020 Name: Stacy Mercado MRN: [id number] DOB: Nov 10, 1946  Referred by: Valerie Roys, DO Reason for referral : Chronic Care Management (Medication Management)   Stacy Mercado is a 74 y.o. year old female who is a primary care patient of Valerie Roys, DO. The CCM team was consulted for assistance with chronic disease management and care coordination needs.    Care coordination completed today.   Review of patient status, including review of consultants reports, relevant laboratory and other test results, and collaboration with appropriate care team members and the patient's provider was performed as part of comprehensive patient evaluation and provision of chronic care management services.    SDOH (Social Determinants of Health) screening performed today: Financial Strain . See Care Plan for related entries.   Outpatient Encounter Medications as of 01/14/2020  Medication Sig  . acetaminophen (TYLENOL) 500 MG tablet Take 1,000 mg every 6 (six) hours as needed by mouth for moderate pain or headache.   Marland Kitchen aspirin EC 325 MG EC tablet Take 1 tablet (325 mg total) by mouth daily. (Patient taking differently: Take 81 mg by mouth daily. )  . baclofen (LIORESAL) 10 MG tablet Take 1 tablet (10 mg total) by mouth at bedtime.  . ciprofloxacin (CIPRO) 500 MG tablet Take 1 tablet (500 mg total) by mouth 2 (two) times daily.  Marland Kitchen escitalopram (LEXAPRO) 10 MG tablet TAKE 1 TABLET(10 MG) BY MOUTH DAILY  . Evolocumab (REPATHA SURECLICK) XX123456 MG/ML SOAJ Inject 140 mg into the skin every 14 (fourteen) days.  Marland Kitchen ezetimibe (ZETIA) 10 MG tablet Take 1 tablet (10 mg total) by mouth daily.  Marland Kitchen gabapentin (NEURONTIN) 300 MG capsule Take 1 capsule (300 mg total) by mouth at bedtime.  Marland Kitchen levothyroxine (SYNTHROID, LEVOTHROID) 50 MCG tablet Take 1 tablet (50 mcg total) by mouth daily before breakfast.  . Lidocaine-Menthol (ICY HOT LIDOCAINE PLUS MENTHOL EX) Apply  1 application topically 2 (two) times daily as needed (muscle pain).  Marland Kitchen losartan (COZAAR) 50 MG tablet TAKE 1 TABLET(50 MG) BY MOUTH DAILY  . metFORMIN (GLUCOPHAGE-XR) 500 MG 24 hr tablet TAKE 2 TABLET(1000 MG) BY MOUTH DAILY WITH BREAKFAST  . Multiple Vitamin (MULTIVITAMIN WITH MINERALS) TABS tablet Take 1 tablet by mouth daily.  . naproxen (NAPROSYN) 500 MG tablet TAKE 1 TABLET(500 MG) BY MOUTH TWICE DAILY WITH A MEAL  . oxybutynin (DITROPAN-XL) 10 MG 24 hr tablet TAKE 1 TABLET(10 MG) BY MOUTH AT BEDTIME   No facility-administered encounter medications on file as of 01/14/2020.     Objective:   Goals Addressed            This Visit's Progress     Patient Stated   . PharmD "My medication is expensive" (pt-stated)       Current Barriers:  . Polypharmacy; complex patient with multiple comorbidities including HLD, PVD, T2DM, HTN . Self-manages medications  o HLD: ezetimibe 10 mg daily. Decided to start Repatha therapy d/t uncontrolled LDL on max tolerated statin (none) and ezetimibe  - Report hx statin intolerance d/t myalgias; specific doses unknown, but notes intolerance to atorvastatin, simvastatin, and pravastatin - LDL on therapy 130 - 10 year ASCVD risk 33.5%.  - Hx peripheral vascular disease o ASCVD risk reduction: ASA 81 mg daily  o T2DM: last A1c 7.5%, metformin increased to 1000 mg daily.  o HTN: losartan 50 mg daily, last BP at goal <130/80 o Overactive bladder: oxybutynin XL 10 mg daily  o Depression: escitalopram 10 mg daily o Sciatica: gabapentin 600 mg daily  Pharmacist Clinical Goal(s):  Marland Kitchen Over the next 90 days, patient will work with PharmD and provider towards optimized medication management  Interventions: . [facility] pharmacy, provided insurance information. They confirm a PA is required for the medication. Will collaborate w/ clinical staff on completing this. Once approved, will evaluate copay and pursue [employer]  assistance.   Patient Self Care Activities:  . Patient will take medications as prescribed  Please see past updates related to this goal by clicking on the "Past Updates" button in the selected goal          Plan:  - Will collaborate w/ clinical staff as above  [name], PharmD, [place] [phone]

## 2020-01-14 NOTE — Telephone Encounter (Signed)
Prior Auth for Repatha Sure[employer] 140MG /ML [place] SOAJ initiated and Approved with Cover My Meds.   Reference Number: [id number]

## 2020-01-14 NOTE — Telephone Encounter (Signed)
[place] is faxing over this documentation, but PA is required for Repatha. Could we please work on this?  BIN [id number] PCN [id number] ID [id number] Group [id number]

## 2020-01-14 NOTE — Patient Instructions (Signed)
Visit Information  Goals Addressed            This Visit's Progress     Patient Stated   . PharmD "My medication is expensive" (pt-stated)       Current Barriers:  . Polypharmacy; complex patient with multiple comorbidities including HLD, PVD, T2DM, HTN . Self-manages medications  o HLD: ezetimibe 10 mg daily. Decided to start Repatha therapy d/t uncontrolled LDL on max tolerated statin (none) and ezetimibe  - Report hx statin intolerance d/t myalgias; specific doses unknown, but notes intolerance to atorvastatin, simvastatin, and pravastatin - LDL on therapy 130 - 10 year ASCVD risk 33.5%.  - Hx peripheral vascular disease o ASCVD risk reduction: ASA 81 mg daily  o T2DM: last A1c 7.5%, metformin increased to 1000 mg daily.  o HTN: losartan 50 mg daily, last BP at goal <130/80 o Overactive bladder: oxybutynin XL 10 mg daily  o Depression: escitalopram 10 mg daily o Sciatica: gabapentin 600 mg daily  Pharmacist Clinical Goal(s):  Marland Kitchen Over the next 90 days, patient will work with PharmD and provider towards optimized medication management  Interventions: . [facility] pharmacy, provided insurance information. They confirm a PA is required for the medication. Will collaborate w/ clinical staff on completing this. Once approved, will evaluate copay and pursue [employer] assistance.   Patient Self Care Activities:  . Patient will take medications as prescribed  Please see past updates related to this goal by clicking on the "Past Updates" button in the selected goal         The patient verbalized understanding of instructions provided today and declined a print copy of patient instruction materials.   Plan:  - Will collaborate w/ clinical staff as above  Stacy Mercado, PharmD, [place] [phone]

## 2020-01-14 NOTE — Chronic Care Management (AMB) (Signed)
Chronic Care Management   Follow Up Note   01/14/2020 Name: Stacy Mercado MRN: [id number] DOB: 29-Sep-1946  Referred by: Valerie Roys, DO Reason for referral : Chronic Care Management (Medication Management)   Stacy Mercado is a 74 y.o. year old female who is a primary care patient of Valerie Roys, DO. The CCM team was consulted for assistance with chronic disease management and care coordination needs.  Care coordination completed today.     Review of patient status, including review of consultants reports, relevant laboratory and other test results, and collaboration with appropriate care team members and the patient's provider was performed as part of comprehensive patient evaluation and provision of chronic care management services.    SDOH (Social Determinants of Health) screening performed today: Financial Strain . See Care Plan for related entries.   Outpatient Encounter Medications as of 01/14/2020  Medication Sig  . acetaminophen (TYLENOL) 500 MG tablet Take 1,000 mg every 6 (six) hours as needed by mouth for moderate pain or headache.   Marland Kitchen aspirin EC 325 MG EC tablet Take 1 tablet (325 mg total) by mouth daily. (Patient taking differently: Take 81 mg by mouth daily. )  . baclofen (LIORESAL) 10 MG tablet Take 1 tablet (10 mg total) by mouth at bedtime.  . ciprofloxacin (CIPRO) 500 MG tablet Take 1 tablet (500 mg total) by mouth 2 (two) times daily.  Marland Kitchen escitalopram (LEXAPRO) 10 MG tablet TAKE 1 TABLET(10 MG) BY MOUTH DAILY  . Evolocumab (REPATHA SURECLICK) [id number] MG/ML SOAJ Inject 140 mg into the skin every 14 (fourteen) days.  Marland Kitchen ezetimibe (ZETIA) 10 MG tablet Take 1 tablet (10 mg total) by mouth daily.  Marland Kitchen gabapentin (NEURONTIN) 300 MG capsule Take 1 capsule (300 mg total) by mouth at bedtime.  Marland Kitchen levothyroxine (SYNTHROID, LEVOTHROID) 50 MCG tablet Take 1 tablet (50 mcg total) by mouth daily before breakfast.  . Lidocaine-Menthol (ICY HOT LIDOCAINE PLUS MENTHOL EX) Apply  1 application topically 2 (two) times daily as needed (muscle pain).  Marland Kitchen losartan (COZAAR) 50 MG tablet TAKE 1 TABLET(50 MG) BY MOUTH DAILY  . metFORMIN (GLUCOPHAGE-XR) 500 MG 24 hr tablet TAKE 2 TABLET(1000 MG) BY MOUTH DAILY WITH BREAKFAST  . Multiple Vitamin (MULTIVITAMIN WITH MINERALS) TABS tablet Take 1 tablet by mouth daily.  . naproxen (NAPROSYN) 500 MG tablet TAKE 1 TABLET(500 MG) BY MOUTH TWICE DAILY WITH A MEAL  . oxybutynin (DITROPAN-XL) 10 MG 24 hr tablet TAKE 1 TABLET(10 MG) BY MOUTH AT BEDTIME   No facility-administered encounter medications on file as of 01/14/2020.     Objective:   Goals Addressed            This Visit's Progress     Patient Stated   . PharmD "My medication is expensive" (pt-stated)       Current Barriers:  . Polypharmacy; complex patient with multiple comorbidities including HLD, PVD, T2DM, HTN o Sent Repatha. PA approved. Patient called me today to let me know she received a call from [facility] that cost was >$400  . Self-manages medications  o HLD: ezetimibe 10 mg daily. Decided to start Repatha therapy d/t uncontrolled LDL on max tolerated statin (none) and ezetimibe  - Report hx statin intolerance d/t myalgias; specific doses unknown, but notes intolerance to atorvastatin, simvastatin, and pravastatin - LDL on therapy 130 - 10 year ASCVD risk 33.5%.  - Hx peripheral vascular disease o ASCVD risk reduction: ASA 81 mg daily  o T2DM: last A1c 7.5%, metformin increased  to 1000 mg daily.  o HTN: losartan 50 mg daily, last BP at goal <130/80 o Overactive bladder: oxybutynin XL 10 mg daily  o Depression: escitalopram 10 mg daily o Sciatica: gabapentin 600 mg daily  Pharmacist Clinical Goal(s):  Marland Kitchen Over the next 90 days, patient will work with PharmD and provider towards optimized medication management  Interventions: . Contacted patient. Discussed Multimedia programmer for [employer]. She will contact them tomorrow.  . [place]. Repatha went through insurance, but is $421.27 for a 28 day supply due to her insurance deductible. If approved, she will get [employer] funding that should help pay the deductible, but once deductible is paid, copay will be more manageable and [employer] funding will cover therapy for the year  Patient Self Care Activities:  . Patient will take medications as prescribed  Please see past updates related to this goal by clicking on the "Past Updates" button in the selected goal          Plan:  - Will collaborate w/ team as above  [name], PharmD, [place] [phone]

## 2020-01-15 ENCOUNTER — Ambulatory Visit: Payer: Self-pay | Admitting: Pharmacist

## 2020-01-15 DIAGNOSIS — E782 Mixed hyperlipidemia: Secondary | ICD-10-CM | POA: Diagnosis not present

## 2020-01-15 DIAGNOSIS — E119 Type 2 diabetes mellitus without complications: Secondary | ICD-10-CM | POA: Diagnosis not present

## 2020-01-15 DIAGNOSIS — I129 Hypertensive chronic kidney disease with stage 1 through stage 4 chronic kidney disease, or unspecified chronic kidney disease: Secondary | ICD-10-CM | POA: Diagnosis not present

## 2020-01-15 NOTE — Patient Instructions (Signed)
Visit Information  Goals Addressed            This Visit's Progress     Patient Stated   . PharmD "My medication is expensive" (pt-stated)       Current Barriers:  . Polypharmacy; complex patient with multiple comorbidities including HLD, PVD, T2DM, HTN o Sent Repatha. PA approved. Patient called me today to let me know she received a call from [facility] that cost was >$400. Discussed [employer] Funding . Self-manages medications  o HLD: ezetimibe 10 mg daily. Decided to start Repatha therapy d/t uncontrolled LDL on max tolerated statin (none) and ezetimibe  - Report hx statin intolerance d/t myalgias; specific doses unknown, but notes intolerance to atorvastatin, simvastatin, and pravastatin - LDL on therapy 130 - 10 year ASCVD risk 33.5%.  - Hx peripheral vascular disease o ASCVD risk reduction: ASA 81 mg daily  o T2DM: last A1c 7.5%, metformin increased to 1000 mg daily.  o HTN: losartan 50 mg daily, last BP at goal <130/80 o Overactive bladder: oxybutynin XL 10 mg daily  o Depression: escitalopram 10 mg daily o Sciatica: gabapentin 600 mg daily  Pharmacist Clinical Goal(s):  Marland Kitchen Over the next 90 days, patient will work with PharmD and provider towards optimized medication management  Interventions: . Received call from patient. She was APPROVED for funding through [employer] for $2500 this year towards copay on Repatha. As previously noted, this should help her pay through her deductible and then be able to cover copays for the year. Patient is awaiting the card information in the mail, and will take this to the pharmacy. Once she has picked up the medication, she will call me and we will schedule a time for her to come into the office for first dose demonstration.   Patient Self Care Activities:  . Patient will take medications as prescribed  Please see past updates related to this goal by clicking on the "Past Updates" button in the selected goal         The  patient verbalized understanding of instructions provided today and declined a print copy of patient instruction materials.   Plan:  - Will await call from patient that she has been able to fill and pick up [place].   [name], PharmD, [place] [phone]

## 2020-01-15 NOTE — Chronic Care Management (AMB) (Signed)
Chronic Care Management   Follow Up Note   01/15/2020 Name: Stacy Mercado MRN: [id number] DOB: 08/21/1946  Referred by: Valerie Roys, DO Reason for referral : Chronic Care Management (Medication Management)   Stacy Mercado is a 73 y.o. year old female who is a primary care patient of Valerie Roys, DO. The CCM team was consulted for assistance with chronic disease management and care coordination needs.    Received call from patient today with updates.   Review of patient status, including review of consultants reports, relevant laboratory and other test results, and collaboration with appropriate care team members and the patient's provider was performed as part of comprehensive patient evaluation and provision of chronic care management services.    SDOH (Social Determinants of Health) screening performed today: Financial Strain . See Care Plan for related entries.   Outpatient Encounter Medications as of 01/15/2020  Medication Sig  . acetaminophen (TYLENOL) 500 MG tablet Take 1,000 mg every 6 (six) hours as needed by mouth for moderate pain or headache.   Marland Kitchen aspirin EC 325 MG EC tablet Take 1 tablet (325 mg total) by mouth daily. (Patient taking differently: Take 81 mg by mouth daily. )  . baclofen (LIORESAL) 10 MG tablet Take 1 tablet (10 mg total) by mouth at bedtime.  . ciprofloxacin (CIPRO) 500 MG tablet Take 1 tablet (500 mg total) by mouth 2 (two) times daily.  Marland Kitchen escitalopram (LEXAPRO) 10 MG tablet TAKE 1 TABLET(10 MG) BY MOUTH DAILY  . Evolocumab (REPATHA SURECLICK) XX123456 MG/ML SOAJ Inject 140 mg into the skin every 14 (fourteen) days.  Marland Kitchen ezetimibe (ZETIA) 10 MG tablet Take 1 tablet (10 mg total) by mouth daily.  Marland Kitchen gabapentin (NEURONTIN) 300 MG capsule Take 1 capsule (300 mg total) by mouth at bedtime.  Marland Kitchen levothyroxine (SYNTHROID, LEVOTHROID) 50 MCG tablet Take 1 tablet (50 mcg total) by mouth daily before breakfast.  . Lidocaine-Menthol (ICY HOT LIDOCAINE PLUS  MENTHOL EX) Apply 1 application topically 2 (two) times daily as needed (muscle pain).  Marland Kitchen losartan (COZAAR) 50 MG tablet TAKE 1 TABLET(50 MG) BY MOUTH DAILY  . metFORMIN (GLUCOPHAGE-XR) 500 MG 24 hr tablet TAKE 2 TABLET(1000 MG) BY MOUTH DAILY WITH BREAKFAST  . Multiple Vitamin (MULTIVITAMIN WITH MINERALS) TABS tablet Take 1 tablet by mouth daily.  . naproxen (NAPROSYN) 500 MG tablet TAKE 1 TABLET(500 MG) BY MOUTH TWICE DAILY WITH A MEAL  . oxybutynin (DITROPAN-XL) 10 MG 24 hr tablet TAKE 1 TABLET(10 MG) BY MOUTH AT BEDTIME   No facility-administered encounter medications on file as of 01/15/2020.     Objective:   Goals Addressed            This Visit's Progress     Patient Stated   . PharmD "My medication is expensive" (pt-stated)       Current Barriers:  . Polypharmacy; complex patient with multiple comorbidities including HLD, PVD, T2DM, HTN o Sent Repatha. PA approved. Patient called me today to let me know she received a call from [facility] that cost was >$400. Discussed [employer] Funding . Self-manages medications  o HLD: ezetimibe 10 mg daily. Decided to start Repatha therapy d/t uncontrolled LDL on max tolerated statin (none) and ezetimibe  - Report hx statin intolerance d/t myalgias; specific doses unknown, but notes intolerance to atorvastatin, simvastatin, and pravastatin - LDL on therapy 130 - 10 year ASCVD risk 33.5%.  - Hx peripheral vascular disease o ASCVD risk reduction: ASA 81 mg daily  o T2DM:  last A1c 7.5%, metformin increased to 1000 mg daily.  o HTN: losartan 50 mg daily, last BP at goal <130/80 o Overactive bladder: oxybutynin XL 10 mg daily  o Depression: escitalopram 10 mg daily o Sciatica: gabapentin 600 mg daily  Pharmacist Clinical Goal(s):  Marland Kitchen Over the next 90 days, patient will work with PharmD and provider towards optimized medication management  Interventions: . Received call from patient. She was APPROVED for funding through [employer]  for $2500 this year towards copay on Repatha. As previously noted, this should help her pay through her deductible and then be able to cover copays for the year. Patient is awaiting the card information in the mail, and will take this to the pharmacy. Once she has picked up the medication, she will call me and we will schedule a time for her to come into the office for first dose demonstration.   Patient Self Care Activities:  . Patient will take medications as prescribed  Please see past updates related to this goal by clicking on the "Past Updates" button in the selected goal          Plan:  - Will await call from patient that she has been able to fill and pick up [name].   [name], PharmD, [place] [phone]

## 2020-01-16 ENCOUNTER — Other Ambulatory Visit: Payer: Self-pay | Admitting: Family Medicine

## 2020-01-16 NOTE — Telephone Encounter (Signed)
Requested medication (s) are due for refill today: yes  Requested medication (s) are on the active medication list: yes  Last refill: 01/09/20  see notes   Future visit scheduled: No  Notes to clinic:  See notes of visit for med management. Have all criteria been met per note? Send to [facility] [address] [place]    Requested Prescriptions  Pending Prescriptions Disp Refills   Evolocumab (REPATHA SURECLICK) 709 MG/ML SOAJ 2 mL 6    Sig: Inject 140 mg into the skin every 14 (fourteen) days.      Cardiovascular: PCSK9 Inhibitors Failed - 01/16/2020  4:29 PM      Failed - Total Cholesterol in normal range and within 360 days    Cholesterol, Total  Date Value Ref Range Status  01/02/2020 218 (H) 100 - 199 mg/dL Final   Cholesterol [place], Waived  Date Value Ref Range Status  08/14/2016 211 (H) <200 mg/dL Final    Comment:                            Desirable                <200                         Borderline High      200- 239                         High                     >239           Failed - LDL in normal range and within 360 days    LDL Chol Calc (NIH)  Date Value Ref Range Status  01/02/2020 130 (H) 0 - 99 mg/dL Final          Failed - HDL in normal range and within 360 days    HDL  Date Value Ref Range Status  01/02/2020 34 (L) >39 mg/dL Final          Failed - Triglycerides in normal range and within 360 days    Triglycerides  Date Value Ref Range Status  01/02/2020 301 (H) 0 - 149 mg/dL Final   Triglycerides Piccolo,Waived  Date Value Ref Range Status  08/14/2016 226 (H) <150 mg/dL Final    Comment:                            Normal                   <150                         Borderline High     150 - 199                         High                200 - 499                         Very High                >499  Passed - Valid encounter within last 12 months    Recent Outpatient Visits           2 weeks ago Controlled type 2  diabetes mellitus without complication, without long-term current use of insulin ([place])   [place], Megan P, DO   6 months ago Patient left without being seen   [facility] [place], Megan P, DO   9 months ago PVD (peripheral vascular disease) [facility])   [place], [place], DO   1 year ago Benign hypertensive renal disease   [facility] [place], [place], DO   1 year ago Subclinical hypothyroidism   [facility] Valerie Roys, DO       Future Appointments             In 2 months Wynetta Emery, Barb Merino, DO [place], [place]   In 73 months  [employer], [facility]

## 2020-01-16 NOTE — Telephone Encounter (Signed)
Medication Refill - Medication: Evolocumab (REPATHA SURECLICK) XX123456 MG/ML SOAJ    Preferred Pharmacy (with phone number or street name):  [facility] [id number], [place] Phone:  [phone]  Fax:  [phone]       Agent: Please be advised that RX refills may take up to 3 business days. We ask that you follow-up with your pharmacy.

## 2020-01-16 NOTE — Telephone Encounter (Signed)
Misunderstanding about where she wanted the prescription, can you please send to [facility] instead of Stacy Mercado? Thanks!

## 2020-01-16 NOTE — Telephone Encounter (Signed)
Please send to [facility]

## 2020-01-17 MED ORDER — REPATHA SURECLICK 140 MG/ML ~~LOC~~ SOAJ: 140.0000 mg | SUBCUTANEOUS | 6 refills | Status: DC

## 2020-01-23 ENCOUNTER — Ambulatory Visit: Payer: Self-pay | Admitting: Pharmacist

## 2020-01-23 NOTE — Chronic Care Management (AMB) (Signed)
  Chronic Care Management   Note  01/23/2020 Name: Stacy Mercado MRN: [id number] DOB: 1946/12/10  [name] is a 74 y.o. year old female who is a primary care patient of Valerie Roys, DO. The CCM team was consulted for assistance with chronic disease management and care coordination needs.    Received call from patient that she plans to fill Repatha on 02/04/20, requested an appointment for injection demonstration. I have scheduled an appointment with me on 02/04/20 at 1 pm. Patient aware.   [name], PharmD, [place] [phone]

## 2020-01-30 ENCOUNTER — Ambulatory Visit: Payer: Medicare Other | Admitting: Pharmacist

## 2020-01-30 DIAGNOSIS — E782 Mixed hyperlipidemia: Secondary | ICD-10-CM

## 2020-01-30 DIAGNOSIS — I739 Peripheral vascular disease, unspecified: Secondary | ICD-10-CM

## 2020-01-30 DIAGNOSIS — M5432 Sciatica, left side: Secondary | ICD-10-CM

## 2020-01-30 NOTE — Patient Instructions (Signed)
Visit Information  Goals Addressed            This Visit's Progress     Patient Stated   . PharmD "My medication is expensive" (pt-stated)       Current Barriers:  . Polypharmacy; complex patient with multiple comorbidities including HLD, PVD . Self-manages medications  o HLD: ezetimibe 10 mg daily. Decided to start Repatha therapy d/t uncontrolled LDL on max tolerated statin (none) and ezetimibe  - Report hx statin intolerance d/t myalgias; specific doses unknown, but notes intolerance to atorvastatin, simvastatin, and pravastatin - LDL on therapy 130 - 10 year ASCVD risk 33.5%.  - Hx peripheral vascular disease o ASCVD risk reduction: ASA 81 mg daily  o T2DM: last A1c 7.5%, metformin increased to 1000 mg daily.  o HTN: losartan 50 mg daily, last BP at goal <130/80 o Overactive bladder: oxybutynin XL 10 mg daily  o Depression: escitalopram 10 mg daily o Sciatica: gabapentin 600 mg daily - called today to ask if OTC Voltaren gel would be appropriate for her  Pharmacist Clinical Goal(s):  Marland Kitchen Over the next 90 days, patient will work with PharmD and provider towards optimized medication management  Interventions: . Reviewed Voltaren use for her reported knee arthritis pain. Counseled that topical NSAIDs are a good option for this.   Patient Self Care Activities:  . Patient will take medications as prescribed  Please see past updates related to this goal by clicking on the "Past Updates" button in the selected goal         The patient verbalized understanding of instructions provided today and declined a print copy of patient instruction materials.  Plan:  - Will meet with patient face to face next week for Repatha demonstration  [name], PharmD, [place] [phone]

## 2020-01-30 NOTE — Chronic Care Management (AMB) (Signed)
Chronic Care Management   Follow Up Note   01/30/2020 Name: Stacy Mercado MRN: [id number] DOB: 1946-10-05  Referred by: Valerie Roys, DO Reason for referral : Chronic Care Management (Medication Management)   Stacy Mercado is a 74 y.o. year old female who is a primary care patient of Valerie Roys, DO. The CCM team was consulted for assistance with chronic disease management and care coordination needs.    Contacted patient as previously scheduled.  Review of patient status, including review of consultants reports, relevant laboratory and other test results, and collaboration with appropriate care team members and the patient's provider was performed as part of comprehensive patient evaluation and provision of chronic care management services.    SDOH (Social Determinants of Health) screening performed today: Financial Strain . See Care Plan for related entries.   Outpatient Encounter Medications as of 01/30/2020  Medication Sig  . acetaminophen (TYLENOL) 500 MG tablet Take 1,000 mg every 6 (six) hours as needed by mouth for moderate pain or headache.   Marland Kitchen aspirin EC 325 MG EC tablet Take 1 tablet (325 mg total) by mouth daily. (Patient taking differently: Take 81 mg by mouth daily. )  . baclofen (LIORESAL) 10 MG tablet Take 1 tablet (10 mg total) by mouth at bedtime.  . ciprofloxacin (CIPRO) 500 MG tablet Take 1 tablet (500 mg total) by mouth 2 (two) times daily.  Marland Kitchen escitalopram (LEXAPRO) 10 MG tablet TAKE 1 TABLET(10 MG) BY MOUTH DAILY  . Evolocumab (REPATHA SURECLICK) XX123456 MG/ML SOAJ Inject 140 mg into the skin every 14 (fourteen) days.  Marland Kitchen ezetimibe (ZETIA) 10 MG tablet Take 1 tablet (10 mg total) by mouth daily.  Marland Kitchen gabapentin (NEURONTIN) 300 MG capsule Take 1 capsule (300 mg total) by mouth at bedtime.  Marland Kitchen levothyroxine (SYNTHROID, LEVOTHROID) 50 MCG tablet Take 1 tablet (50 mcg total) by mouth daily before breakfast.  . Lidocaine-Menthol (ICY HOT LIDOCAINE PLUS MENTHOL  EX) Apply 1 application topically 2 (two) times daily as needed (muscle pain).  Marland Kitchen losartan (COZAAR) 50 MG tablet TAKE 1 TABLET(50 MG) BY MOUTH DAILY  . metFORMIN (GLUCOPHAGE-XR) 500 MG 24 hr tablet TAKE 2 TABLET(1000 MG) BY MOUTH DAILY WITH BREAKFAST  . Multiple Vitamin (MULTIVITAMIN WITH MINERALS) TABS tablet Take 1 tablet by mouth daily.  . naproxen (NAPROSYN) 500 MG tablet TAKE 1 TABLET(500 MG) BY MOUTH TWICE DAILY WITH A MEAL  . oxybutynin (DITROPAN-XL) 10 MG 24 hr tablet TAKE 1 TABLET(10 MG) BY MOUTH AT BEDTIME   No facility-administered encounter medications on file as of 01/30/2020.     Objective:   Goals Addressed            This Visit's Progress     Patient Stated   . PharmD "My medication is expensive" (pt-stated)       Current Barriers:  . Polypharmacy; complex patient with multiple comorbidities including HLD, PVD . Self-manages medications  o HLD: ezetimibe 10 mg daily. Decided to start Repatha therapy d/t uncontrolled LDL on max tolerated statin (none) and ezetimibe  - Report hx statin intolerance d/t myalgias; specific doses unknown, but notes intolerance to atorvastatin, simvastatin, and pravastatin - LDL on therapy 130 - 10 year ASCVD risk 33.5%.  - Hx peripheral vascular disease o ASCVD risk reduction: ASA 81 mg daily  o T2DM: last A1c 7.5%, metformin increased to 1000 mg daily.  o HTN: losartan 50 mg daily, last BP at goal <130/80 o Overactive bladder: oxybutynin XL 10 mg daily  o  Depression: escitalopram 10 mg daily o Sciatica: gabapentin 600 mg daily - called today to ask if OTC Voltaren gel would be appropriate for her  Pharmacist Clinical Goal(s):  Marland Kitchen Over the next 90 days, patient will work with PharmD and provider towards optimized medication management  Interventions: . Reviewed Voltaren use for her reported knee arthritis pain. Counseled that topical NSAIDs are a good option for this.   Patient Self Care Activities:  . Patient will take medications  as prescribed  Please see past updates related to this goal by clicking on the "Past Updates" button in the selected goal          Plan:  - Will meet with patient face to face next week for Repatha demonstration  [name], PharmD, [place] [phone]

## 2020-02-04 ENCOUNTER — Ambulatory Visit: Payer: Self-pay

## 2020-02-24 ENCOUNTER — Telehealth: Payer: Self-pay | Admitting: Family Medicine

## 2020-02-24 NOTE — Telephone Encounter (Signed)
I believe that sent paperwork, so it should be in your folder.

## 2020-02-24 NOTE — Telephone Encounter (Signed)
I don;t understand this- they need an Rx for the autoinjector or the med?

## 2020-02-24 NOTE — Telephone Encounter (Signed)
Routing to provider  

## 2020-02-24 NOTE — Telephone Encounter (Signed)
[facility] stated pt needs a replacement  Repatha 140mg  Sure click auto injector. The manufacturer will replace it for free. They need a rx from Dr. Wynetta Emery sent to  Fax [phone] Phone# [phone]

## 2020-03-02 ENCOUNTER — Other Ambulatory Visit: Payer: Self-pay

## 2020-03-02 ENCOUNTER — Ambulatory Visit
Admission: RE | Admit: 2020-03-02 | Discharge: 2020-03-02 | Disposition: A | Discharge status: Home / Self Care | Payer: Medicare Other | Source: Ambulatory Visit | Attending: Urology | Admitting: Urology

## 2020-03-02 DIAGNOSIS — Z8551 Personal history of malignant neoplasm of bladder: Secondary | ICD-10-CM

## 2020-03-02 DIAGNOSIS — N2889 Other specified disorders of kidney and ureter: Secondary | ICD-10-CM | POA: Diagnosis not present

## 2020-03-03 ENCOUNTER — Encounter: Payer: Self-pay | Admitting: Urology

## 2020-03-03 ENCOUNTER — Other Ambulatory Visit: Payer: Medicare Other | Admitting: Urology

## 2020-03-04 NOTE — Progress Notes (Signed)
   03/05/20  CC:  Chief Complaint  Patient presents with  . Cysto    HPI: Stacy Mercado is a 74 y.o. who returns to the office today for cystoscopy and f/u RUS.   See associated office visit note from today.  History of bladder cancer Records review, it appears that she was diagnosed with bladder tumors (low grade superficial) in October 2015 abd  March 2016. She underwent induction BCG completed in March 2016. as well as maintenance with cytology and was followed with cystoscopy every 3 months.  Her last BCG maintenance BCG was in 11/2015.    There were no vitals taken for this visit. NED. A&Ox3.   No respiratory distress   Abd soft, NT, ND Normal external genitalia with patent urethral meatus  Cystoscopy Procedure Note  Patient identification was confirmed, informed consent was obtained, and patient was prepped using Betadine solution.  Lidocaine jelly was administered per urethral meatus.    Procedure: - Flexible cystoscope introduced, descent of bladder requring digital manipulation to access bladder neck mucosa otherwise unremarkable. - Thorough search of the bladder revealed:    normal urethral meatus    normal urothelium    no stones    no ulcers      no tumors     no urethral polyps    no trabeculation cystocele apprecitate to level of entritis.       - Ureteral orifices were normal in position and appearance.  Post-Procedure: - Patient tolerated the procedure well  Pertinent Imagings: CLINICAL DATA:  Renal mass, history of bladder cancer no surgical intervention or cryoablation  EXAM: RENAL / URINARY TRACT ULTRASOUND COMPLETE  COMPARISON:  Ultrasound 02/14/2019, CT 12/20/2016  FINDINGS: Right Kidney:  Renal measurements: 12.1 x 5.0 x 6.3 = volume: 200 mL. There is a 2.9 x 2.7 x 2.4 cm partially exophytic lesion intermediate echogenicity, slightly hypoechoic to the normal renal parenchyma with internal color vascularity. Appears enlarged from  prior exam with there is previously measured 2.0 x 2.2 x 2.0 cm. No other renal lesions. No hydronephrosis or shadowing calculus.  Left Kidney:  Renal measurements: 11.0 x 5.9 x 5.4 = volume: 183 mL. Echogenicity within normal limits. No mass, shadowing calculi or hydronephrosis visualized.  Bladder:  Poorly distended and therefore suboptimally assessed by ultrasound.  Other:  None.  IMPRESSION: Enlarging, exophytic right renal lesion. Finding is highly concerning for malignancy and should be considered a renal cell carcinoma till proven otherwise.  These results will be called to the ordering clinician or representative by the Radiologist Assistant, and communication documented in the PACS or [employer].   Electronically Signed   By: Lovena Le M.D.   On: 03/03/2020 01:09  I have personally reviewed the images and agree with radiologist interpretation.   Assessment/ Plan:  1. 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  Hollice Espy, MD

## 2020-03-05 ENCOUNTER — Ambulatory Visit (INDEPENDENT_AMBULATORY_CARE_PROVIDER_SITE_OTHER): Payer: Medicare Other | Admitting: Urology

## 2020-03-05 ENCOUNTER — Encounter: Payer: Self-pay | Admitting: Urology

## 2020-03-05 ENCOUNTER — Other Ambulatory Visit: Payer: Self-pay

## 2020-03-05 ENCOUNTER — Other Ambulatory Visit
Admission: RE | Admit: 2020-03-05 | Discharge: 2020-03-05 | Disposition: A | Discharge status: Home / Self Care | Payer: Medicare Other | Attending: Urology | Admitting: Urology

## 2020-03-05 VITALS — BP 149/74 | HR 76 | Ht 65.0 in | Wt 200.0 lb

## 2020-03-05 DIAGNOSIS — N2889 Other specified disorders of kidney and ureter: Secondary | ICD-10-CM | POA: Diagnosis not present

## 2020-03-05 DIAGNOSIS — Z8551 Personal history of malignant neoplasm of bladder: Secondary | ICD-10-CM | POA: Insufficient documentation

## 2020-03-05 LAB — URINALYSIS, COMPLETE (UACMP) WITH MICROSCOPIC
Bilirubin Urine: NEGATIVE
Glucose, UA: NEGATIVE mg/dL
Hgb urine dipstick: NEGATIVE
Ketones, ur: NEGATIVE mg/dL
Leukocytes,Ua: NEGATIVE
Nitrite: NEGATIVE
Protein, ur: NEGATIVE mg/dL
RBC / HPF: NONE SEEN RBC/hpf (ref 0–5)
Specific Gravity, Urine: 1.025 (ref 1.005–1.030)
pH: 5.5 (ref 5.0–8.0)
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
## 2020-03-09 ENCOUNTER — Ambulatory Visit (INDEPENDENT_AMBULATORY_CARE_PROVIDER_SITE_OTHER): Payer: Medicare Other | Admitting: Pharmacist

## 2020-03-09 DIAGNOSIS — E782 Mixed hyperlipidemia: Secondary | ICD-10-CM

## 2020-03-09 DIAGNOSIS — E119 Type 2 diabetes mellitus without complications: Secondary | ICD-10-CM

## 2020-03-09 DIAGNOSIS — I739 Peripheral vascular disease, unspecified: Secondary | ICD-10-CM

## 2020-03-09 NOTE — Chronic Care Management (AMB) (Signed)
Chronic Care Management   Follow Up Note   03/09/2020 Name: Stacy Mercado MRN: [id number] DOB: Aug 04, 1946  Referred by: Valerie Roys, DO Reason for referral : Chronic Care Management (Medication Management)   Stacy Mercado is a 74 y.o. year old female who is a primary care patient of Valerie Roys, DO. The CCM team was consulted for assistance with chronic disease management and care coordination needs.    Contacted patient for medication management review.  Review of patient status, including review of consultants reports, relevant laboratory and other test results, and collaboration with appropriate care team members and the patient's provider was performed as part of comprehensive patient evaluation and provision of chronic care management services.    SDOH (Social Determinants of Health) assessments performed: Yes See Care Plan activities for detailed interventions related to [facility])     Outpatient Encounter Medications as of 03/09/2020  Medication Sig Note  . acetaminophen (TYLENOL) 500 MG tablet Take 1,000 mg every 6 (six) hours as needed by mouth for moderate pain or headache.    Marland Kitchen aspirin 81 MG chewable tablet Chew 81 mg by mouth daily.   . baclofen (LIORESAL) 10 MG tablet Take 1 tablet (10 mg total) by mouth at bedtime. 03/09/2020: PRN   . escitalopram (LEXAPRO) 10 MG tablet TAKE 1 TABLET(10 MG) BY MOUTH DAILY   . Evolocumab (REPATHA SURECLICK) XX123456 MG/ML SOAJ Inject 140 mg into the skin every 14 (fourteen) days.   Marland Kitchen gabapentin (NEURONTIN) 300 MG capsule Take 1 capsule (300 mg total) by mouth at bedtime.   Marland Kitchen levothyroxine (SYNTHROID, LEVOTHROID) 50 MCG tablet Take 1 tablet (50 mcg total) by mouth daily before breakfast.   . losartan (COZAAR) 50 MG tablet TAKE 1 TABLET(50 MG) BY MOUTH DAILY   . metFORMIN (GLUCOPHAGE-XR) 500 MG 24 hr tablet TAKE 2 TABLET(1000 MG) BY MOUTH DAILY WITH BREAKFAST 03/09/2020: 1 tab BID  . Multiple Vitamin (MULTIVITAMIN WITH MINERALS) TABS  tablet Take 1 tablet by mouth daily.   . naproxen (NAPROSYN) 500 MG tablet TAKE 1 TABLET(500 MG) BY MOUTH TWICE DAILY WITH A MEAL   . oxybutynin (DITROPAN-XL) 10 MG 24 hr tablet TAKE 1 TABLET(10 MG) BY MOUTH AT BEDTIME   . [DISCONTINUED] aspirin EC 325 MG EC tablet Take 1 tablet (325 mg total) by mouth daily. (Patient taking differently: Take 81 mg by mouth daily. )   . Lidocaine-Menthol (ICY HOT LIDOCAINE PLUS MENTHOL EX) Apply 1 application topically 2 (two) times daily as needed (muscle pain).    No facility-administered encounter medications on file as of 03/09/2020.     Objective:   Goals Addressed            This Visit's Progress     Patient Stated   . PharmD "My medication is expensive" (pt-stated)       CARE PLAN ENTRY (see longtitudinal plan of care for additional care plan information)  Current Barriers:  . Polypharmacy; complex patient with multiple comorbidities including HLD, PVD . Self-manages medications  o HLD: Repatha 140 mg Q2w. Notes she has done 2 injections thus far. Notes that the first injection stung, but she doesn't think she let it warm up enough. Second injection sat out overnight as recommended and she had no problems with tolerability - Report hx statin intolerance d/t myalgias; specific doses unknown, but notes intolerance to atorvastatin, simvastatin, and pravastatin - LDL on simvastatin therapy 130 (baseline) - 10 year ASCVD risk 33.5%.  - Hx peripheral vascular disease o  ASCVD risk reduction: ASA 81 mg daily  o T2DM: last A1c 7.5%, metformin increased to 1000 mg daily; taking 500 mg BID o HTN: losartan 50 mg daily, last BP at goal <130/80 o Overactive bladder/hx bladder cancer: oxybutynin XL 10 mg daily; reports positive f/u appt w/ Dr. Erlene Quan earlier this month, will f/u in ~4 months on renal mass o Depression: escitalopram 10 mg daily o Sciatica: gabapentin 600 mg daily, baclofen 10 mg QPM PRN, naproxen 500 mg BID - requests refill on this  today  Pharmacist Clinical Goal(s):  Marland Kitchen Over the next 90 days, patient will work with PharmD and provider towards optimized medication management  Interventions: . Comprehensive medication review performed, medication list updated in electronic medical record . Congratulated for tolerability and adherence to Repatha injection. Looking forward to updated lipid panel next month w/ PCP. Reviewed that Repatha funding should carry through the rest of the year approval period, then we can re-investigate open funding options in a year's time if still on therapy . Will communicate patient's request for naproxen refill to Dr. Wynetta Emery. Encouraged continued monitoring of renal fx and for any issues w/ GI irritation w/ chronic NSAID tx.   Patient Self Care Activities:  . Patient will take medications as prescribed  Please see past updates related to this goal by clicking on the "Past Updates" button in the selected goal          Plan:  - Scheduled f/u call 04/20/20  [name], PharmD, [place] [phone]

## 2020-03-09 NOTE — Patient Instructions (Signed)
Visit Information  Goals Addressed            This Visit's Progress     Patient Stated   . PharmD "My medication is expensive" (pt-stated)       CARE PLAN ENTRY (see longtitudinal plan of care for additional care plan information)  Current Barriers:  . Polypharmacy; complex patient with multiple comorbidities including HLD, PVD . Self-manages medications  o HLD: Repatha 140 mg Q2w. Notes she has done 2 injections thus far. Notes that the first injection stung, but she doesn't think she let it warm up enough. Second injection sat out overnight as recommended and she had no problems with tolerability - Report hx statin intolerance d/t myalgias; specific doses unknown, but notes intolerance to atorvastatin, simvastatin, and pravastatin - LDL on simvastatin therapy 130 (baseline) - 10 year ASCVD risk 33.5%.  - Hx peripheral vascular disease o ASCVD risk reduction: ASA 81 mg daily  o T2DM: last A1c 7.5%, metformin increased to 1000 mg daily; taking 500 mg BID o HTN: losartan 50 mg daily, last BP at goal <130/80 o Overactive bladder/hx bladder cancer: oxybutynin XL 10 mg daily; reports positive f/u appt w/ Dr. Erlene Quan earlier this month, will f/u in ~4 months on renal mass o Depression: escitalopram 10 mg daily o Sciatica: gabapentin 600 mg daily, baclofen 10 mg QPM PRN, naproxen 500 mg BID - requests refill on this today  Pharmacist Clinical Goal(s):  Marland Kitchen Over the next 90 days, patient will work with PharmD and provider towards optimized medication management  Interventions: . Comprehensive medication review performed, medication list updated in electronic medical record . Congratulated for tolerability and adherence to Repatha injection. Looking forward to updated lipid panel next month w/ PCP. Reviewed that Repatha funding should carry through the rest of the year approval period, then we can re-investigate open funding options in a year's time if still on therapy . Will communicate  patient's request for naproxen refill to Dr. Wynetta Emery. Encouraged continued monitoring of renal fx and for any issues w/ GI irritation w/ chronic NSAID tx.   Patient Self Care Activities:  . Patient will take medications as prescribed  Please see past updates related to this goal by clicking on the "Past Updates" button in the selected goal         Patient verbalizes understanding of instructions provided today.   Plan:  - Scheduled f/u call 04/20/20  [name], PharmD, [place] [phone]

## 2020-03-10 ENCOUNTER — Other Ambulatory Visit: Payer: Self-pay | Admitting: Family Medicine

## 2020-03-29 ENCOUNTER — Telehealth: Payer: Self-pay | Admitting: Family Medicine

## 2020-03-29 NOTE — Telephone Encounter (Signed)
Pt called to check on this states that she only has 8 pills left and her bottle says no refills.  Pharmacy also has no refills on file for her.

## 2020-03-29 NOTE — Telephone Encounter (Signed)
Patient notified to call and speak with pharmacy and ask if she has a prescription on hold.

## 2020-04-01 ENCOUNTER — Encounter: Payer: Self-pay | Admitting: Family Medicine

## 2020-04-01 ENCOUNTER — Ambulatory Visit (INDEPENDENT_AMBULATORY_CARE_PROVIDER_SITE_OTHER): Payer: Medicare Other | Admitting: Family Medicine

## 2020-04-01 ENCOUNTER — Other Ambulatory Visit: Payer: Self-pay

## 2020-04-01 VITALS — BP 143/72 | HR 72 | Temp 97.0°F | Ht 64.57 in | Wt 197.0 lb

## 2020-04-01 DIAGNOSIS — E119 Type 2 diabetes mellitus without complications: Secondary | ICD-10-CM

## 2020-04-01 LAB — BAYER DCA HB A1C WAIVED: HB A1C (BAYER DCA - WAIVED): 7.5 % — ABNORMAL HIGH (ref <7.0)

## 2020-04-01 MED ORDER — METFORMIN HCL ER 500 MG PO TB24: 1000.0000 mg | ORAL_TABLET | Freq: Two times a day (BID) | ORAL | 1 refills | Status: DC

## 2020-04-01 NOTE — Progress Notes (Addendum)
BP (!) 143/72 (BP Location: Left Arm, Patient Position: Sitting, Cuff Size: Normal)   Pulse 72   Temp (!) 97 F (36.1 C) (Oral)   Ht 5' 4.57" (1.64 m)   Wt 197 lb (89.4 kg)   SpO2 92%   BMI 33.22 kg/m    Subjective:    Patient ID: Stacy Mercado, female    DOB: [birth year], 74 y.o.   MRN: [id number]  HPI: EVEE ABUNDIZ is a 74 y.o. female  Chief Complaint  Patient presents with  . Diabetes   DIABETES Hypoglycemic episodes:no Polydipsia/polyuria: no Visual disturbance: no Chest pain: no Paresthesias: no Glucose Monitoring: no  Accucheck frequency: Not Checking Taking Insulin?: no Blood Pressure Monitoring: not checking Retinal Examination: Not up to Date Foot Exam: Not up to Date Diabetic Education: Completed Pneumovax: Up to Date Influenza: Not up to Date Aspirin: yes  Relevant past medical, surgical, family and social history reviewed and updated as indicated. Interim medical history since our last visit reviewed. Allergies and medications reviewed and updated.  Review of Systems  Constitutional: Positive for fatigue. Negative for activity change, appetite change, chills, diaphoresis, fever and unexpected weight change.  Respiratory: Negative.   Cardiovascular: Negative.   Gastrointestinal: Negative.   Musculoskeletal: Negative.   Psychiatric/Behavioral: Negative.     Per HPI unless specifically indicated above     Objective:    BP (!) 143/72 (BP Location: Left Arm, Patient Position: Sitting, Cuff Size: Normal)   Pulse 72   Temp (!) 97 F (36.1 C) (Oral)   Ht 5' 4.57" (1.64 m)   Wt 197 lb (89.4 kg)   SpO2 92%   BMI 33.22 kg/m   Wt Readings from Last 3 Encounters:  04/01/20 197 lb (89.4 kg)  03/05/20 200 lb (90.7 kg)  01/07/20 200 lb 9.6 oz (91 kg)    Physical Exam Vitals and nursing note reviewed.  Constitutional:      General: She is not in acute distress.    Appearance: Normal appearance. She is not ill-appearing, toxic-appearing or  diaphoretic.  HENT:     Head: Normocephalic and atraumatic.     Right Ear: External ear normal.     Left Ear: External ear normal.     Nose: Nose normal.     Mouth/Throat:     Mouth: Mucous membranes are moist.     Pharynx: Oropharynx is clear.  Eyes:     General: No scleral icterus.       Right eye: No discharge.        Left eye: No discharge.     Extraocular Movements: Extraocular movements intact.     Conjunctiva/sclera: Conjunctivae normal.     Pupils: Pupils are equal, round, and reactive to light.  Cardiovascular:     Rate and Rhythm: Normal rate and regular rhythm.     Pulses: Normal pulses.     Heart sounds: Normal heart sounds. No murmur. No friction rub. No gallop.   Pulmonary:     Effort: Pulmonary effort is normal. No respiratory distress.     Breath sounds: Normal breath sounds. No stridor. No wheezing, rhonchi or rales.  Chest:     Chest wall: No tenderness.  Musculoskeletal:        General: Normal range of motion.     Cervical back: Normal range of motion and neck supple.  Skin:    General: Skin is warm and dry.     Capillary Refill: Capillary refill takes less than 2 seconds.  Coloration: Skin is not jaundiced or pale.     Findings: No bruising, erythema, lesion or rash.  Neurological:     General: No focal deficit present.     Mental Status: She is alert and oriented to person, place, and time. Mental status is at baseline.  Psychiatric:        Mood and Affect: Mood normal.        Behavior: Behavior normal.        Thought Content: Thought content normal.        Judgment: Judgment normal.     Results for orders placed or performed during the hospital encounter of 03/05/20  Urinalysis, Complete w Microscopic (For [facility] ONLY)  Result Value Ref Range   Color, Urine YELLOW YELLOW   APPearance CLEAR CLEAR   Specific Gravity, Urine 1.025 1.005 - 1.030   pH 5.5 5.0 - 8.0   Glucose, UA NEGATIVE NEGATIVE mg/dL   Hgb urine dipstick NEGATIVE NEGATIVE     Bilirubin Urine NEGATIVE NEGATIVE   Ketones, ur NEGATIVE NEGATIVE mg/dL   Protein, ur NEGATIVE NEGATIVE mg/dL   Nitrite NEGATIVE NEGATIVE   Leukocytes,Ua NEGATIVE NEGATIVE   Squamous Epithelial / LPF 6-10 0 - 5   WBC, UA 0-5 0 - 5 WBC/hpf   RBC / HPF NONE SEEN 0 - 5 RBC/hpf   Bacteria, UA FEW (A) NONE SEEN      Assessment & Plan:   Problem List Items Addressed This Visit      Endocrine   Controlled type 2 diabetes mellitus ([place]) - Primary    Stable at 7.5- will increase her metformin and recheck 1 month on her fatigue. Call with any concerns. Continue to monitor.       Relevant Medications   metFORMIN (GLUCOPHAGE-XR) 500 MG 24 hr tablet   Other Relevant Orders   Bayer DCA Hb A1c Waived       Follow up plan: Return in about 4 weeks (around 04/29/2020) for follow up fatigue and cholesterol.

## 2020-04-01 NOTE — Assessment & Plan Note (Signed)
Stable at 7.5- will increase her metformin and recheck 1 month on her fatigue. Call with any concerns. Continue to monitor.

## 2020-04-20 ENCOUNTER — Ambulatory Visit (INDEPENDENT_AMBULATORY_CARE_PROVIDER_SITE_OTHER): Payer: Medicare Other | Admitting: Pharmacist

## 2020-04-20 DIAGNOSIS — M791 Myalgia, unspecified site: Secondary | ICD-10-CM

## 2020-04-20 DIAGNOSIS — E119 Type 2 diabetes mellitus without complications: Secondary | ICD-10-CM | POA: Diagnosis not present

## 2020-04-20 DIAGNOSIS — I739 Peripheral vascular disease, unspecified: Secondary | ICD-10-CM

## 2020-04-20 DIAGNOSIS — E782 Mixed hyperlipidemia: Secondary | ICD-10-CM

## 2020-04-20 DIAGNOSIS — T466X5A Adverse effect of antihyperlipidemic and antiarteriosclerotic drugs, initial encounter: Secondary | ICD-10-CM

## 2020-04-20 NOTE — Patient Instructions (Signed)
Visit Information  Goals Addressed            This Visit's Progress     Patient Stated   . PharmD "My medication is expensive" (pt-stated)       CARE PLAN ENTRY (see longtitudinal plan of care for additional care plan information)  Current Barriers:  . Polypharmacy; complex patient with multiple comorbidities including HLD, PVD . Reports her biggest concern today is sinus congestion related to pollen allergies. Notes that "all antihistamines" including second generation antihistamines make her too tired. Just picked up OTC Flonase nasal spray to try . Self-manages medications  o HLD: Repatha 140 mg Q14 days. Continues to deny any issues with this medication. Lipid panel at next appointment o ASCVD risk reduction: ASA 81 mg daily  o T2DM: last A1c 7.5%, metformin XR 1000 mg BID; denies any GI upset since increasing this dose. Not checking BG right now. o HTN: losartan 50 mg daily, last BP at goal <130/80 o Overactive bladder/hx bladder cancer: oxybutynin XL 10 mg daily; f/u with Dr. Erlene Quan in July o Depression: escitalopram 10 mg daily o Sciatica: gabapentin 600 mg QPM, baclofen 10 mg QPM PRN, naproxen 500 mg BID  Pharmacist Clinical Goal(s):  Marland Kitchen Over the next 90 days, patient will work with PharmD and provider towards optimized medication management  Interventions: . Comprehensive medication review performed, medication list updated in electronic medical record . Inter-disciplinary care team collaboration (see longitudinal plan of care) . Encouraged continued adherence to increased dose of metformin. Likely appropriate to defer SMBG finger sticks at this time . Encouraged continued adherence to Repatha.  . Discussed current allergy symptoms. Suggested she take second generation antihistamine QPM, and see if that helps alleviate next-day allergy symptoms without causing daytime drowsiness. Also counseled that intranasal steroids will require ~week of daily dosing to see full benefit.  She verbalized understanding.   Patient Self Care Activities:  . Patient will take medications as prescribed  Please see past updates related to this goal by clicking on the "Past Updates" button in the selected goal         Patient verbalizes understanding of instructions provided today.   Plan:  - Scheduled f/u call in~ 12 weeks  [name], PharmD, [place] [phone]

## 2020-04-20 NOTE — Chronic Care Management (AMB) (Signed)
Chronic Care Management   Follow Up Note   04/20/2020 Name: Stacy Mercado MRN: [id number] DOB: 1946/06/03  Referred by: Valerie Roys, DO Reason for referral : Chronic Care Management (Medication Management)   Stacy Mercado is a 74 y.o. year old female who is a primary care patient of Valerie Roys, DO. The CCM team was consulted for assistance with chronic disease management and care coordination needs.    Contacted patient for medication management review.   Review of patient status, including review of consultants reports, relevant laboratory and other test results, and collaboration with appropriate care team members and the patient's provider was performed as part of comprehensive patient evaluation and provision of chronic care management services.    SDOH (Social Determinants of Health) assessments performed: Yes See Care Plan activities for detailed interventions related to [facility])     Outpatient Encounter Medications as of 04/20/2020  Medication Sig Note  . acetaminophen (TYLENOL) 500 MG tablet Take 1,000 mg every 6 (six) hours as needed by mouth for moderate pain or headache.    Marland Kitchen aspirin 81 MG chewable tablet Chew 81 mg by mouth daily.   . baclofen (LIORESAL) 10 MG tablet Take 1 tablet (10 mg total) by mouth at bedtime. 03/09/2020: PRN   . escitalopram (LEXAPRO) 10 MG tablet TAKE 1 TABLET(10 MG) BY MOUTH DAILY   . Evolocumab (REPATHA SURECLICK) [id number] MG/ML SOAJ Inject 140 mg into the skin every 14 (fourteen) days.   . fluticasone (FLONASE) 50 MCG/ACT nasal spray Place 1 spray into both nostrils daily.   Marland Kitchen gabapentin (NEURONTIN) 300 MG capsule Take 1 capsule (300 mg total) by mouth at bedtime.   Marland Kitchen levothyroxine (SYNTHROID, LEVOTHROID) 50 MCG tablet Take 1 tablet (50 mcg total) by mouth daily before breakfast.   . Lidocaine-Menthol (ICY HOT LIDOCAINE PLUS MENTHOL EX) Apply 1 application topically 2 (two) times daily as needed (muscle pain).   Marland Kitchen losartan (COZAAR) 50 MG  tablet TAKE 1 TABLET(50 MG) BY MOUTH DAILY   . metFORMIN (GLUCOPHAGE-XR) 500 MG 24 hr tablet Take 2 tablets (1,000 mg total) by mouth in the morning and at bedtime.   . Multiple Vitamin (MULTIVITAMIN WITH MINERALS) TABS tablet Take 1 tablet by mouth daily.   . naproxen (NAPROSYN) 500 MG tablet TAKE 1 TABLET(500 MG) BY MOUTH TWICE DAILY WITH A MEAL   . oxybutynin (DITROPAN-XL) 10 MG 24 hr tablet TAKE 1 TABLET(10 MG) BY MOUTH AT BEDTIME    No facility-administered encounter medications on file as of 04/20/2020.     Objective:   Goals Addressed            This Visit's Progress     Patient Stated   . PharmD "My medication is expensive" (pt-stated)       CARE PLAN ENTRY (see longtitudinal plan of care for additional care plan information)  Current Barriers:  . Polypharmacy; complex patient with multiple comorbidities including HLD, PVD . Reports her biggest concern today is sinus congestion related to pollen allergies. Notes that "all antihistamines" including second generation antihistamines make her too tired. Just picked up OTC Flonase nasal spray to try . Self-manages medications  o HLD: Repatha 140 mg Q14 days. Continues to deny any issues with this medication. Lipid panel at next appointment o ASCVD risk reduction: ASA 81 mg daily  o T2DM: last A1c 7.5%, metformin XR 1000 mg BID; denies any GI upset since increasing this dose. Not checking BG right now. o HTN: losartan 50 mg daily,  last BP at goal <130/80 o Overactive bladder/hx bladder cancer: oxybutynin XL 10 mg daily; f/u with Dr. Erlene Quan in July o Depression: escitalopram 10 mg daily o Sciatica: gabapentin 600 mg QPM, baclofen 10 mg QPM PRN, naproxen 500 mg BID  Pharmacist Clinical Goal(s):  Marland Kitchen Over the next 90 days, patient will work with PharmD and provider towards optimized medication management  Interventions: . Comprehensive medication review performed, medication list updated in electronic medical  record . Inter-disciplinary care team collaboration (see longitudinal plan of care) . Encouraged continued adherence to increased dose of metformin. Likely appropriate to defer SMBG finger sticks at this time . Encouraged continued adherence to Repatha.  . Discussed current allergy symptoms.   Patient Self Care Activities:  . Patient will take medications as prescribed  Please see past updates related to this goal by clicking on the "Past Updates" button in the selected goal          Plan:  - Scheduled f/u call in~ 12 weeks  [name], PharmD, [place]  [phone]

## 2020-05-01 ENCOUNTER — Other Ambulatory Visit: Payer: Self-pay | Admitting: Urology

## 2020-05-01 DIAGNOSIS — N3281 Overactive bladder: Secondary | ICD-10-CM

## 2020-05-03 ENCOUNTER — Ambulatory Visit (INDEPENDENT_AMBULATORY_CARE_PROVIDER_SITE_OTHER): Payer: Medicare Other | Admitting: Family Medicine

## 2020-05-03 ENCOUNTER — Encounter: Payer: Self-pay | Admitting: Family Medicine

## 2020-05-03 ENCOUNTER — Other Ambulatory Visit: Payer: Self-pay

## 2020-05-03 VITALS — BP 161/69 | HR 56 | Temp 98.5°F | Ht 64.96 in | Wt 197.4 lb

## 2020-05-03 DIAGNOSIS — J301 Allergic rhinitis due to pollen: Secondary | ICD-10-CM

## 2020-05-03 DIAGNOSIS — E119 Type 2 diabetes mellitus without complications: Secondary | ICD-10-CM | POA: Diagnosis not present

## 2020-05-03 DIAGNOSIS — I129 Hypertensive chronic kidney disease with stage 1 through stage 4 chronic kidney disease, or unspecified chronic kidney disease: Secondary | ICD-10-CM | POA: Diagnosis not present

## 2020-05-03 DIAGNOSIS — E782 Mixed hyperlipidemia: Secondary | ICD-10-CM

## 2020-05-03 MED ORDER — NAPROXEN 500 MG PO TABS: 500.0000 mg | ORAL_TABLET | Freq: Two times a day (BID) | ORAL | 1 refills | Status: DC

## 2020-05-03 MED ORDER — LOSARTAN POTASSIUM 50 MG PO TABS: 75.0000 mg | ORAL_TABLET | Freq: Every day | ORAL | 1 refills | Status: DC

## 2020-05-03 MED ORDER — MONTELUKAST SODIUM 10 MG PO TABS: 10.0000 mg | ORAL_TABLET | Freq: Every day | ORAL | 3 refills | Status: DC

## 2020-05-03 NOTE — Assessment & Plan Note (Addendum)
Tolerating her metformin well. Continue to monitor. Call with any concerns. Recheck A1c in 2 months.

## 2020-05-03 NOTE — Assessment & Plan Note (Signed)
Not under good control. Will increase her losartan to 75mg  and recheck 2 months. Call with any concerns.

## 2020-05-03 NOTE — Assessment & Plan Note (Signed)
Tolerating her repatha well. Rechecking labs today. Await results. Call with any concerns.

## 2020-05-03 NOTE — Progress Notes (Signed)
BP (!) 161/69 (BP Location: Left Arm, Patient Position: Sitting, Cuff Size: Normal)   Pulse (!) 56   Temp 98.5 F (36.9 C) (Oral)   Ht 5' 4.96" (1.65 m)   Wt 197 lb 6.4 oz (89.5 kg)   SpO2 96%   BMI 32.89 kg/m    Subjective:    Patient ID: Stacy Mercado, female    DOB: 1946/09/07, 74 y.o.   MRN: [id number]  HPI: Stacy Mercado is a 74 y.o. female  Chief Complaint  Patient presents with  . Hyperlipidemia  . Fatigue  . Medication Refill    naproxen   HYPERTENSION / HYPERLIPIDEMIA Satisfied with current treatment? yes Duration of hypertension: chronic BP monitoring frequency: not checking BP medication side effects: no Past BP meds: losartan Duration of hyperlipidemia: chronic Cholesterol medication side effects: no Cholesterol supplements: none Past cholesterol medications: repatha Medication compliance: excellent compliance Aspirin: no Recent stressors: no Recurrent headaches: no Visual changes: no Palpitations: no Dyspnea: no Chest pain: no Lower extremity edema: no Dizzy/lightheaded: no   Relevant past medical, surgical, family and social history reviewed and updated as indicated. Interim medical history since our last visit reviewed. Allergies and medications reviewed and updated.  Review of Systems  Constitutional: Negative.   HENT: Negative.   Respiratory: Positive for cough. Negative for apnea, choking, chest tightness, shortness of breath, wheezing and stridor.   Cardiovascular: Negative.   Gastrointestinal: Negative.   Psychiatric/Behavioral: Negative.     Per HPI unless specifically indicated above     Objective:    BP (!) 161/69 (BP Location: Left Arm, Patient Position: Sitting, Cuff Size: Normal)   Pulse (!) 56   Temp 98.5 F (36.9 C) (Oral)   Ht 5' 4.96" (1.65 m)   Wt 197 lb 6.4 oz (89.5 kg)   SpO2 96%   BMI 32.89 kg/m   Wt Readings from Last 3 Encounters:  05/03/20 197 lb 6.4 oz (89.5 kg)  04/01/20 197 lb (89.4 kg)    03/05/20 200 lb (90.7 kg)    Physical Exam Vitals and nursing note reviewed.  Constitutional:      General: She is not in acute distress.    Appearance: Normal appearance. She is not ill-appearing, toxic-appearing or diaphoretic.  HENT:     Head: Normocephalic and atraumatic.     Right Ear: External ear normal.     Left Ear: External ear normal.     Nose: Nose normal.     Mouth/Throat:     Mouth: Mucous membranes are moist.     Pharynx: Oropharynx is clear.  Eyes:     General: No scleral icterus.       Right eye: No discharge.        Left eye: No discharge.     Extraocular Movements: Extraocular movements intact.     Conjunctiva/sclera: Conjunctivae normal.     Pupils: Pupils are equal, round, and reactive to light.  Cardiovascular:     Rate and Rhythm: Normal rate and regular rhythm.     Pulses: Normal pulses.     Heart sounds: Normal heart sounds. No murmur. No friction rub. No gallop.   Pulmonary:     Effort: Pulmonary effort is normal. No respiratory distress.     Breath sounds: Normal breath sounds. No stridor. No wheezing, rhonchi or rales.  Chest:     Chest wall: No tenderness.  Musculoskeletal:        General: Normal range of motion.     Cervical back:  Normal range of motion and neck supple.  Skin:    General: Skin is warm and dry.     Capillary Refill: Capillary refill takes less than 2 seconds.     Coloration: Skin is not jaundiced or pale.     Findings: No bruising, erythema, lesion or rash.  Neurological:     General: No focal deficit present.     Mental Status: She is alert and oriented to person, place, and time. Mental status is at baseline.  Psychiatric:        Mood and Affect: Mood normal.        Behavior: Behavior normal.        Thought Content: Thought content normal.        Judgment: Judgment normal.     Results for orders placed or performed in visit on 04/01/20  Bayer DCA Hb A1c Waived  Result Value Ref Range   HB A1C (BAYER DCA - WAIVED)  7.5 (H) <7.0 %      Assessment & Plan:   Problem List Items Addressed This Visit      Endocrine   Controlled type 2 diabetes mellitus ([place])    Tolerating her metformin well. Continue to monitor. Call with any concerns. Recheck A1c in 2 months.       Relevant Medications   losartan (COZAAR) 50 MG tablet     Genitourinary   Benign hypertensive renal disease - Primary    Not under good control. Will increase her losartan to 75mg  and recheck 2 months. Call with any concerns.         Other   Hyperlipidemia    Tolerating her repatha well. Rechecking labs today. Await results. Call with any concerns.       Relevant Medications   losartan (COZAAR) 50 MG tablet   Other Relevant Orders   Comprehensive metabolic panel   Lipid Panel w/o Chol/HDL Ratio    Other Visit Diagnoses    Seasonal allergic rhinitis due to pollen       Will start singulair to help with allergic cough. Call if not getting better or getting worse.        Follow up plan: Return in about 2 months (around 07/03/2020) for DM, BP, cholesterol follow up.

## 2020-05-04 LAB — COMPREHENSIVE METABOLIC PANEL
ALT: 28 IU/L (ref 0–32)
AST: 19 IU/L (ref 0–40)
Albumin/Globulin Ratio: 1.5 (ref 1.2–2.2)
Albumin: 4 g/dL (ref 3.7–4.7)
Alkaline Phosphatase: 86 IU/L (ref 48–121)
BUN/Creatinine Ratio: 27 (ref 12–28)
BUN: 20 mg/dL (ref 8–27)
Bilirubin Total: 0.6 mg/dL (ref 0.0–1.2)
CO2: 26 mmol/L (ref 20–29)
Calcium: 10.4 mg/dL — ABNORMAL HIGH (ref 8.7–10.3)
Chloride: 101 mmol/L (ref 96–106)
Creatinine, Ser: 0.73 mg/dL (ref 0.57–1.00)
GFR calc Af Amer: 93 mL/min/{1.73_m2} (ref >59)
GFR calc non Af Amer: 81 mL/min/{1.73_m2} (ref >59)
Globulin, Total: 2.7 g/dL (ref 1.5–4.5)
Glucose: 144 mg/dL — ABNORMAL HIGH (ref 65–99)
Potassium: 4.7 mmol/L (ref 3.5–5.2)
Sodium: 141 mmol/L (ref 134–144)
Total Protein: 6.7 g/dL (ref 6.0–8.5)

## 2020-05-04 LAB — LIPID PANEL W/O CHOL/HDL RATIO
Cholesterol, Total: 112 mg/dL (ref 100–199)
HDL: 46 mg/dL (ref >39)
LDL Chol Calc (NIH): 45 mg/dL (ref 0–99)
Triglycerides: 117 mg/dL (ref 0–149)
VLDL Cholesterol Cal: 21 mg/dL (ref 5–40)

## 2020-05-24 ENCOUNTER — Other Ambulatory Visit: Payer: Self-pay | Admitting: Family Medicine

## 2020-05-24 NOTE — Telephone Encounter (Signed)
Requested Prescriptions  Pending Prescriptions Disp Refills   levothyroxine (SYNTHROID) 50 MCG tablet [Pharmacy Med Name: LEVOTHYROXINE 0.05MG  (50MCG) TAB] 90 tablet 3    Sig: TAKE 1 TABLET BY MOUTH DAILY BEFORE BREAKFAST     Endocrinology:  Hypothyroid Agents Failed - 05/24/2020 11:37 PM      Failed - TSH needs to be rechecked within 3 months after an abnormal result. Refill until TSH is due.      Passed - TSH in normal range and within 360 days    TSH  Date Value Ref Range Status  01/02/2020 3.900 0.450 - 4.500 uIU/mL Final         Passed - Valid encounter within last 12 months    Recent Outpatient Visits          3 weeks ago Benign hypertensive renal disease   [facility] [place], Megan P, DO   1 month ago Controlled type 2 diabetes mellitus without complication, without long-term current use of insulin ([place])   [place], Megan P, DO   4 months ago Controlled type 2 diabetes mellitus without complication, without long-term current use of insulin ([place])   [place], Megan P, DO   10 months ago Patient left without being seen   [place], Megan P, DO   1 year ago PVD (peripheral vascular disease) [facility])   [place], Megan P, DO      Future Appointments            In 1 month Johnson, Barb Merino, DO [employer], [place]   In 1 month Hollice Espy, MD [place]   In 7 months  [employer], [place]

## 2020-06-14 ENCOUNTER — Telehealth: Payer: Self-pay

## 2020-06-14 NOTE — Telephone Encounter (Signed)
PA for Repatha initiated and submitted via Cover My Meds. Key:  [id number]

## 2020-06-14 NOTE — Telephone Encounter (Signed)
PA approved.

## 2020-07-05 ENCOUNTER — Ambulatory Visit
Admission: RE | Admit: 2020-07-05 | Discharge: 2020-07-05 | Disposition: A | Discharge status: Home / Self Care | Payer: Medicare Other | Source: Ambulatory Visit | Attending: Urology | Admitting: Urology

## 2020-07-05 ENCOUNTER — Other Ambulatory Visit: Payer: Self-pay

## 2020-07-05 DIAGNOSIS — N2889 Other specified disorders of kidney and ureter: Secondary | ICD-10-CM | POA: Diagnosis not present

## 2020-07-05 DIAGNOSIS — N281 Cyst of kidney, acquired: Secondary | ICD-10-CM | POA: Diagnosis not present

## 2020-07-05 DIAGNOSIS — C649 Malignant neoplasm of unspecified kidney, except renal pelvis: Secondary | ICD-10-CM | POA: Diagnosis not present

## 2020-07-06 ENCOUNTER — Encounter: Payer: Self-pay | Admitting: Family Medicine

## 2020-07-06 ENCOUNTER — Ambulatory Visit (INDEPENDENT_AMBULATORY_CARE_PROVIDER_SITE_OTHER): Payer: Medicare Other | Admitting: Family Medicine

## 2020-07-06 VITALS — BP 134/75 | HR 67 | Temp 98.3°F | Wt 194.8 lb

## 2020-07-06 DIAGNOSIS — I129 Hypertensive chronic kidney disease with stage 1 through stage 4 chronic kidney disease, or unspecified chronic kidney disease: Secondary | ICD-10-CM | POA: Diagnosis not present

## 2020-07-06 DIAGNOSIS — E119 Type 2 diabetes mellitus without complications: Secondary | ICD-10-CM | POA: Diagnosis not present

## 2020-07-06 DIAGNOSIS — M5432 Sciatica, left side: Secondary | ICD-10-CM | POA: Diagnosis not present

## 2020-07-06 LAB — BAYER DCA HB A1C WAIVED: HB A1C (BAYER DCA - WAIVED): 6.4 % (ref <7.0)

## 2020-07-06 MED ORDER — ESCITALOPRAM OXALATE 10 MG PO TABS: ORAL_TABLET | ORAL | 1 refills | Status: DC

## 2020-07-06 MED ORDER — REPATHA SURECLICK 140 MG/ML ~~LOC~~ SOAJ: 140.0000 mg | SUBCUTANEOUS | 6 refills | Status: DC

## 2020-07-06 MED ORDER — GABAPENTIN 300 MG PO CAPS: 300.0000 mg | ORAL_CAPSULE | Freq: Every day | ORAL | 1 refills | Status: DC

## 2020-07-06 MED ORDER — LOSARTAN POTASSIUM 50 MG PO TABS: 75.0000 mg | ORAL_TABLET | Freq: Every day | ORAL | 1 refills | Status: DC

## 2020-07-06 MED ORDER — METFORMIN HCL ER 500 MG PO TB24: 1000.0000 mg | ORAL_TABLET | Freq: Two times a day (BID) | ORAL | 1 refills | Status: DC

## 2020-07-06 MED ORDER — NAPROXEN 500 MG PO TABS: 500.0000 mg | ORAL_TABLET | Freq: Two times a day (BID) | ORAL | 1 refills | Status: DC
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
## 2020-07-06 NOTE — Progress Notes (Signed)
BP 134/75 (BP Location: Left Arm, Patient Position: Sitting, Cuff Size: Normal)   Pulse 67   Temp 98.3 F (36.8 C) (Oral)   Wt 194 lb 12.8 oz (88.4 kg)   SpO2 93%   BMI 32.46 kg/m    Subjective:    Patient ID: Stacy Mercado, female    DOB: [birth year], 74 y.o.   MRN: [id number]  HPI: Stacy Mercado is a 74 y.o. female  Chief Complaint  Patient presents with  . Diabetes  . Hyperlipidemia  . Hypertension   HYPERTENSION Hypertension status: controlled  Satisfied with current treatment? yes Duration of hypertension: chronic BP monitoring frequency:  not checking BP medication side effects:  no Medication compliance: excellent compliance Previous BP meds: losartan Aspirin: no Recurrent headaches: no Visual changes: no Palpitations: no Dyspnea: no Chest pain: no Lower extremity edema: no Dizzy/lightheaded: no  DIABETES Hypoglycemic episodes:no Polydipsia/polyuria: no Visual disturbance: no Chest pain: no Paresthesias: no Glucose Monitoring: yes  Accucheck frequency: occasionally Taking Insulin?: no Blood Pressure Monitoring: not checking Retinal Examination: Up to Date Foot Exam: Up to Date Diabetic Education: Completed Pneumovax: Up to Date Influenza: Up to Date Aspirin: yes   Back has really been bothering her. She has been having pain in her L low back pain pain radiating down her L leg. Worse with movement and better with rest sometimes, but sometimes nothing helps. Pain is aching and sore. It's been going on for weeks. She doesn't want to do PT as she doesn't feel like it's helpful. She is otherwise feeling well with no other concerns or complaints at this time.    Relevant past medical, surgical, family and social history reviewed and updated as indicated. Interim medical history since our last visit reviewed. Allergies and medications reviewed and updated.  Review of Systems  Constitutional: Negative.   Respiratory: Negative.   Cardiovascular:  Negative.   Gastrointestinal: Negative.   Neurological: Negative.   Psychiatric/Behavioral: Negative.     Per HPI unless specifically indicated above     Objective:    BP 134/75 (BP Location: Left Arm, Patient Position: Sitting, Cuff Size: Normal)   Pulse 67   Temp 98.3 F (36.8 C) (Oral)   Wt 194 lb 12.8 oz (88.4 kg)   SpO2 93%   BMI 32.46 kg/m   Wt Readings from Last 3 Encounters:  07/07/20 194 lb (88 kg)  07/06/20 194 lb 12.8 oz (88.4 kg)  05/03/20 197 lb 6.4 oz (89.5 kg)    Physical Exam Vitals and nursing note reviewed.  Constitutional:      General: She is not in acute distress.    Appearance: Normal appearance. She is not ill-appearing, toxic-appearing or diaphoretic.  HENT:     Head: Normocephalic and atraumatic.     Right Ear: External ear normal.     Left Ear: External ear normal.     Nose: Nose normal.     Mouth/Throat:     Mouth: Mucous membranes are moist.     Pharynx: Oropharynx is clear.  Eyes:     General: No scleral icterus.       Right eye: No discharge.        Left eye: No discharge.     Extraocular Movements: Extraocular movements intact.     Conjunctiva/sclera: Conjunctivae normal.     Pupils: Pupils are equal, round, and reactive to light.  Cardiovascular:     Rate and Rhythm: Normal rate and regular rhythm.     Pulses: Normal pulses.  Heart sounds: Normal heart sounds. No murmur heard.  No friction rub. No gallop.   Pulmonary:     Effort: Pulmonary effort is normal. No respiratory distress.     Breath sounds: Normal breath sounds. No stridor. No wheezing, rhonchi or rales.  Chest:     Chest wall: No tenderness.  Musculoskeletal:        General: Normal range of motion.     Cervical back: Normal range of motion and neck supple.  Skin:    General: Skin is warm and dry.     Capillary Refill: Capillary refill takes less than 2 seconds.     Coloration: Skin is not jaundiced or pale.     Findings: No bruising, erythema, lesion or rash.    Neurological:     General: No focal deficit present.     Mental Status: She is alert and oriented to person, place, and time. Mental status is at baseline.  Psychiatric:        Mood and Affect: Mood normal.        Behavior: Behavior normal.        Thought Content: Thought content normal.        Judgment: Judgment normal.     Results for orders placed or performed in visit on 07/06/20  Bayer DCA Hb A1c Waived  Result Value Ref Range   HB A1C (BAYER DCA - WAIVED) 6.4 <8.0 %  Basic metabolic panel  Result Value Ref Range   Glucose 222 (H) 65 - 99 mg/dL   BUN 16 8 - 27 mg/dL   Creatinine, Ser 0.75 0.57 - 1.00 mg/dL   GFR calc non Af Amer 78 >59 mL/min/1.73   GFR calc Af Amer 90 >59 mL/min/1.73   BUN/Creatinine Ratio 21 12 - 28   Sodium 142 134 - 144 mmol/L   Potassium 4.4 3.5 - 5.2 mmol/L   Chloride 103 96 - 106 mmol/L   CO2 26 20 - 29 mmol/L   Calcium 10.3 8.7 - 10.3 mg/dL      Assessment & Plan:   Problem List Items Addressed This Visit      Endocrine   Controlled type 2 diabetes mellitus ([place]) - Primary    Doing well with A1c of 6.4. Continue current regimen. Continue to monitor. Call with any concerns. Continue to monitor.       Relevant Medications   losartan (COZAAR) 50 MG tablet   metFORMIN (GLUCOPHAGE-XR) 500 MG 24 hr tablet   Other Relevant Orders   Bayer DCA Hb A1c Waived (Completed)   Basic metabolic panel (Completed)   Ambulatory referral to Ophthalmology     Nervous and Auditory   Sciatica of left side    Not doing well. Frustrated. Refuses PT. Will get her into physiatry for evaluation. Call with any concerns. Continue to monitor.       Relevant Medications   gabapentin (NEURONTIN) 300 MG capsule   escitalopram (LEXAPRO) 10 MG tablet   Other Relevant Orders   Ambulatory referral to Physical Medicine Rehab     Genitourinary   Benign hypertensive renal disease    Under good control on current regimen. Continue current regimen. Continue to monitor.  Call with any concerns. Refills given. Labs drawn today.       Relevant Orders   Basic metabolic panel (Completed)       Follow up plan: Return in about 6 months (around 01/06/2021), or wellness/physical.

## 2020-07-07 ENCOUNTER — Other Ambulatory Visit: Payer: Self-pay

## 2020-07-07 ENCOUNTER — Ambulatory Visit (INDEPENDENT_AMBULATORY_CARE_PROVIDER_SITE_OTHER): Payer: Medicare Other | Admitting: Urology

## 2020-07-07 VITALS — BP 154/79 | HR 71 | Ht 65.5 in | Wt 194.0 lb

## 2020-07-07 DIAGNOSIS — N3281 Overactive bladder: Secondary | ICD-10-CM

## 2020-07-07 DIAGNOSIS — N2889 Other specified disorders of kidney and ureter: Secondary | ICD-10-CM

## 2020-07-07 DIAGNOSIS — Z8551 Personal history of malignant neoplasm of bladder: Secondary | ICD-10-CM | POA: Diagnosis not present

## 2020-07-07 LAB — BASIC METABOLIC PANEL
BUN/Creatinine Ratio: 21 (ref 12–28)
BUN: 16 mg/dL (ref 8–27)
CO2: 26 mmol/L (ref 20–29)
Calcium: 10.3 mg/dL (ref 8.7–10.3)
Chloride: 103 mmol/L (ref 96–106)
Creatinine, Ser: 0.75 mg/dL (ref 0.57–1.00)
GFR calc Af Amer: 90 mL/min/{1.73_m2} (ref >59)
GFR calc non Af Amer: 78 mL/min/{1.73_m2} (ref >59)
Glucose: 222 mg/dL — ABNORMAL HIGH (ref 65–99)
Potassium: 4.4 mmol/L (ref 3.5–5.2)
Sodium: 142 mmol/L (ref 134–144)

## 2020-07-07 NOTE — Patient Instructions (Signed)
Cystoscopy Cystoscopy is a procedure that is used to help diagnose and sometimes treat conditions that affect the lower urinary tract. The lower urinary tract includes the bladder and the urethra. The urethra is the tube that drains urine from the bladder. Cystoscopy is done using a thin, tube-shaped instrument with a light and camera at the end (cystoscope). The cystoscope may be hard or flexible, depending on the goal of the procedure. The cystoscope is inserted through the urethra, into the bladder. Cystoscopy may be recommended if you have:  Urinary tract infections that keep coming back.  Blood in the urine (hematuria).  An inability to control when you urinate (urinary incontinence) or an overactive bladder.  Unusual cells found in a urine sample.  A blockage in the urethra, such as a urinary stone.  Painful urination.  An abnormality in the bladder found during an intravenous pyelogram (IVP) or CT scan. Cystoscopy may also be done to remove a sample of tissue to be examined under a microscope (biopsy). What are the risks? Generally, this is a safe procedure. However, problems may occur, including:  Infection.  Bleeding.  What happens during the procedure?  1. You will be given one or more of the following: ? A medicine to numb the area (local anesthetic). 2. The area around the opening of your urethra will be cleaned. 3. The cystoscope will be passed through your urethra into your bladder. 4. Germ-free (sterile) fluid will flow through the cystoscope to fill your bladder. The fluid will stretch your bladder so that your health care provider can clearly examine your bladder walls. 5. Your doctor will look at the urethra and bladder. 6. The cystoscope will be removed The procedure may vary among health care providers  What can I expect after the procedure? After the procedure, it is common to have: 1. Some soreness or pain in your abdomen and urethra. 2. Urinary symptoms.  These include: ? Mild pain or burning when you urinate. Pain should stop within a few minutes after you urinate. This may last for up to 1 week. ? A small amount of blood in your urine for several days. ? Feeling like you need to urinate but producing only a small amount of urine. Follow these instructions at home: General instructions  Return to your normal activities as told by your health care provider.   Do not drive for 24 hours if you were given a sedative during your procedure.  Watch for any blood in your urine. If the amount of blood in your urine increases, call your health care provider.  If a tissue sample was removed for testing (biopsy) during your procedure, it is up to you to get your test results. Ask your health care provider, or the department that is doing the test, when your results will be ready.  Drink enough fluid to keep your urine pale yellow.  Keep all follow-up visits as told by your health care provider. This is important. Contact a health care provider if you:  Have pain that gets worse or does not get better with medicine, especially pain when you urinate.  Have trouble urinating.  Have more blood in your urine. Get help right away if you:  Have blood clots in your urine.  Have abdominal pain.  Have a fever or chills.  Are unable to urinate. Summary  Cystoscopy is a procedure that is used to help diagnose and sometimes treat conditions that affect the lower urinary tract.  Cystoscopy is done using   a thin, tube-shaped instrument with a light and camera at the end.  After the procedure, it is common to have some soreness or pain in your abdomen and urethra.  Watch for any blood in your urine. If the amount of blood in your urine increases, call your health care provider.  If you were prescribed an antibiotic medicine, take it as told by your health care provider. Do not stop taking the antibiotic even if you start to feel better. This  information is not intended to replace advice given to you by your health care provider. Make sure you discuss any questions you have with your health care provider. Document Revised: 11/26/2018 Document Reviewed: 11/26/2018 Elsevier Patient Education  2020 [employer]vier Inc[employer]   

## 2020-07-08 ENCOUNTER — Encounter: Payer: Self-pay | Admitting: Family Medicine

## 2020-07-08 NOTE — Assessment & Plan Note (Signed)
Not doing well. Frustrated. Refuses PT. Will get her into physiatry for evaluation. Call with any concerns. Continue to monitor.

## 2020-07-08 NOTE — Assessment & Plan Note (Signed)
Doing well with A1c of 6.4. Continue current regimen. Continue to monitor. Call with any concerns. Continue to monitor.

## 2020-07-08 NOTE — Assessment & Plan Note (Signed)
Under good control on current regimen. Continue current regimen. Continue to monitor. Call with any concerns. Refills given. Labs drawn today.   

## 2020-07-09 ENCOUNTER — Other Ambulatory Visit: Payer: Self-pay | Admitting: Radiology

## 2020-07-09 DIAGNOSIS — N2889 Other specified disorders of kidney and ureter: Secondary | ICD-10-CM

## 2020-07-13 ENCOUNTER — Ambulatory Visit (INDEPENDENT_AMBULATORY_CARE_PROVIDER_SITE_OTHER): Payer: Medicare Other | Admitting: Pharmacist

## 2020-07-13 ENCOUNTER — Other Ambulatory Visit: Payer: Self-pay | Admitting: Physical Medicine & Rehabilitation

## 2020-07-13 DIAGNOSIS — G8929 Other chronic pain: Secondary | ICD-10-CM | POA: Insufficient documentation

## 2020-07-13 DIAGNOSIS — E119 Type 2 diabetes mellitus without complications: Secondary | ICD-10-CM

## 2020-07-13 DIAGNOSIS — I129 Hypertensive chronic kidney disease with stage 1 through stage 4 chronic kidney disease, or unspecified chronic kidney disease: Secondary | ICD-10-CM | POA: Diagnosis not present

## 2020-07-13 DIAGNOSIS — E782 Mixed hyperlipidemia: Secondary | ICD-10-CM | POA: Diagnosis not present

## 2020-07-13 DIAGNOSIS — M5442 Lumbago with sciatica, left side: Secondary | ICD-10-CM | POA: Insufficient documentation

## 2020-07-13 DIAGNOSIS — M5416 Radiculopathy, lumbar region: Secondary | ICD-10-CM

## 2020-07-13 NOTE — Patient Instructions (Signed)
Visit Information  Goals Addressed              This Visit's Progress     Patient Stated   .  PharmD "My medication is expensive" (pt-stated)        CARE PLAN ENTRY (see longtitudinal plan of care for additional care plan information)  Current Barriers:  . Social, financial, or community barriers: o Patient a bit stressed today with multiple upcoming appointments, including a CT for renal evaluation, MRI for leg/back, multiple appointments, and jury duty. Is looking forward to having her "calendar clear" . Polypharmacy; complex patient with multiple comorbidities including HLD, PVD o HLD: Repatha 140 mg Q14 days. Last LDL well at goal <70. Continues to deny issues w/ Repatha injections.  - Received approval for [employer] o ASCVD risk reduction: ASA 81 mg daily  o T2DM: last A1c 6.4%, metformin XR 1000 mg BID o HTN: losartan 50 mg daily, last BP at goal <130/80 - Had been increased to 75 mg daily by PCP, but patient reports it was making her dizzy, so she cut back to 1 tablet. Reports that she is not checking BP at home, but BP at ortho appt today was 120/56 o Overactive bladder/hx bladder cancer: oxybutynin XL 10 mg daily; renal CT tomorrow o Depression: escitalopram 10 mg daily o Sciatica: gabapentin 600 mg QPM, baclofen 10 mg QPM PRN, naproxen 500 mg BID; orthopedic appt today, MRI for evaluation next week  Pharmacist Clinical Goal(s):  Marland Kitchen Over the next 90 days, patient will work with PharmD and provider towards optimized medication management  Interventions: . Comprehensive medication review performed, medication list updated in electronic medical record . Inter-disciplinary care team collaboration (see longitudinal plan of care) . Praised for attainment of goal LDL for primary prevention. Encouraged continued adherence . Praised for attainment of goal A1c. Encouraged continued focus on adherence to medication and low carbohydrate diet.  . Discussed BP. Will  collaborate w/ PCP to update and re-send losartan script for 50 mg daily instead of 75 mg to prevent the patient from appearing "nonadherent" per fill hx. Discussed benefits of home BP monitoring with patient, if she feels she is sometimes anxious at office appointments, resulting in higher-than-normal BP readings. She verbalized understanding  Patient Self Care Activities:  . Patient will take medications as prescribed  Please see past updates related to this goal by clicking on the "Past Updates" button in the selected goal         The patient verbalized understanding of instructions provided today and declined a print copy of patient instruction materials.    Plan:  - Scheduled f/u call w/ PharmD in ~ 12 weeks  [name], PharmD, [place] [phone]

## 2020-07-13 NOTE — Chronic Care Management (AMB) (Signed)
Chronic Care Management   Follow Up Note   07/13/2020 Name: Stacy Mercado MRN: [id number] DOB: [birth year]  Referred by: Valerie Roys, DO Reason for referral : Chronic Care Management (Medication Management)   Stacy Mercado is a 74 y.o. year old female who is a primary care patient of Valerie Roys, DO. The CCM team was consulted for assistance with chronic disease management and care coordination needs.    Contacted patient for medication management review.   Review of patient status, including review of consultants reports, relevant laboratory and other test results, and collaboration with appropriate care team members and the patient's provider was performed as part of comprehensive patient evaluation and provision of chronic care management services.    SDOH (Social Determinants of Health) assessments performed: Yes See Care Plan activities for detailed interventions related to SDOH)  SDOH Interventions     Most Recent Value  SDOH Interventions  Financial Strain Interventions Other (Comment)  [medication access assistance]       Outpatient Encounter Medications as of 07/13/2020  Medication Sig Note  . acetaminophen (TYLENOL) 500 MG tablet Take 1,000 mg every 6 (six) hours as needed by mouth for moderate pain or headache.    Marland Kitchen aspirin 81 MG chewable tablet Chew 81 mg by mouth daily.   Marland Kitchen escitalopram (LEXAPRO) 10 MG tablet TAKE 1 TABLET(10 MG) BY MOUTH DAILY   . Evolocumab (REPATHA SURECLICK) 867 MG/ML SOAJ Inject 140 mg into the skin every 14 (fourteen) days.   Marland Kitchen gabapentin (NEURONTIN) 300 MG capsule Take 1 capsule (300 mg total) by mouth at bedtime.   Marland Kitchen levothyroxine (SYNTHROID) 50 MCG tablet TAKE 1 TABLET BY MOUTH DAILY BEFORE BREAKFAST   . Lidocaine-Menthol (ICY HOT LIDOCAINE PLUS MENTHOL EX) Apply 1 application topically 2 (two) times daily as needed (muscle pain).    Marland Kitchen losartan (COZAAR) 50 MG tablet Take 1.5 tablets (75 mg total) by mouth daily. 07/13/2020:  Taking losartan 50 mg daily   . metFORMIN (GLUCOPHAGE-XR) 500 MG 24 hr tablet Take 2 tablets (1,000 mg total) by mouth in the morning and at bedtime.   . montelukast (SINGULAIR) 10 MG tablet Take 1 tablet (10 mg total) by mouth at bedtime.   . Multiple Vitamin (MULTIVITAMIN WITH MINERALS) TABS tablet Take 1 tablet by mouth daily.   Marland Kitchen oxybutynin (DITROPAN-XL) 10 MG 24 hr tablet TAKE 1 TABLET(10 MG) BY MOUTH AT BEDTIME   . baclofen (LIORESAL) 10 MG tablet Take 1 tablet (10 mg total) by mouth at bedtime. 03/09/2020: PRN   . naproxen (NAPROSYN) 500 MG tablet Take 1 tablet (500 mg total) by mouth 2 (two) times daily with a meal.   . [DISCONTINUED] Multiple Vitamin (MULTIVITAMIN) tablet Take 1 tablet by mouth daily.    No facility-administered encounter medications on file as of 07/13/2020.     Objective:   Goals Addressed              This Visit's Progress     Patient Stated   .  PharmD "My medication is expensive" (pt-stated)        CARE PLAN ENTRY (see longtitudinal plan of care for additional care plan information)  Current Barriers:  . Social, financial, or community barriers: o Patient a bit stressed today with multiple upcoming appointments, including a CT for renal evaluation, MRI for leg/back, multiple appointments, and jury duty. Is looking forward to having her "calendar clear" . Polypharmacy; complex patient with multiple comorbidities including HLD, PVD o HLD: Repatha 140  mg Q14 days. Last LDL well at goal <70. Continues to deny issues w/ Repatha injections.  - Received approval for [employer] o ASCVD risk reduction: ASA 81 mg daily  o T2DM: last A1c 6.4%, metformin XR 1000 mg BID o HTN: losartan 50 mg daily, last BP at goal <130/80 - Had been increased to 75 mg daily by PCP, but patient reports it was making her dizzy, so she cut back to 1 tablet. Reports that she is not checking BP at home, but BP at ortho appt today was 120/56 o Overactive bladder/hx  bladder cancer: oxybutynin XL 10 mg daily; renal CT tomorrow o Depression: escitalopram 10 mg daily o Sciatica: gabapentin 600 mg QPM, baclofen 10 mg QPM PRN, naproxen 500 mg BID; orthopedic appt today, MRI for evaluation next week  Pharmacist Clinical Goal(s):  Marland Kitchen Over the next 90 days, patient will work with PharmD and provider towards optimized medication management  Interventions: . Comprehensive medication review performed, medication list updated in electronic medical record . Inter-disciplinary care team collaboration (see longitudinal plan of care) . Praised for attainment of goal LDL for primary prevention. Encouraged continued adherence . Praised for attainment of goal A1c. Encouraged continued focus on adherence to medication and low carbohydrate diet.  . Discussed BP. Will collaborate w/ PCP to update and re-send losartan script for 50 mg daily instead of 75 mg to prevent the patient from appearing "nonadherent" per fill hx. Discussed benefits of home BP monitoring with patient, if she feels she is sometimes anxious at office appointments, resulting in higher-than-normal BP readings. She verbalized understanding  Patient Self Care Activities:  . Patient will take medications as prescribed  Please see past updates related to this goal by clicking on the "Past Updates" button in the selected goal          Plan:  - Scheduled f/u call w/ PharmD in ~ 12 weeks  [name], PharmD, [place] [phone]

## 2020-07-14 ENCOUNTER — Ambulatory Visit
Admission: RE | Admit: 2020-07-14 | Discharge: 2020-07-14 | Disposition: A | Discharge status: Home / Self Care | Payer: Medicare Other | Source: Ambulatory Visit | Attending: Urology | Admitting: Urology

## 2020-07-14 ENCOUNTER — Other Ambulatory Visit: Payer: Self-pay

## 2020-07-14 DIAGNOSIS — N281 Cyst of kidney, acquired: Secondary | ICD-10-CM | POA: Diagnosis not present

## 2020-07-14 DIAGNOSIS — K760 Fatty (change of) liver, not elsewhere classified: Secondary | ICD-10-CM | POA: Diagnosis not present

## 2020-07-14 DIAGNOSIS — N2889 Other specified disorders of kidney and ureter: Secondary | ICD-10-CM | POA: Insufficient documentation

## 2020-07-14 DIAGNOSIS — I700 Atherosclerosis of aorta: Secondary | ICD-10-CM | POA: Diagnosis not present

## 2020-07-14 MED ORDER — IOHEXOL 300 MG/ML  SOLN
100.0000 mL | Freq: Once | INTRAMUSCULAR | Status: AC | PRN
  Administered 2020-07-14: 100 mL via INTRAVENOUS

## 2020-07-15 ENCOUNTER — Ambulatory Visit: Payer: Self-pay | Admitting: Pharmacist

## 2020-07-15 DIAGNOSIS — E782 Mixed hyperlipidemia: Secondary | ICD-10-CM

## 2020-07-15 NOTE — Chronic Care Management (AMB) (Signed)
Chronic Care Management   Follow Up Note   07/15/2020 Name: WENDELIN READER MRN: [id number] DOB: [birth year]  Referred by: Valerie Roys, DO Reason for referral : Chronic Care Management (Medication Management)   JAIMEY FRANCHINI is a 74 y.o. year old female who is a primary care patient of Valerie Roys, DO. The CCM team was consulted for assistance with chronic disease management and care coordination needs.    Patient called today with medication access concerns.  Review of patient status, including review of consultants reports, relevant laboratory and other test results, and collaboration with appropriate care team members and the patient's provider was performed as part of comprehensive patient evaluation and provision of chronic care management services.    SDOH (Social Determinants of Health) assessments performed: Yes See Care Plan activities for detailed interventions related to [facility])     Outpatient Encounter Medications as of 07/15/2020  Medication Sig Note  . acetaminophen (TYLENOL) 500 MG tablet Take 1,000 mg every 6 (six) hours as needed by mouth for moderate pain or headache.    Marland Kitchen aspirin 81 MG chewable tablet Chew 81 mg by mouth daily.   . baclofen (LIORESAL) 10 MG tablet Take 1 tablet (10 mg total) by mouth at bedtime. 03/09/2020: PRN   . escitalopram (LEXAPRO) 10 MG tablet TAKE 1 TABLET(10 MG) BY MOUTH DAILY   . Evolocumab (REPATHA SURECLICK) 409 MG/ML SOAJ Inject 140 mg into the skin every 14 (fourteen) days.   Marland Kitchen gabapentin (NEURONTIN) 300 MG capsule Take 1 capsule (300 mg total) by mouth at bedtime.   Marland Kitchen levothyroxine (SYNTHROID) 50 MCG tablet TAKE 1 TABLET BY MOUTH DAILY BEFORE BREAKFAST   . Lidocaine-Menthol (ICY HOT LIDOCAINE PLUS MENTHOL EX) Apply 1 application topically 2 (two) times daily as needed (muscle pain).    Marland Kitchen losartan (COZAAR) 50 MG tablet Take 1.5 tablets (75 mg total) by mouth daily. 07/13/2020: Taking losartan 50 mg daily   . metFORMIN  (GLUCOPHAGE-XR) 500 MG 24 hr tablet Take 2 tablets (1,000 mg total) by mouth in the morning and at bedtime.   . montelukast (SINGULAIR) 10 MG tablet Take 1 tablet (10 mg total) by mouth at bedtime.   . Multiple Vitamin (MULTIVITAMIN WITH MINERALS) TABS tablet Take 1 tablet by mouth daily.   . naproxen (NAPROSYN) 500 MG tablet Take 1 tablet (500 mg total) by mouth 2 (two) times daily with a meal.   . oxybutynin (DITROPAN-XL) 10 MG 24 hr tablet TAKE 1 TABLET(10 MG) BY MOUTH AT BEDTIME    No facility-administered encounter medications on file as of 07/15/2020.     Objective:   Goals Addressed              This Visit's Progress     Patient Stated   .  PharmD "My medication is expensive" (pt-stated)        CARE PLAN ENTRY (see longtitudinal plan of care for additional care plan information)  Current Barriers:  . Social, financial, or community barriers: o Patient called today noting that she was being charged $55 for Repatha, which should be $0 with the combination of [employer] and [employer] . Polypharmacy; complex patient with multiple comorbidities including HLD, PVD o HLD: Repatha 140 mg Q14 days. Last LDL well at goal <70. Continues to deny issues w/ Repatha injections.  - Received approval for [employer]. Total of $2500 over a full year o ASCVD risk reduction: ASA 81 mg daily  o T2DM: last A1c 6.4%,  metformin XR 1000 mg BID o HTN: losartan 50 mg daily, last BP at goal <130/80 o Overactive bladder/hx bladder cancer: oxybutynin XL 10 mg daily; renal CT tomorrow o Depression: escitalopram 10 mg daily o Sciatica: gabapentin 600 mg QPM, baclofen 10 mg QPM PRN, naproxen 500 mg BID; orthopedic appt today, MRI for evaluation next week  Pharmacist Clinical Goal(s):  Marland Kitchen Over the next 90 days, patient will work with PharmD and provider towards optimized medication management  Interventions: . Comprehensive medication review performed, medication list  updated in electronic medical record . Inter-disciplinary care team collaboration (see longitudinal plan of care) . Contacted [facility]. They were only running through [place]. They will run through the same secondary information that they used last month. Counseled patient that she may need to take the billing information from [place] back to the pharmacy. She verbalized understanding  Patient Self Care Activities:  . Patient will take medications as prescribed  Please see past updates related to this goal by clicking on the "Past Updates" button in the selected goal          Plan:  - PharmD will f/u with patient as previously scheduled  [name], PharmD, [place] [phone]

## 2020-07-15 NOTE — Patient Instructions (Signed)
Visit Information  Goals Addressed              This Visit's Progress     Patient Stated   .  PharmD "My medication is expensive" (pt-stated)        CARE PLAN ENTRY (see longtitudinal plan of care for additional care plan information)  Current Barriers:  . Social, financial, or community barriers: o Patient called today noting that she was being charged $55 for Repatha, which should be $0 with the combination of [employer] and [employer] . Polypharmacy; complex patient with multiple comorbidities including HLD, PVD o HLD: Repatha 140 mg Q14 days. Last LDL well at goal <70. Continues to deny issues w/ Repatha injections.  - Received approval for [employer]. Total of $2500 over a full year o ASCVD risk reduction: ASA 81 mg daily  o T2DM: last A1c 6.4%, metformin XR 1000 mg BID o HTN: losartan 50 mg daily, last BP at goal <130/80 o Overactive bladder/hx bladder cancer: oxybutynin XL 10 mg daily; renal CT tomorrow o Depression: escitalopram 10 mg daily o Sciatica: gabapentin 600 mg QPM, baclofen 10 mg QPM PRN, naproxen 500 mg BID; orthopedic appt today, MRI for evaluation next week  Pharmacist Clinical Goal(s):  Marland Kitchen Over the next 90 days, patient will work with PharmD and provider towards optimized medication management  Interventions: . Comprehensive medication review performed, medication list updated in electronic medical record . Inter-disciplinary care team collaboration (see longitudinal plan of care) . Contacted [facility]. They were only running through [place]. They will run through the same secondary information that they used last month. Counseled patient that she may need to take the billing information from [place] back to the pharmacy. She verbalized understanding  Patient Self Care Activities:  . Patient will take medications as prescribed  Please see past updates related to this goal by clicking on the "Past Updates" button in the  selected goal         The patient verbalized understanding of instructions provided today and declined a print copy of patient instruction materials.  Plan:  - PharmD will f/u with patient as previously scheduled  [name], PharmD, [place] [phone]

## 2020-07-16 ENCOUNTER — Telehealth: Payer: Self-pay | Admitting: *Deleted

## 2020-07-16 NOTE — Telephone Encounter (Addendum)
Patient informed, voiced understanding.  ----- Message from Hollice Espy, MD sent at 07/16/2020 10:28 AM EDT ----- CT scan as expected shows this renal mass in question.  No surprises.  F/u with intervention radiology as scheduled.  Hollice Espy, MD

## 2020-07-20 ENCOUNTER — Encounter: Payer: Self-pay | Admitting: *Deleted

## 2020-07-20 ENCOUNTER — Ambulatory Visit
Admission: RE | Admit: 2020-07-20 | Discharge: 2020-07-20 | Disposition: A | Discharge status: Home / Self Care | Payer: Medicare Other | Source: Ambulatory Visit | Attending: Urology | Admitting: Urology

## 2020-07-20 ENCOUNTER — Other Ambulatory Visit: Payer: Self-pay

## 2020-07-20 DIAGNOSIS — Z8551 Personal history of malignant neoplasm of bladder: Secondary | ICD-10-CM | POA: Diagnosis not present

## 2020-07-20 DIAGNOSIS — N2889 Other specified disorders of kidney and ureter: Secondary | ICD-10-CM | POA: Diagnosis not present

## 2020-07-20 HISTORY — PX: IR RADIOLOGIST EVAL & MGMT: IMG5224

## 2020-07-20 NOTE — Consult Note (Signed)
Chief Complaint: Right renal mass  Referring Physician(s): Brandon,Ashley  History of Present Illness: Stacy Mercado is a 74 y.o. female with past medical history significant for diabetes, hypertension, hyperlipidemia and bladder cancer who has has long history of right-sided renal lesion which has been treated conservatively with active surveillance.  Given recent more rapid growth rate, the patient has been referred to interventional radiology for consideration of right-sided renal cryoablation.  Patient remains asymptomatic in regards to this right-sided renal lesion.  Specifically, no flank pain or hematuria.  The patient wishes to avoid undergoing a resection.   Past Medical History:  Diagnosis Date  . Arthritis   . Bladder cancer ([place]) 2017  . Chronic low back pain   . Depression   . Diabetes mellitus without complication ([place])   . Hyperlipidemia 08/07/2016  . Hypertension   . Personal history of chemotherapy    Bladder Ca  . PVD (peripheral vascular disease) ([place])   . Urge incontinence of urine 08/07/2016    Past Surgical History:  Procedure Laterality Date  . ABDOMINAL HYSTERECTOMY    . APPENDECTOMY  1961  . bladder cancer removed  2014  . CATARACT EXTRACTION W/ INTRAOCULAR LENS  IMPLANT, BILATERAL    . complete hysterectomy  1999  . DILATION AND CURETTAGE OF UTERUS  1999  . L rotator cuff Left 2010  . R thumb ruptured ligament and tendon  2012  . REVERSE SHOULDER ARTHROPLASTY Right 11/20/2017   Procedure: REVERSE SHOULDER ARTHROPLASTY;  Surgeon: Corky Mull, MD;  Location: [facility] ORS;  Service: Orthopedics;  Laterality: Right;  . SHOULDER ARTHROSCOPY WITH BICEPSTENOTOMY Right 01/30/2017   Procedure: SHOULDER ARTHROSCOPY WITH BICEPSTENOTOMY;  Surgeon: Corky Mull, MD;  Location: [facility] ORS;  Service: Orthopedics;  Laterality: Right;  biceps tenolysis  . SHOULDER ARTHROSCOPY WITH OPEN ROTATOR CUFF REPAIR Right 01/30/2017   Procedure: SHOULDER ARTHROSCOPY WITH  OPEN ROTATOR CUFF REPAIR;  Surgeon: Corky Mull, MD;  Location: [facility] ORS;  Service: Orthopedics;  Laterality: Right;  Massive rotator cuff tear repair    Allergies: Statins  Medications: Prior to Admission medications   Medication Sig Start Date End Date Taking? Authorizing Provider  acetaminophen (TYLENOL) 500 MG tablet Take 1,000 mg every 6 (six) hours as needed by mouth for moderate pain or headache.  08/07/16   Park Liter P, DO  aspirin 81 MG chewable tablet Chew 81 mg by mouth daily.    [provider]  baclofen (LIORESAL) 10 MG tablet Take 1 tablet (10 mg total) by mouth at bedtime. 01/02/20   Johnson, Megan P, DO  escitalopram (LEXAPRO) 10 MG tablet TAKE 1 TABLET(10 MG) BY MOUTH DAILY 07/06/20   Johnson, Megan P, DO  Evolocumab (REPATHA SURECLICK) 101 MG/ML SOAJ Inject 140 mg into the skin every 14 (fourteen) days. 07/06/20   Johnson, Megan P, DO  gabapentin (NEURONTIN) 300 MG capsule Take 1 capsule (300 mg total) by mouth at bedtime. 07/06/20   Johnson, Megan P, DO  levothyroxine (SYNTHROID) 50 MCG tablet TAKE 1 TABLET BY MOUTH DAILY BEFORE BREAKFAST 05/24/20   Johnson, Megan P, DO  Lidocaine-Menthol (ICY HOT LIDOCAINE PLUS MENTHOL EX) Apply 1 application topically 2 (two) times daily as needed (muscle pain).     [provider]  losartan (COZAAR) 50 MG tablet Take 1.5 tablets (75 mg total) by mouth daily. 07/06/20   Johnson, Megan P, DO  metFORMIN (GLUCOPHAGE-XR) 500 MG 24 hr tablet Take 2 tablets (1,000 mg total) by mouth in the morning  and at bedtime. 07/06/20   Johnson, Megan P, DO  montelukast (SINGULAIR) 10 MG tablet Take 1 tablet (10 mg total) by mouth at bedtime. 05/03/20   Park Liter P, DO  Multiple Vitamin (MULTIVITAMIN WITH MINERALS) TABS tablet Take 1 tablet by mouth daily.    [provider]  naproxen (NAPROSYN) 500 MG tablet Take 1 tablet (500 mg total) by mouth 2 (two) times daily with a meal. 07/06/20   Johnson, Megan P, DO  oxybutynin  (DITROPAN-XL) 10 MG 24 hr tablet TAKE 1 TABLET(10 MG) BY MOUTH AT BEDTIME 05/03/20   Hollice Espy, MD     Family History  Problem Relation Age of Onset  . Hypertension Brother   . Diabetes Brother   . Healthy Son   . Healthy Son   . Healthy Daughter   . Kidney cancer Neg Hx   . Bladder Cancer Neg Hx   . Breast cancer Neg Hx     Social History   Socioeconomic History  . Marital status: Divorced    Spouse name: Not on file  . Number of children: Not on file  . Years of education: Not on file  . Highest education level: Not on file  Occupational History  . Not on file  Tobacco Use  . Smoking status: Former Smoker    Years: 50.00    Types: Cigarettes    Quit date: 12/18/2006    Years since quitting: 13.5  . Smokeless tobacco: Never Used  Vaping Use  . Vaping Use: Never used  Substance and Sexual Activity  . Alcohol use: Yes    Alcohol/week: 7.0 standard drinks    Types: 7 Shots of liquor per week    Comment: glass of liquor nightly  . Drug use: No  . Sexual activity: Not on file  Other Topics Concern  . Not on file  Social History Narrative  . Not on file   Social Determinants of Health   Financial Resource Strain: Medium Risk  . Difficulty of Paying Living Expenses: Somewhat hard  Food Insecurity:   . Worried About Charity fundraiser in the Last Year:   . Arboriculturist in the Last Year:   Transportation Needs:   . Film/video editor (Medical):   Marland Kitchen Lack of Transportation (Non-Medical):   Physical Activity:   . Days of Exercise per Week:   . Minutes of Exercise per Session:   Stress:   . Feeling of Stress :   Social Connections:   . Frequency of Communication with Friends and Family:   . Frequency of Social Gatherings with Friends and Family:   . Attends Religious Services:   . Active Member of Clubs or Organizations:   . Attends Archivist Meetings:   Marland Kitchen Marital Status:     ECOG Status: 0 - Asymptomatic  Review of Systems  Review  of Systems: A 12 point ROS discussed and pertinent positives are indicated in the HPI above.  All other systems are negative.  Physical Exam No direct physical exam was performed (except for noted visual exam findings with Video Visits).   Vital Signs: There were no vitals taken for this visit.  Imaging:  CT abdomen and pelvis - 07/14/2020; 12/20/2016  Enhancing right-sided renal lesion has increased in size compared to the 2018 examination, currently measuring 2.5 x 2.5 x 2.3 cm, previously, 1.5 x 1.4 x 1.4 cm with calculated growth rate of approximately 3 mm/year.  The right renal vein appears patent.  No evidence of metastatic disease within the abdomen.   US RENAL  Result Date: 07/06/2020 CLINICAL DATA:  Right renal mass EXAM: RENAL / URINARY TRACT ULTRASOUND COMPLETE COMPARISON:  03/02/2020 FINDINGS: Right Kidney: Renal measurements: 11.1 x 5.0 x 5.9 cm. = volume: 172 mL. No hydronephrosis is noted. There is a 3.1 cm stable mildly hyperechoic mass lesion identified in the mid to lower right kidney similar to that seen on prior exams. Left Kidney: Renal measurements: 10.6 x 5.8 x 5.1 cm. = volume: 166 mL. 1.1 cm cyst is noted in the lower pole of the left kidney medially. Bladder: Appears normal for degree of bladder distention. Other: None. IMPRESSION: Hyperechoic right renal mass measuring 3.1 cm. This is roughly stable given some measuring variations from the prior exam. Again renal cell carcinoma deserves consideration and continued follow-up/further investigation is recommended. No other focal abnormality is noted. Electronically Signed   By: Inez Catalina M.D.   On: 07/06/2020 14:12   CT Abd Wo & W Cm  Result Date: 07/14/2020 CLINICAL DATA:  Follow-up right renal mass. Personal history of bladder carcinoma. EXAM: CT ABDOMEN WITHOUT AND WITH CONTRAST TECHNIQUE: Multidetector CT imaging of the abdomen was performed following the standard protocol before and following the bolus administration  of intravenous contrast. CONTRAST:  147mL OMNIPAQUE IOHEXOL 300 MG/ML  SOLN COMPARISON:  12/20/2016 from [facility] FINDINGS: Lower chest: No acute findings. Hepatobiliary: No hepatic masses identified. Moderate diffuse hepatic steatosis again seen. Gallbladder is unremarkable. No evidence of biliary ductal dilatation. Pancreas:  No mass or inflammatory changes. Spleen:  Within normal limits in size and appearance. Adrenals/Urinary Tract: Normal adrenal glands. A few tiny sub-cm left renal cysts remains stable. No evidence of left renal mass or hydronephrosis. A subcapsular mass demonstrating avid diffuse contrast enhancement is seen in the lateral midpole of the right kidney. This measures 2.5 x 2.3 cm on image 51/6, increased in size from 2.1 x 1.5 cm on prior study. No evidence of renal vein or IVC thrombus. Stomach/Bowel: Visualized portion unremarkable. Vascular/Lymphatic: No pathologically enlarged lymph nodes identified. No abdominal aortic aneurysm. Aortic atherosclerosis noted. Other:  None. Musculoskeletal:  No suspicious bone lesions identified. IMPRESSION: Mild increase in size of 2.5 cm avidly enhancing subcapsular right renal mass, consistent with renal cell carcinoma. No evidence of abdominal metastatic disease. Stable hepatic steatosis. Aortic Atherosclerosis (ICD10-I70.0). Electronically Signed   By: Marlaine Hind M.D.   On: 07/14/2020 15:26    Labs:  CBC: Recent Labs    01/02/20 0929  WBC 7.3  HGB 12.9  HCT 39.4  PLT 174    COAGS: No results for input(s): INR, APTT in the last 8760 hours.  BMP: Recent Labs    01/02/20 0929 05/03/20 1529 07/06/20 1143  NA 141 141 142  K 4.1 4.7 4.4  CL 104 101 103  CO2 25 26 26   GLUCOSE 182* 144* 222*  BUN 17 20 16   CALCIUM 10.3 10.4* 10.3  CREATININE 0.68 0.73 0.75  GFRNONAA 86 81 78  GFRAA 99 93 90    LIVER FUNCTION TESTS: Recent Labs    01/02/20 0929 05/03/20 1529  BILITOT 0.4 0.6  AST 20 19  ALT  29 28  ALKPHOS 93 86  PROT 6.0 6.7  ALBUMIN 3.9 4.0    TUMOR MARKERS: No results for input(s): AFPTM, CEA, CA199, CHROMGRNA in the last 8760 hours.  Assessment and Plan:  Stacy Mercado is a 74 y.o. female with past medical history significant for  diabetes, hypertension, hyperlipidemia and bladder cancer who has has long history of right-sided renal lesion which has been treated conservatively with active surveillance.  Given recent more rapid growth rate, the patient has been referred to interventional radiology for consideration of right-sided renal cryoablation.  Patient remains asymptomatic in regards to this right-sided renal lesion.    Enhancing right-sided renal lesion has increased in size compared to the 2018 examination, currently measuring 2.5 x 2.5 x 2.3 cm, previously, 1.5 x 1.4 x 1.4 cm, with calculated growth rate of approximately 3 mm/year.  The right renal vein appears patent.  No evidence of metastatic disease within the abdomen.  Potential treatment options were discussed at great length with the patient including continued surveillance, definitive surgical resection or renal cryoablation.  At the present time, the patient wishes to avoid a surgical resection.  As such, risks and benefits of image guided renal cryoablation was discussed with the patient including, but not limited to, failure to treat entire lesion, bleeding, infection, damage to adjacent structures, hematuria, urine leak, decrease in renal function and/or post procedural neuropathy. I explained that I often attempt to perform an image guided biopsy at the time of the cryoablation, however I do not perform a biopsy if it would interfere with the intended cryoablation.  I explained the procedure entails an overnight admission to [facility] for continued observation and PCA usage. While the procedure is performed with curative intent, the patient would be followed with surveillance scans every 3  months for the first year followed by biannual scans for the following year and a final scan in 3 years following the procedure to ensure complete treatment of the lesion.  Following this prolonged and detailed conversation, the patient wishes to undergo right-sided renal cryoablation however would like to delay scheduling the procedure until the time has passed for all [place] workers to have received their COVID-19 vaccination. She is very concerned about the number of healthcare workers to come in and out of treatment rooms and her hospital room and the current low vaccine rates of healthcare workers in our area.  As such, I will obtain a follow-up surveillance renal protocol CT scan in 3 months (late October/early November), at which time we will both assess the lesion for interval growth as well as the current state of the pandemic and healthcare worker's vaccination rates.  Plan: -Follow-up consultation following acquisition of surveillance of renal protocol CT scan in 3 months (late October/early November) at which time we will also assess the current state of the pandemic and healthcare worker's vaccination rates.  Thank you for this interesting consult.  I greatly enjoyed meeting TAELER WINNING and look forward to participating in their care.  A copy of this report was sent to the requesting provider on this date.  Electronically Signed: Sandi Mariscal 07/20/2020, 10:06 AM   I spent a total of 40 Minutes in remote  clinical consultation, greater than 50% of which was counseling/coordinating care for right-sided renal cryoablation.    Visit type: Audio only (telephone). Audio (no video) only due to patient's lack of internet/smartphone capability. Alternative for in-person consultation at [facility], [address] [place], [place], [place]. This visit type was conducted due to national recommendations for restrictions regarding the COVID-19 Pandemic (e.g. social distancing).  This  format is felt to be most appropriate for this patient at this time.  All issues noted in this document were discussed and addressed.

## 2020-07-21 ENCOUNTER — Encounter: Payer: Self-pay | Admitting: Urology

## 2020-07-26 ENCOUNTER — Other Ambulatory Visit: Payer: Self-pay

## 2020-07-26 ENCOUNTER — Ambulatory Visit
Admission: RE | Admit: 2020-07-26 | Discharge: 2020-07-26 | Disposition: A | Discharge status: Home / Self Care | Payer: Medicare Other | Source: Ambulatory Visit | Attending: Physical Medicine & Rehabilitation | Admitting: Physical Medicine & Rehabilitation

## 2020-07-26 DIAGNOSIS — M5416 Radiculopathy, lumbar region: Secondary | ICD-10-CM | POA: Insufficient documentation

## 2020-07-26 DIAGNOSIS — M545 Low back pain: Secondary | ICD-10-CM | POA: Diagnosis not present

## 2020-08-02 DIAGNOSIS — M48062 Spinal stenosis, lumbar region with neurogenic claudication: Secondary | ICD-10-CM | POA: Diagnosis not present

## 2020-08-02 DIAGNOSIS — G8929 Other chronic pain: Secondary | ICD-10-CM | POA: Diagnosis not present

## 2020-08-02 DIAGNOSIS — M48061 Spinal stenosis, lumbar region without neurogenic claudication: Secondary | ICD-10-CM | POA: Diagnosis not present

## 2020-08-02 DIAGNOSIS — M5442 Lumbago with sciatica, left side: Secondary | ICD-10-CM | POA: Diagnosis not present

## 2020-09-08 DIAGNOSIS — G8929 Other chronic pain: Secondary | ICD-10-CM | POA: Diagnosis not present

## 2020-09-08 DIAGNOSIS — M5442 Lumbago with sciatica, left side: Secondary | ICD-10-CM | POA: Diagnosis not present

## 2020-09-08 DIAGNOSIS — E118 Type 2 diabetes mellitus with unspecified complications: Secondary | ICD-10-CM | POA: Diagnosis not present

## 2020-09-08 DIAGNOSIS — M48061 Spinal stenosis, lumbar region without neurogenic claudication: Secondary | ICD-10-CM | POA: Diagnosis not present

## 2020-09-08 DIAGNOSIS — M48062 Spinal stenosis, lumbar region with neurogenic claudication: Secondary | ICD-10-CM | POA: Diagnosis not present

## 2020-09-16 ENCOUNTER — Encounter: Payer: Self-pay | Admitting: Family Medicine

## 2020-09-21 ENCOUNTER — Telehealth: Payer: Self-pay | Admitting: Urology

## 2020-09-21 NOTE — Telephone Encounter (Signed)
Pt called office and wants Dr Erlene Quan to order U/S prior to her surgery with a different dr.She said Dr. Erlene Quan wants to make sure spot on kidney hasn't grown any since 06/2020.    Also, she would like some more samples of [name] 50 mg prior to surgery.

## 2020-09-21 NOTE — Telephone Encounter (Signed)
Spoke with patient to get a better clarification. Pt is asking for a ultrasound to see if the lesion has grown from July 2021 to now. Pt states she would like to proceed with the "freezing of the lesion" and doesn't want this to drag on. I advised that maybe Dr. Pascal Lux would or would not need this ultrasound and perhaps she would need to confer with his office. Per pt Dr. Erlene Quan wanted this done ASAP.

## 2020-09-22 ENCOUNTER — Encounter: Payer: Self-pay | Admitting: *Deleted

## 2020-09-22 ENCOUNTER — Other Ambulatory Visit: Payer: Self-pay | Admitting: Interventional Radiology

## 2020-09-22 DIAGNOSIS — M48061 Spinal stenosis, lumbar region without neurogenic claudication: Secondary | ICD-10-CM | POA: Diagnosis not present

## 2020-09-22 DIAGNOSIS — G8929 Other chronic pain: Secondary | ICD-10-CM | POA: Diagnosis not present

## 2020-09-22 DIAGNOSIS — N2889 Other specified disorders of kidney and ureter: Secondary | ICD-10-CM

## 2020-09-22 DIAGNOSIS — M5442 Lumbago with sciatica, left side: Secondary | ICD-10-CM | POA: Diagnosis not present

## 2020-09-22 NOTE — Telephone Encounter (Signed)
Pt walked in asking for samples, per verbal from Dr. Erlene Quan ok to give samples, 50mg  Myrebriq given and pt wanted to know if Dr Erlene Quan wanted her to still have the ultrasound I advised to pt she would need to contact Dr. Pascal Lux office, per pt she didn't have his info, phone number given to Dr. watts

## 2020-09-25 DIAGNOSIS — Z23 Encounter for immunization: Secondary | ICD-10-CM | POA: Diagnosis not present

## 2020-09-30 ENCOUNTER — Other Ambulatory Visit: Payer: Self-pay | Admitting: Family Medicine

## 2020-09-30 ENCOUNTER — Encounter: Payer: Self-pay | Admitting: Family Medicine

## 2020-10-06 ENCOUNTER — Ambulatory Visit: Payer: Medicare Other | Admitting: Pharmacist

## 2020-10-06 DIAGNOSIS — I129 Hypertensive chronic kidney disease with stage 1 through stage 4 chronic kidney disease, or unspecified chronic kidney disease: Secondary | ICD-10-CM

## 2020-10-06 DIAGNOSIS — E119 Type 2 diabetes mellitus without complications: Secondary | ICD-10-CM

## 2020-10-06 DIAGNOSIS — E782 Mixed hyperlipidemia: Secondary | ICD-10-CM

## 2020-10-06 NOTE — Progress Notes (Signed)
[facility]  Name: Stacy Mercado  MRN: [id number] DOB: October 05, 1946   Chief Complaint/ HPI  Stacy Mercado,  74 y.o. , female presents for their Follow-Up CCM visit with the clinical pharmacist via telephone.  PCP : Valerie Roys, DO Patient Care Team: Valerie Roys, DO as PCP - General (Family Medicine)  Their chronic conditions include: Hyperlipidemia, Diabetes, Hypothyroidism, Depression, Osteopenia and PVD OAB with H/O Bladder cancer and rt renal lesion, hypertension  Office Visits: 07/15/20 [name], PharmD- CCM -pt self decreased losartan to 39mg, not checking BP 07/06/20 Dr. JWynetta Emery- labs, referral to Phys med, referral ophthalmology  Consult Visit: 09/21/20- [name] samples from Urology 06/2720 - Dr. MAlba Destine- MRI asses for epidural  injection  Allergies  Allergen Reactions  . Statins Other (See Comments)    myalgia    Medications: Outpatient Encounter Medications as of 10/06/2020  Medication Sig Note  . acetaminophen (TYLENOL) 500 MG tablet Take 1,000 mg every 6 (six) hours as needed by mouth for moderate pain or headache.    .Marland Kitchenaspirin 81 MG chewable tablet Chew 81 mg by mouth daily.   . baclofen (LIORESAL) 10 MG tablet Take 1 tablet (10 mg total) by mouth at bedtime. 03/09/2020: PRN   . escitalopram (LEXAPRO) 10 MG tablet TAKE 1 TABLET(10 MG) BY MOUTH DAILY   . Evolocumab (REPATHA SURECLICK) 1250MG/ML SOAJ Inject 140 mg into the skin every 14 (fourteen) days.   .Marland Kitchengabapentin (NEURONTIN) 300 MG capsule Take 1 capsule (300 mg total) by mouth at bedtime.   .Marland Kitchenlevothyroxine (SYNTHROID) 50 MCG tablet TAKE 1 TABLET BY MOUTH DAILY BEFORE BREAKFAST   . Lidocaine-Menthol (ICY HOT LIDOCAINE PLUS MENTHOL EX) Apply 1 application topically 2 (two) times daily as needed (muscle pain).    .Marland Kitchenlosartan (COZAAR) 50 MG tablet Take 1.5 tablets (75 mg total) by mouth daily. 07/13/2020: Taking losartan 50 mg daily   . metFORMIN (GLUCOPHAGE-XR) 500 MG 24  hr tablet Take 2 tablets (1,000 mg total) by mouth in the morning and at bedtime.   . montelukast (SINGULAIR) 10 MG tablet Take 1 tablet (10 mg total) by mouth at bedtime.   . Multiple Vitamin (MULTIVITAMIN WITH MINERALS) TABS tablet Take 1 tablet by mouth daily.   . naproxen (NAPROSYN) 500 MG tablet Take 1 tablet (500 mg total) by mouth 2 (two) times daily with a meal.   . oxybutynin (DITROPAN-XL) 10 MG 24 hr tablet TAKE 1 TABLET(10 MG) BY MOUTH AT BEDTIME    No facility-administered encounter medications on file as of 10/06/2020.    Wt Readings from Last 3 Encounters:  07/07/20 194 lb (88 kg)  07/06/20 194 lb 12.8 oz (88.4 kg)  05/03/20 197 lb 6.4 oz (89.5 kg)    Current Diagnosis/Assessment:    Goals Addressed   None    Diabetes   A1c goal <7%  Recent Relevant Labs: Lab Results  Component Value Date/Time   HGBA1C 6.4 07/06/2020 11:23 AM   HGBA1C 7.5 (H) 04/01/2020 10:46 AM   HGBA1C 5.3 05/24/2016 12:00 AM   HGBA1C 5.9 12/29/2015 12:00 AM   MICROALBUR 10 01/02/2020 09:23 AM   MICROALBUR 10 03/31/2019 01:51 PM    Last diabetic Eye exam:  Lab Results  Component Value Date/Time   HMDIABEYEEXA No Retinopathy 09/12/2016 04:27 PM    Last diabetic Foot exam: No results found for: HMDIABFOOTEX   Checking BG: Never   Patient has failed these meds in past: NA Patient  is currently controlled on the following medications: Marland Kitchen Metformin XR  10100m bid  We discussed: Diet and exercise. Patient states she his controlled per her A1c readings and does not check BG at home. Reviewed s/sxs of hypoglycemia.  Plan  Continue current medications  Hypertension   BP goal is:  <130/80  Office blood pressures are  BP Readings from Last 3 Encounters:  07/07/20 (!) 154/79  07/06/20 134/75  05/03/20 (!) 161/69   Patient checks BP at home Never Patient home BP readings are ranging: Not checking  Patient has failed these meds in the past: NA Patient is currently query   controlled on the following medications:  . Losartan 50 mg qd  We discussed diet and exercise extensively and the importance of checking BP at home. Reviewed in office readings and goal BP. Encouraged patient to obtain BP monitor and begin checking. Patient is following up with nephrology for renal lesion on rt kidney. Has previously declined intervention d/t COVID concerns  Plan  Continue current medications     OAB/HX bladder Cancer   Patient has failed these meds in past: NA Patient is currently controlled on the following medications:  . Oxybutynin xl 10 mg qd . [name] (samples from urology office)  We discussed:  Next Tuesday for scan of kidney. Then follow up with Dr. WPascal Luxfor biopsy of Kidney lesion. She plans to have Botox injections for OAB and will coordinate depending on renal findings and plan  Plan  Continue current medications   Hyperlipidemia   LDL goal < 70  Lipid Panel     Component Value Date/Time   CHOL 112 05/03/2020 1529   CHOL 211 (H) 08/14/2016 1025   TRIG 117 05/03/2020 1529   TRIG 226 (H) 08/14/2016 1025   HDL 46 05/03/2020 1529   LDLCALC 45 05/03/2020 1529    Hepatic Function Latest Ref Rng & Units 05/03/2020 01/02/2020 03/31/2019  Total Protein 6.0 - 8.5 g/dL 6.7 6.0 6.3  Albumin 3.7 - 4.7 g/dL 4.0 3.9 4.2  AST 0 - 40 IU/L _0 ALT 0 - 32 IU/L _1 Alk Phosphatase 48 - 121 IU/L 86 93 101  Total Bilirubin 0.0 - 1.2 mg/dL 0.6 0.4 0.3     The ASCVD Risk score (Mikey BussingDC Jr., et al., 2013) failed to calculate for the following reasons:   The valid total cholesterol range is 130 to 320 mg/dL   Patient has failed these meds in past: NA Patient is currently controlled on the following medications:  . Repatha 140 mg q14 d  ([employer] funding $2500/year)  We discussed:  Discussed with patient that [employer] no longer available for hypercholesterolemia. Will ask CPA to initiate PAP for Repatha to avoid gap in  therapy.   Plan  Continue current medications  Hypothyroidism   Lab Results  Component Value Date/Time   TSH 3.900 01/02/2020 09:29 AM   TSH 3.240 03/31/2019 01:54 PM    Patient has failed these meds in past: NA Patient is currently controlled on the following medications:  . Levothyroxine 50 mcg qd  We discussed:  Importance of taking at he same time each day.  Plan  Continue current medications  Depression / Anxiety   PHQ9 Score:  PHQ9 SCORE ONLY 04/01/2020 01/02/2020 03/31/2019  PHQ-9 Total Score 2 7 0   GAD7 Score: No flowsheet data found.  Patient has failed these meds in past: NA Patient is currently controlled on the following medications:  .  Escitopram 10 mg qd  Willl discuss at follow up.  Plan  Continue current medications  Sciatica/ Chronic Back pain   Patient has failed these meds in past: NA Patient is currently controlled on the following medications:  . Gabapentin 600 mg qpm . Baclofen 10 mg qpm prn . Naproxen 500 mg bid .   We discussed:  Patient had an epidural  Injection in her back on 09/08/20 by Dr. Alba Destine. She reports marked improvement in her pain. She rarely takes baclofen.   Plan  Continue current medications   Medication Management   Pt uses [facility] for all medications Uses pill box?  Pt endorses 95% compliance  We discussed: Current pharmacy is preferred with insurance plan and patient is satisfied with pharmacy services  Plan  Continue current medication management strategy    Follow up: 3 month phone visit  Junita Push. Kenton Kingfisher PharmD, [facility] [phone]

## 2020-10-12 ENCOUNTER — Ambulatory Visit
Admission: RE | Admit: 2020-10-12 | Discharge: 2020-10-12 | Disposition: A | Discharge status: Home / Self Care | Payer: Medicare Other | Source: Ambulatory Visit | Attending: Interventional Radiology | Admitting: Interventional Radiology

## 2020-10-12 ENCOUNTER — Other Ambulatory Visit: Payer: Self-pay

## 2020-10-12 DIAGNOSIS — K760 Fatty (change of) liver, not elsewhere classified: Secondary | ICD-10-CM | POA: Diagnosis not present

## 2020-10-12 DIAGNOSIS — I700 Atherosclerosis of aorta: Secondary | ICD-10-CM | POA: Diagnosis not present

## 2020-10-12 DIAGNOSIS — N2889 Other specified disorders of kidney and ureter: Secondary | ICD-10-CM | POA: Diagnosis not present

## 2020-10-12 LAB — POCT I-STAT CREATININE: Creatinine, Ser: 0.7 mg/dL (ref 0.44–1.00)

## 2020-10-12 MED ORDER — IOHEXOL 300 MG/ML  SOLN
100.0000 mL | Freq: Once | INTRAMUSCULAR | Status: AC | PRN
  Administered 2020-10-12: 100 mL via INTRAVENOUS

## 2020-10-19 ENCOUNTER — Encounter: Payer: Self-pay | Admitting: Urology

## 2020-10-19 ENCOUNTER — Other Ambulatory Visit: Payer: Self-pay

## 2020-10-19 ENCOUNTER — Ambulatory Visit
Admission: RE | Admit: 2020-10-19 | Discharge: 2020-10-19 | Disposition: A | Discharge status: Home / Self Care | Payer: Medicare Other | Source: Ambulatory Visit | Attending: Interventional Radiology | Admitting: Interventional Radiology

## 2020-10-19 ENCOUNTER — Encounter: Payer: Self-pay | Admitting: *Deleted

## 2020-10-19 DIAGNOSIS — N2889 Other specified disorders of kidney and ureter: Secondary | ICD-10-CM

## 2020-10-19 HISTORY — PX: IR RADIOLOGIST EVAL & MGMT: IMG5224

## 2020-10-19 NOTE — Progress Notes (Signed)
Patient ID: Stacy Mercado, female   DOB: Oct 18, 1946, 74 y.o.   MRN: [id number]          Chief Complaint: Right renal mass  Referring Physician(s): Brandon,Ashley  History of Present Illness:  Stacy Mercado is a 74 y.o. female with past medical history significant for diabetes, hypertension, hyperlipidemia and bladder cancer who has has long history of right-sided renal lesion which has been treated conservatively with active surveillance.    The patient is seen in telemedicine consultation following acquisition of surveillance renal protocol CT scan performed 10/12/2020.  Patient remains asymptomatic in regards to this right-sided renal lesion.  Specifically, no flank pain or hematuria.  The patient wishes to avoid undergoing a resection.   Past Medical History:  Diagnosis Date  . Arthritis   . Bladder cancer ([place]) 2017  . Chronic low back pain   . Depression   . Diabetes mellitus without complication ([place])   . Hyperlipidemia 08/07/2016  . Hypertension   . Personal history of chemotherapy    Bladder Ca  . PVD (peripheral vascular disease) ([place])   . Urge incontinence of urine 08/07/2016    Past Surgical History:  Procedure Laterality Date  . ABDOMINAL HYSTERECTOMY    . APPENDECTOMY  1961  . bladder cancer removed  2014  . CATARACT EXTRACTION W/ INTRAOCULAR LENS  IMPLANT, BILATERAL    . complete hysterectomy  1999  . DILATION AND CURETTAGE OF UTERUS  1999  . IR RADIOLOGIST EVAL & MGMT  07/20/2020  . L rotator cuff Left 2010  . R thumb ruptured ligament and tendon  2012  . REVERSE SHOULDER ARTHROPLASTY Right 11/20/2017   Procedure: REVERSE SHOULDER ARTHROPLASTY;  Surgeon: Corky Mull, MD;  Location: [facility] ORS;  Service: Orthopedics;  Laterality: Right;  . SHOULDER ARTHROSCOPY WITH BICEPSTENOTOMY Right 01/30/2017   Procedure: SHOULDER ARTHROSCOPY WITH BICEPSTENOTOMY;  Surgeon: Corky Mull, MD;  Location: [facility] ORS;  Service: Orthopedics;  Laterality: Right;  biceps  tenolysis  . SHOULDER ARTHROSCOPY WITH OPEN ROTATOR CUFF REPAIR Right 01/30/2017   Procedure: SHOULDER ARTHROSCOPY WITH OPEN ROTATOR CUFF REPAIR;  Surgeon: Corky Mull, MD;  Location: [facility] ORS;  Service: Orthopedics;  Laterality: Right;  Massive rotator cuff tear repair    Allergies: Statins  Medications: Prior to Admission medications   Medication Sig Start Date End Date Taking? Authorizing Provider  acetaminophen (TYLENOL) 500 MG tablet Take 1,000 mg every 6 (six) hours as needed by mouth for moderate pain or headache.  08/07/16   Park Liter P, DO  aspirin 81 MG chewable tablet Chew 81 mg by mouth daily.    [provider]  baclofen (LIORESAL) 10 MG tablet Take 1 tablet (10 mg total) by mouth at bedtime. 01/02/20   Johnson, Megan P, DO  escitalopram (LEXAPRO) 10 MG tablet TAKE 1 TABLET(10 MG) BY MOUTH DAILY 07/06/20   Johnson, Megan P, DO  Evolocumab (REPATHA SURECLICK) 062 MG/ML SOAJ Inject 140 mg into the skin every 14 (fourteen) days. 07/06/20   Johnson, Megan P, DO  gabapentin (NEURONTIN) 300 MG capsule Take 1 capsule (300 mg total) by mouth at bedtime. 07/06/20   Johnson, Megan P, DO  levothyroxine (SYNTHROID) 50 MCG tablet TAKE 1 TABLET BY MOUTH DAILY BEFORE BREAKFAST 05/24/20   Johnson, Megan P, DO  Lidocaine-Menthol (ICY HOT LIDOCAINE PLUS MENTHOL EX) Apply 1 application topically 2 (two) times daily as needed (muscle pain).     [provider]  losartan (COZAAR) 50 MG tablet Take 1.5 tablets (  75 mg total) by mouth daily. 07/06/20   Johnson, Megan P, DO  metFORMIN (GLUCOPHAGE-XR) 500 MG 24 hr tablet Take 2 tablets (1,000 mg total) by mouth in the morning and at bedtime. 07/06/20   Johnson, Megan P, DO  montelukast (SINGULAIR) 10 MG tablet Take 1 tablet (10 mg total) by mouth at bedtime. 05/03/20   Park Liter P, DO  Multiple Vitamin (MULTIVITAMIN WITH MINERALS) TABS tablet Take 1 tablet by mouth daily.    [provider]  Multiple Vitamin (MULTIVITAMIN) tablet  Take 1 tablet by mouth daily.    [provider]  naproxen (NAPROSYN) 500 MG tablet Take 1 tablet (500 mg total) by mouth 2 (two) times daily with a meal. 07/06/20   Johnson, Megan P, DO  oxybutynin (DITROPAN-XL) 10 MG 24 hr tablet TAKE 1 TABLET(10 MG) BY MOUTH AT BEDTIME 05/03/20   Hollice Espy, MD     Family History  Problem Relation Age of Onset  . Hypertension Brother   . Diabetes Brother   . Healthy Son   . Healthy Son   . Healthy Daughter   . Kidney cancer Neg Hx   . Bladder Cancer Neg Hx   . Breast cancer Neg Hx     Social History   Socioeconomic History  . Marital status: Divorced    Spouse name: Not on file  . Number of children: Not on file  . Years of education: Not on file  . Highest education level: Not on file  Occupational History  . Not on file  Tobacco Use  . Smoking status: Former Smoker    Years: 50.00    Types: Cigarettes    Quit date: 12/18/2006    Years since quitting: 13.8  . Smokeless tobacco: Never Used  Vaping Use  . Vaping Use: Never used  Substance and Sexual Activity  . Alcohol use: Yes    Alcohol/week: 7.0 standard drinks    Types: 7 Shots of liquor per week    Comment: glass of liquor nightly  . Drug use: No  . Sexual activity: Not on file  Other Topics Concern  . Not on file  Social History Narrative  . Not on file   Social Determinants of Health   Financial Resource Strain: Medium Risk  . Difficulty of Paying Living Expenses: Somewhat hard  Food Insecurity:   . Worried About Charity fundraiser in the Last Year: Not on file  . Ran Out of Food in the Last Year: Not on file  Transportation Needs:   . Lack of Transportation (Medical): Not on file  . Lack of Transportation (Non-Medical): Not on file  Physical Activity:   . Days of Exercise per Week: Not on file  . Minutes of Exercise per Session: Not on file  Stress:   . Feeling of Stress : Not on file  Social Connections:   . Frequency of Communication with Friends  and Family: Not on file  . Frequency of Social Gatherings with Friends and Family: Not on file  . Attends Religious Services: Not on file  . Active Member of Clubs or Organizations: Not on file  . Attends Archivist Meetings: Not on file  . Marital Status: Not on file    ECOG Status: 0 - Asymptomatic  Review of Systems  Review of Systems: A 12 point ROS discussed and pertinent positives are indicated in the HPI above.  All other systems are negative.  Physical Exam No direct physical exam was performed (  except for noted visual exam findings with Video Visits).   Vital Signs: There were no vitals taken for this visit.  Imaging:  CT abdomen and pelvis - 10/12/2020; 07/14/2020; 12/20/2016  Enhancing right-sided renal lesion has continued to slowly increase in size currently measuring 2.9 x 2.5 x 2.5 cm, previously, 2.9 x 2.5 x 2.3 cm when compared to the 07/07/2019 examination, and 1.5 x 1.4 x 1.4 cm  when compared to the 12/2016 examination with estimated growth rate of approximately 3 mm/year.  The right renal vein appears patent.  No evidence of metastatic disease within the abdomen.  CT ABDOMEN W WO CONTRAST  Result Date: 10/13/2020 CLINICAL DATA:  Follow-up right-sided renal lesion EXAM: CT ABDOMEN WITHOUT AND WITH CONTRAST TECHNIQUE: Multidetector CT imaging of the abdomen was performed following the standard protocol before and following the bolus administration of intravenous contrast. CONTRAST:  151mL OMNIPAQUE IOHEXOL 300 MG/ML  SOLN COMPARISON:  07/14/2020, 12/20/2016 FINDINGS: Lower chest: No acute abnormality. Three-vessel coronary artery calcifications and/or stents. Hepatobiliary: No focal liver abnormality is seen. Hepatic steatosis. No gallstones, gallbladder wall thickening, or biliary dilatation. Pancreas: Unremarkable. No pancreatic ductal dilatation or surrounding inflammatory changes. Spleen: Normal in size without focal abnormality. Adrenals/Urinary Tract:  Adrenal glands are unremarkable. No significant interval change in a partially exophytic contrast enhancing mass of the lateral midportion of the right kidney measuring 2.5 x 2.4 cm, which has however, as previously noted, significantly increased in size compared prior examination dated 2018 (series 6, image 54). Stomach/Bowel: Stomach is within normal limits. Appendix appears normal. No evidence of bowel wall thickening, distention, or inflammatory changes. Vascular/Lymphatic: Aortic atherosclerosis. No enlarged abdominal lymph nodes. Other: No abdominal wall hernia or abnormality. Musculoskeletal: No acute or significant osseous findings. IMPRESSION: 1. No significant interval change in a partially exophytic contrast enhancing mass of the lateral midportion of the right kidney measuring 2.5 x 2.4 cm, which has however, as previously noted, significantly increased in size compared prior examination dated 2018. Findings remain consistent with renal cell carcinoma. No evidence of lymphadenopathy or metastatic disease in the abdomen. 2. Hepatic steatosis. 3. Coronary artery disease.  Aortic Atherosclerosis (ICD10-I70.0). Electronically Signed   By: Eddie Candle M.D.   On: 10/13/2020 08:17    Labs:  CBC: Recent Labs    01/02/20 0929  WBC 7.3  HGB 12.9  HCT 39.4  PLT 174    COAGS: No results for input(s): INR, APTT in the last 8760 hours.  BMP: Recent Labs    01/02/20 0929 05/03/20 1529 07/06/20 1143 10/12/20 1443  NA 141 141 142  --   K 4.1 4.7 4.4  --   CL 104 101 103  --   CO2 25 26 26   --   GLUCOSE 182* 144* 222*  --   BUN 17 20 16   --   CALCIUM 10.3 10.4* 10.3  --   CREATININE 0.68 0.73 0.75 0.70  GFRNONAA 86 81 78  --   GFRAA 99 93 90  --     LIVER FUNCTION TESTS: Recent Labs    01/02/20 0929 05/03/20 1529  BILITOT 0.4 0.6  AST 20 19  ALT 29 28  ALKPHOS 93 86  PROT 6.0 6.7  ALBUMIN 3.9 4.0    TUMOR MARKERS: No results for input(s): AFPTM, CEA, CA199, CHROMGRNA in  the last 8760 hours.  Assessment and Plan:  JOSELYNN AMOROSO is a 74 y.o. female with past medical history significant for diabetes, hypertension, hyperlipidemia and bladder cancer who has  has long history of right-sided renal lesion which has been treated conservatively with active surveillance.   The patient remains asymptomatic in regards to this right-sided renal lesion.  The following examinations were personally reviewed: CT abdomen and pelvis - 10/12/2020; 07/14/2020; 12/20/2016  Enhancing right-sided renal lesion has continued to slowly increase in size currently measuring 2.9 x 2.5 x 2.5 cm, previously, 2.9 x 2.5 x 2.3 cm when compared to the 07/07/2019 examination, and 1.5 x 1.4 x 1.4 cm when compared to the 12/2016 examination with estimated growth rate of approximately 3 mm/year.     Again, the patient wishes to avoid a surgical resection.  While prior conversations were held with the patient regarding the risks and benefits of renal cryoablation during our initial consultation on 07/20/2020, at that time, the patient did not wish to undergo the intervention given her concern with the lack of healthcare workers who had received their COVID-19 vaccination. I explained that the vaccination mandate timeframe has passed and at this time all members of the hospital staff have either received the vaccination or been granted an exception.  As such, risks and benefits of image guided renal cryoablation was again discussed with the patient including, but not limited to, failure to treat entire lesion, bleeding, infection, damage to adjacent structures, hematuria, urine leak, decrease in renal function and/or post procedural neuropathy. I explained that I often attempt to perform an image guided biopsy at the time of the cryoablation, however I do not perform a biopsy if it would interfere with the intended cryoablation.  I explained that as the lesion has demonstrated continued slow interval growth  and now approaches the 3 cm threshold I feel we should consider proceeding with attempted cryoablation as if the lesion grows larger than 3 cm the risk of an incomplete ablation is higher.   I explained the procedure entails an overnight admission to [facility] for continued observation and PCA usage. While the procedure is performed with curative intent, the patient would be followed with surveillance scans every 3 months for the first year followed by biannual scans for the following year and a final scan in 3 years following the procedure to ensure complete treatment of the lesion.  Following this prolonged conversation the patient now agrees to pursue right-sided renal cryoablation.  Plan: -Schedule right-sided renal cryoablation of potential biopsy at [facility]. Again, the procedure will entail an overnight admission for continued observation and PCA usage.  The patient knows to call the interventional radiology clinic with any future questions or concerns.  A copy of this report was sent to the requesting provider on this date.  Electronically Signed: Sandi Mariscal 10/19/2020, 12:42 PM   I spent a total of 15 Minutes in remote  clinical consultation, greater than 50% of which was counseling/coordinating care for right sided renal lesion.    Visit type: Audio only (telephone). Audio (no video) only due to patient's lack of internet/smartphone capability. Alternative for in-person consultation at [address], [address], [place], [place]. This visit type was conducted due to national recommendations for restrictions regarding the COVID-19 Pandemic (e.g. social distancing).  This format is felt to be most appropriate for this patient at this time.  All issues noted in this document were discussed and addressed.

## 2020-10-21 ENCOUNTER — Encounter: Payer: Self-pay | Admitting: Urology

## 2020-10-21 ENCOUNTER — Other Ambulatory Visit (HOSPITAL_COMMUNITY): Payer: Self-pay | Admitting: Interventional Radiology

## 2020-10-21 DIAGNOSIS — N289 Disorder of kidney and ureter, unspecified: Secondary | ICD-10-CM

## 2020-10-22 ENCOUNTER — Other Ambulatory Visit: Payer: Self-pay | Admitting: Radiology

## 2020-10-22 DIAGNOSIS — R32 Unspecified urinary incontinence: Secondary | ICD-10-CM

## 2020-10-22 DIAGNOSIS — N3281 Overactive bladder: Secondary | ICD-10-CM

## 2020-10-22 MED ORDER — ONABOTULINUMTOXINA 100 UNITS IJ SOLR: 100.0000 [IU] | INTRAMUSCULAR | Status: AC

## 2020-10-24 NOTE — Patient Instructions (Addendum)
Visit Information  It was a pleasure speaking with you today. Thank you for letting me be part of your clinical team. Please call with any questions or concerns.   Goals Addressed            This Visit's Progress   . Pharmacy care plan       CARE PLAN ENTRY (see longitudinal plan of care for additional care plan information)  Current Barriers:  . Chronic Disease Management support, education, and care coordination needs related to Hypertension, Hyperlipidemia, Diabetes, Hypothyroidism, Depression, Osteopenia, and Overactive Bladder   Hypertension BP Readings from Last 3 Encounters:  07/07/20 (!) 154/79  07/06/20 134/75  05/03/20 (!) 161/69   . Pharmacist Clinical Goal(s): o Over the next 90 days, patient will work with PharmD and providers to achieve BP goal <130/80 . Current regimen:  o Losartan 50 mg daily . Interventions: o Reviewed goal BP  o Reviewed proper BP technique and encouraged patient to check at home . Patient self care activities - Over the next 90 days, patient will: o Check BP 2-3 times weekly, document, and provide at future appointments o Ensure daily salt intake < 2300 mg/day  Hyperlipidemia Lab Results  Component Value Date/Time   LDLCALC 45 05/03/2020 03:29 PM   . Pharmacist Clinical Goal(s): o Over the next 90 days, patient will work with PharmD and providers to maintain LDL goal < 70 . Current regimen:  o Repatha 140 mg every 14 days . Interventions: o Discussed unavailability of [place] for next year o Reviewed PAP option for medication o Will  have CPA complete PAP . Patient self care activities - Over the next 90 days, patient will: o Continue to focus on healthy diet o Provide required portion of PAP documentation  Diabetes Lab Results  Component Value Date/Time   HGBA1C 6.4 07/06/2020 11:23 AM   HGBA1C 7.5 (H) 04/01/2020 10:46 AM   HGBA1C 5.3 05/24/2016 12:00 AM   HGBA1C 5.9 12/29/2015 12:00 AM   . Pharmacist Clinical  Goal(s): o Over the next 90 days, patient will work with PharmD and providers to maintain A1c goal <7% . Current regimen:  o Metformin XR 1000 mg bid . Interventions: o Reviewed goal glucose readings for an A1c of <7%, we want to see fasting sugars <130 and 2 hour after meal sugars <180.  o Encouraged patient to consider checking at home o Reviewed signs and symptoms of hypoglycemia . Patient self care activities - Over the next 90 days, patient will: o Check blood sugar  at office visits o Contact provider with any episodes of hypoglycemia  Medication management . Pharmacist Clinical Goal(s): o Over the next 90 days, patient will work with PharmD and providers to achieve optimal medication adherence . Current pharmacy: [facility] . Interventions o Comprehensive medication review performed. o Utilize [facility] for medication synchronization, packaging and delivery . Patient self care activities - Over the next 90 days, patient will: o Focus on medication adherence by fill dates o Take medications as prescribed o Report any questions or concerns to PharmD and/or provider(s)  Initial goal documentation        The patient verbalized understanding of instructions provided today and agreed to receive a mailed copy of patient instruction and/or educational materials.  The pharmacy team will reach out to the patient again over the next 30-60 days for Repatha PAP days.   Junita Push. Kenton Kingfisher PharmD, BCPS Clinical Pharmacist [phone]  Preventing High Cholesterol Cholesterol is a white, waxy  substance similar to fat that the human body needs to help build cells. The liver makes all the cholesterol that a person's body needs. Having high cholesterol (hypercholesterolemia) increases a person's risk for heart disease and stroke. Extra (excess) cholesterol comes from the food the person eats. High cholesterol can often be prevented with diet and lifestyle changes. If you already have  high cholesterol, you can control it with diet and lifestyle changes and with medicine. How can high cholesterol affect me? If you have high cholesterol, deposits (plaques) may build up on the walls of your arteries. The arteries are the blood vessels that carry blood away from your heart. Plaques make the arteries narrower and stiffer. This can limit or block blood flow and cause blood clots to form. Blood clots:  Are tiny balls of cells that form in your blood.  Can move to the heart or brain, causing a heart attack or stroke. Plaques in arteries greatly increase your risk for heart attack and stroke.Making diet and lifestyle changes can reduce your risk for these conditions that may threaten your life. What can increase my risk? This condition is more likely to develop in people who:  Eat foods that are high in saturated fat or cholesterol. Saturated fat is mostly found in: ? Foods that contain animal fat, such as red meat and some dairy products. ? Certain fatty foods made from plants, such as tropical oils.  Are overweight.  Are not getting enough exercise.  Have a family history of high cholesterol. What actions can I take to prevent this? Nutrition   Eat less saturated fat.  Avoid trans fats (partially hydrogenated oils). These are often found in margarine and in some baked goods, fried foods, and snacks bought in packages.  Avoid precooked or cured meat, such as sausages or meat loaves.  Avoid foods and drinks that have added sugars.  Eat more fruits, vegetables, and whole grains.  Choose healthy sources of protein, such as fish, poultry, lean cuts of red meat, beans, peas, lentils, and nuts.  Choose healthy sources of fat, such as: ? Nuts. ? Vegetable oils, especially olive oil. ? Fish that have healthy fats (omega-3 fatty acids), such as mackerel or salmon. The items listed above may not be a complete list of recommended foods and beverages. Contact a dietitian for  more information. Lifestyle  Lose weight if you are overweight. Losing 5-10 lb (2.3-4.5 kg) can help prevent or control high cholesterol. It can also lower your risk for diabetes and high blood pressure. Ask your health care provider to help you with a diet and exercise plan to lose weight safely.  Do not use any products that contain nicotine or tobacco, such as cigarettes, e-cigarettes, and chewing tobacco. If you need help quitting, ask your health care provider.  Limit your alcohol intake. ? Do not drink alcohol if:  Your health care provider tells you not to drink.  You are pregnant, may be pregnant, or are planning to become pregnant. ? If you drink alcohol:  Limit how much you use to:  0-1 drink a day for women.  0-2 drinks a day for men.  Be aware of how much alcohol is in your drink. In the [place] one drink equals one 12 oz bottle of beer (355 mL), one 5 oz glass of wine (148 mL), or one 1 oz glass of hard liquor (44 mL). Activity   Get enough exercise. Each week, do at least 150 minutes of exercise that takes  a medium level of effort (moderate-intensity exercise). ? This is exercise that:  Makes your heart beat faster and makes you breathe harder than usual.  Allows you to still be able to talk. ? You could exercise in short sessions several times a day or longer sessions a few times a week. For example, on 5 days each week, you could walk fast or ride your bike 3 times a day for 10 minutes each time.  Do exercises as told by your health care provider. Medicines  In addition to diet and lifestyle changes, your health care provider may recommend medicines to help lower cholesterol. This may be a medicine to lower the amount of cholesterol your liver makes. You may need medicine if: ? Diet and lifestyle changes do not lower your cholesterol enough. ? You have high cholesterol and other risk factors for heart disease or stroke.  Take over-the-counter and prescription  medicines only as told by your health care provider. General information  Manage your risk factors for high cholesterol. Talk with your health care provider about all your risk factors and how to lower your risk.  Manage other conditions that you have, such as diabetes or high blood pressure (hypertension).  Have blood tests to check your cholesterol levels at regular points in time as told by your health care provider.  Keep all follow-up visits as told by your health care provider. This is important. Where to find more information  American Heart Association: www.heart.org  National Heart, Lung, and Blood Institute: https://wilson-eaton.com/ Summary  High cholesterol increases your risk for heart disease and stroke. By keeping your cholesterol level low, you can reduce your risk for these conditions.  High cholesterol can often be prevented with diet and lifestyle changes.  Work with your health care provider to manage your risk factors, and have your blood tested regularly. This information is not intended to replace advice given to you by your health care provider. Make sure you discuss any questions you have with your health care provider. Document Revised: 03/28/2019 Document Reviewed: 08/12/2016 Elsevier Patient Education  2020 [employer].

## 2020-10-25 ENCOUNTER — Other Ambulatory Visit (HOSPITAL_COMMUNITY)
Admission: RE | Admit: 2020-10-25 | Discharge: 2020-10-25 | Disposition: A | Discharge status: Home / Self Care | Payer: Medicare Other | Source: Ambulatory Visit | Attending: Interventional Radiology | Admitting: Interventional Radiology

## 2020-10-25 ENCOUNTER — Other Ambulatory Visit: Payer: Self-pay | Admitting: Radiology

## 2020-10-25 ENCOUNTER — Ambulatory Visit: Payer: Medicare Other

## 2020-10-25 ENCOUNTER — Other Ambulatory Visit: Payer: Self-pay

## 2020-10-25 ENCOUNTER — Encounter (HOSPITAL_COMMUNITY): Payer: Self-pay | Admitting: Interventional Radiology

## 2020-10-25 ENCOUNTER — Encounter (HOSPITAL_COMMUNITY)
Admission: RE | Admit: 2020-10-25 | Discharge: 2020-10-25 | Disposition: A | Discharge status: Home / Self Care | Payer: Medicare Other | Source: Ambulatory Visit | Attending: Interventional Radiology | Admitting: Interventional Radiology

## 2020-10-25 DIAGNOSIS — Z20822 Contact with and (suspected) exposure to covid-19: Secondary | ICD-10-CM | POA: Insufficient documentation

## 2020-10-25 DIAGNOSIS — Z01818 Encounter for other preprocedural examination: Secondary | ICD-10-CM | POA: Insufficient documentation

## 2020-10-25 LAB — HEMOGLOBIN A1C
Hgb A1c MFr Bld: 7.3 % — ABNORMAL HIGH (ref 4.8–5.6)
Mean Plasma Glucose: 162.81 mg/dL

## 2020-10-25 LAB — CBC
HCT: 39.2 % (ref 36.0–46.0)
Hemoglobin: 12.5 g/dL (ref 12.0–15.0)
MCH: 29 pg (ref 26.0–34.0)
MCHC: 31.9 g/dL (ref 30.0–36.0)
MCV: 91 fL (ref 80.0–100.0)
Platelets: 180 10*3/uL (ref 150–400)
RBC: 4.31 MIL/uL (ref 3.87–5.11)
RDW: 14 % (ref 11.5–15.5)
WBC: 5.9 10*3/uL (ref 4.0–10.5)
nRBC: 0 % (ref 0.0–0.2)

## 2020-10-25 LAB — BASIC METABOLIC PANEL
Anion gap: 7 (ref 5–15)
BUN: 20 mg/dL (ref 8–23)
CO2: 29 mmol/L (ref 22–32)
Calcium: 10.1 mg/dL (ref 8.9–10.3)
Chloride: 105 mmol/L (ref 98–111)
Creatinine, Ser: 0.78 mg/dL (ref 0.44–1.00)
GFR, Estimated: >60 mL/min (ref >60)
Glucose, Bld: 154 mg/dL — ABNORMAL HIGH (ref 70–99)
Potassium: 4.2 mmol/L (ref 3.5–5.1)
Sodium: 141 mmol/L (ref 135–145)

## 2020-10-25 LAB — SARS CORONAVIRUS 2 (TAT 6-24 HRS): SARS Coronavirus 2: NEGATIVE

## 2020-10-25 NOTE — Progress Notes (Signed)
COVID Vaccine Completed: Yes Date COVID Vaccine completed: 01/13/20, 02/03/20, plus booster COVID vaccine manufacturer: Pfizer      PCP - Dr. Park Liter Cardiologist - N/A  Chest x-ray - greater than 1 year in epic EKG - greater than 1 year in epic Stress Test - N/A ECHO - N/A Cardiac Cath - N/A Pacemaker/ICD device last checked:N/A  Sleep Study - N/A CPAP - N/A  Fasting Blood Sugar - N/A Checks Blood Sugar __does not check at home  Blood Thinner Instructions: Aspirin Instructions: N/A Last Dose: N/A  Anesthesia review: N/A  Patient denies shortness of breath, fever, cough and chest pain at PAT appointment   Patient verbalized understanding of instructions that were given to them at the PAT appointment. Patient was also instructed that they will need to review over the PAT instructions again at home before surgery.

## 2020-10-25 NOTE — Telephone Encounter (Signed)
Informed patient [name] samples requested will be left up front for patient to pick up. OK per Dr. Erlene Quan.

## 2020-10-26 ENCOUNTER — Inpatient Hospital Stay: Admission: RE | Admit: 2020-10-26 | Payer: Medicare Other | Source: Ambulatory Visit

## 2020-10-26 NOTE — Anesthesia Preprocedure Evaluation (Addendum)
Anesthesia Evaluation  Patient identified by MRN, date of birth, ID band Patient awake    Reviewed: Allergy & Precautions, NPO status , Patient's Chart, lab work & pertinent test results  Airway Mallampati: II  TM Distance: >3 FB Neck ROM: Full    Dental no notable dental hx. (+) Edentulous Upper, Chipped,    Pulmonary former smoker,    Pulmonary exam normal breath sounds clear to auscultation       Cardiovascular hypertension, + Peripheral Vascular Disease  negative cardio ROS Normal cardiovascular exam Rhythm:Regular Rate:Normal  EKG - NSR R 64   Neuro/Psych  Headaches, PSYCHIATRIC DISORDERS Depression  Neuromuscular disease    GI/Hepatic negative GI ROS, Neg liver ROS,   Endo/Other  diabetes, Type 2, Insulin DependentHypothyroidism   Renal/GU Renal InsufficiencyRenal diseaseK+ 4.2 cR 0.78     Musculoskeletal  (+) Arthritis ,   Abdominal (+) + obese,   Peds  Hematology negative hematology ROS (+) hGB 12.5 Plt 180   Anesthesia Other Findings   Reproductive/Obstetrics                           Anesthesia Physical Anesthesia Plan  ASA: III  Anesthesia Plan: General   Post-op Pain Management:    Induction: Intravenous  PONV Risk Score and Plan: 4 or greater and Treatment may vary due to age or medical condition and Ondansetron  Airway Management Planned: Oral ETT  Additional Equipment: None  Intra-op Plan:   Post-operative Plan: Extubation in OR  Informed Consent: I have reviewed the patients History and Physical, chart, labs and discussed the procedure including the risks, benefits and alternatives for the proposed anesthesia with the patient or authorized representative who has indicated his/her understanding and acceptance.     Dental advisory given  Plan Discussed with: CRNA and Anesthesiologist  Anesthesia Plan Comments:        Anesthesia Quick Evaluation

## 2020-10-27 ENCOUNTER — Encounter (HOSPITAL_COMMUNITY): Payer: Self-pay

## 2020-10-27 ENCOUNTER — Ambulatory Visit (HOSPITAL_COMMUNITY): Payer: Medicare Other | Admitting: Anesthesiology

## 2020-10-27 ENCOUNTER — Encounter (HOSPITAL_COMMUNITY): Payer: Self-pay | Admitting: Interventional Radiology

## 2020-10-27 ENCOUNTER — Other Ambulatory Visit: Payer: Self-pay

## 2020-10-27 ENCOUNTER — Observation Stay (HOSPITAL_COMMUNITY)
Admission: RE | Admit: 2020-10-27 | Discharge: 2020-10-27 | Disposition: A | Discharge status: Home / Self Care | Payer: Medicare Other | Source: Ambulatory Visit | Attending: Interventional Radiology | Admitting: Interventional Radiology

## 2020-10-27 ENCOUNTER — Ambulatory Visit (HOSPITAL_COMMUNITY): Payer: Medicare Other

## 2020-10-27 ENCOUNTER — Observation Stay (HOSPITAL_COMMUNITY)
Admission: RE | Admit: 2020-10-27 | Discharge: 2020-10-28 | Disposition: A | Discharge status: Home / Self Care | Payer: Medicare Other | Attending: Interventional Radiology | Admitting: Interventional Radiology

## 2020-10-27 ENCOUNTER — Encounter (HOSPITAL_COMMUNITY)
Admission: RE | Disposition: A | Discharge status: Home / Self Care | Payer: Self-pay | Source: Home / Self Care | Attending: Interventional Radiology

## 2020-10-27 DIAGNOSIS — D3001 Benign neoplasm of right kidney: Secondary | ICD-10-CM | POA: Diagnosis not present

## 2020-10-27 DIAGNOSIS — N3281 Overactive bladder: Secondary | ICD-10-CM | POA: Insufficient documentation

## 2020-10-27 DIAGNOSIS — I10 Essential (primary) hypertension: Secondary | ICD-10-CM | POA: Diagnosis not present

## 2020-10-27 DIAGNOSIS — Z79890 Hormone replacement therapy (postmenopausal): Secondary | ICD-10-CM | POA: Diagnosis not present

## 2020-10-27 DIAGNOSIS — Z79899 Other long term (current) drug therapy: Secondary | ICD-10-CM | POA: Insufficient documentation

## 2020-10-27 DIAGNOSIS — E1159 Type 2 diabetes mellitus with other circulatory complications: Secondary | ICD-10-CM | POA: Insufficient documentation

## 2020-10-27 DIAGNOSIS — C641 Malignant neoplasm of right kidney, except renal pelvis: Secondary | ICD-10-CM | POA: Diagnosis not present

## 2020-10-27 DIAGNOSIS — J8410 Pulmonary fibrosis, unspecified: Secondary | ICD-10-CM | POA: Diagnosis not present

## 2020-10-27 DIAGNOSIS — E039 Hypothyroidism, unspecified: Secondary | ICD-10-CM | POA: Insufficient documentation

## 2020-10-27 DIAGNOSIS — M1711 Unilateral primary osteoarthritis, right knee: Secondary | ICD-10-CM | POA: Diagnosis not present

## 2020-10-27 DIAGNOSIS — Z01818 Encounter for other preprocedural examination: Secondary | ICD-10-CM

## 2020-10-27 DIAGNOSIS — Z8551 Personal history of malignant neoplasm of bladder: Secondary | ICD-10-CM | POA: Diagnosis not present

## 2020-10-27 DIAGNOSIS — Z7982 Long term (current) use of aspirin: Secondary | ICD-10-CM | POA: Diagnosis not present

## 2020-10-27 DIAGNOSIS — Z23 Encounter for immunization: Secondary | ICD-10-CM | POA: Insufficient documentation

## 2020-10-27 DIAGNOSIS — N289 Disorder of kidney and ureter, unspecified: Secondary | ICD-10-CM

## 2020-10-27 DIAGNOSIS — Z888 Allergy status to other drugs, medicaments and biological substances status: Secondary | ICD-10-CM | POA: Diagnosis not present

## 2020-10-27 DIAGNOSIS — Z7984 Long term (current) use of oral hypoglycemic drugs: Secondary | ICD-10-CM | POA: Diagnosis not present

## 2020-10-27 DIAGNOSIS — N2889 Other specified disorders of kidney and ureter: Secondary | ICD-10-CM | POA: Diagnosis not present

## 2020-10-27 DIAGNOSIS — Z794 Long term (current) use of insulin: Secondary | ICD-10-CM | POA: Diagnosis not present

## 2020-10-27 DIAGNOSIS — E119 Type 2 diabetes mellitus without complications: Secondary | ICD-10-CM | POA: Diagnosis not present

## 2020-10-27 HISTORY — PX: RADIOLOGY WITH ANESTHESIA: SHX6223

## 2020-10-27 HISTORY — DX: Personal history of other diseases of the respiratory system: Z87.09

## 2020-10-27 HISTORY — DX: Personal history of diseases of the blood and blood-forming organs and certain disorders involving the immune mechanism: Z86.2

## 2020-10-27 HISTORY — DX: Hypothyroidism, unspecified: E03.9

## 2020-10-27 HISTORY — DX: Migraine, unspecified, not intractable, without status migrainosus: G43.909

## 2020-10-27 HISTORY — DX: Personal history of pneumonia (recurrent): Z87.01

## 2020-10-27 HISTORY — DX: Personal history of other medical treatment: Z92.89

## 2020-10-27 LAB — TYPE AND SCREEN
ABO/RH(D): A POS
Antibody Screen: NEGATIVE

## 2020-10-27 LAB — CBC WITH DIFFERENTIAL/PLATELET
Abs Immature Granulocytes: 0.02 10*3/uL (ref 0.00–0.07)
Basophils Absolute: 0.1 10*3/uL (ref 0.0–0.1)
Basophils Relative: 1 %
Eosinophils Absolute: 0.2 10*3/uL (ref 0.0–0.5)
Eosinophils Relative: 3 %
HCT: 41.6 % (ref 36.0–46.0)
Hemoglobin: 13.3 g/dL (ref 12.0–15.0)
Immature Granulocytes: 0 %
Lymphocytes Relative: 20 %
Lymphs Abs: 1.3 10*3/uL (ref 0.7–4.0)
MCH: 29.1 pg (ref 26.0–34.0)
MCHC: 32 g/dL (ref 30.0–36.0)
MCV: 91 fL (ref 80.0–100.0)
Monocytes Absolute: 1.2 10*3/uL — ABNORMAL HIGH (ref 0.1–1.0)
Monocytes Relative: 18 %
Neutro Abs: 3.8 10*3/uL (ref 1.7–7.7)
Neutrophils Relative %: 58 %
Platelets: 186 10*3/uL (ref 150–400)
RBC: 4.57 MIL/uL (ref 3.87–5.11)
RDW: 14.2 % (ref 11.5–15.5)
WBC: 6.6 10*3/uL (ref 4.0–10.5)
nRBC: 0 % (ref 0.0–0.2)

## 2020-10-27 LAB — GLUCOSE, CAPILLARY
Glucose-Capillary: 137 mg/dL — ABNORMAL HIGH (ref 70–99)
Glucose-Capillary: 187 mg/dL — ABNORMAL HIGH (ref 70–99)

## 2020-10-27 LAB — PROTIME-INR
INR: 0.9 (ref 0.8–1.2)
Prothrombin Time: 11.8 seconds (ref 11.4–15.2)

## 2020-10-27 LAB — BASIC METABOLIC PANEL
Anion gap: 10 (ref 5–15)
BUN: 19 mg/dL (ref 8–23)
CO2: 27 mmol/L (ref 22–32)
Calcium: 10.2 mg/dL (ref 8.9–10.3)
Chloride: 103 mmol/L (ref 98–111)
Creatinine, Ser: 0.69 mg/dL (ref 0.44–1.00)
GFR, Estimated: >60 mL/min (ref >60)
Glucose, Bld: 149 mg/dL — ABNORMAL HIGH (ref 70–99)
Potassium: 4.5 mmol/L (ref 3.5–5.1)
Sodium: 140 mmol/L (ref 135–145)

## 2020-10-27 SURGERY — IR WITH ANESTHESIA
Anesthesia: General | Laterality: Right

## 2020-10-27 MED ORDER — PROPOFOL 10 MG/ML IV BOLUS
INTRAVENOUS | Status: DC | PRN
  Administered 2020-10-27: 100 mg via INTRAVENOUS

## 2020-10-27 MED ORDER — LEVOTHYROXINE SODIUM 50 MCG PO TABS
50.0000 ug | ORAL_TABLET | Freq: Every day | ORAL | Status: DC
  Administered 2020-10-28: 50 ug via ORAL
  Filled 2020-10-27: qty 1

## 2020-10-27 MED ORDER — LACTATED RINGERS IV SOLN: INTRAVENOUS | Status: DC

## 2020-10-27 MED ORDER — PHENYLEPHRINE HCL-NACL 10-0.9 MG/250ML-% IV SOLN
INTRAVENOUS | Status: DC | PRN
  Administered 2020-10-27: 40 ug/min via INTRAVENOUS

## 2020-10-27 MED ORDER — DIPHENHYDRAMINE HCL 12.5 MG/5ML PO ELIX: 12.5000 mg | ORAL_SOLUTION | Freq: Four times a day (QID) | ORAL | Status: DC | PRN

## 2020-10-27 MED ORDER — SODIUM CHLORIDE 0.9 % IV SOLN
INTRAVENOUS | Status: AC
  Filled 2020-10-27: qty 250

## 2020-10-27 MED ORDER — CEFAZOLIN SODIUM-DEXTROSE 2-4 GM/100ML-% IV SOLN
2.0000 g | INTRAVENOUS | Status: AC
  Administered 2020-10-27: 2 g via INTRAVENOUS

## 2020-10-27 MED ORDER — BACLOFEN 10 MG PO TABS: 10.0000 mg | ORAL_TABLET | Freq: Every evening | ORAL | Status: DC | PRN

## 2020-10-27 MED ORDER — FENTANYL CITRATE (PF) 250 MCG/5ML IJ SOLN
INTRAMUSCULAR | Status: AC
  Filled 2020-10-27: qty 5

## 2020-10-27 MED ORDER — PHENYLEPHRINE 40 MCG/ML (10ML) SYRINGE FOR IV PUSH (FOR BLOOD PRESSURE SUPPORT)
PREFILLED_SYRINGE | INTRAVENOUS | Status: DC | PRN
  Administered 2020-10-27 (×2): 120 ug via INTRAVENOUS
  Administered 2020-10-27: 80 ug via INTRAVENOUS

## 2020-10-27 MED ORDER — ORAL CARE MOUTH RINSE: 15.0000 mL | Freq: Once | OROMUCOSAL | Status: AC

## 2020-10-27 MED ORDER — DIPHENHYDRAMINE HCL 50 MG/ML IJ SOLN: 12.5000 mg | Freq: Four times a day (QID) | INTRAMUSCULAR | Status: DC | PRN

## 2020-10-27 MED ORDER — PNEUMOCOCCAL VAC POLYVALENT 25 MCG/0.5ML IJ INJ
0.5000 mL | INJECTION | INTRAMUSCULAR | Status: AC
  Administered 2020-10-28: 0.5 mL via INTRAMUSCULAR
  Filled 2020-10-27: qty 0.5

## 2020-10-27 MED ORDER — SODIUM CHLORIDE 0.9% FLUSH: 9.0000 mL | INTRAVENOUS | Status: DC | PRN

## 2020-10-27 MED ORDER — ROCURONIUM BROMIDE 10 MG/ML (PF) SYRINGE
PREFILLED_SYRINGE | INTRAVENOUS | Status: DC | PRN
  Administered 2020-10-27: 50 mg via INTRAVENOUS
  Administered 2020-10-27: 20 mg via INTRAVENOUS
  Administered 2020-10-27: 30 mg via INTRAVENOUS

## 2020-10-27 MED ORDER — MONTELUKAST SODIUM 10 MG PO TABS: 10.0000 mg | ORAL_TABLET | Freq: Every evening | ORAL | Status: DC | PRN

## 2020-10-27 MED ORDER — GABAPENTIN 300 MG PO CAPS
300.0000 mg | ORAL_CAPSULE | Freq: Every day | ORAL | Status: DC
  Administered 2020-10-27: 300 mg via ORAL
  Filled 2020-10-27: qty 1

## 2020-10-27 MED ORDER — EVOLOCUMAB 140 MG/ML ~~LOC~~ SOAJ: 140.0000 mg | SUBCUTANEOUS | Status: DC

## 2020-10-27 MED ORDER — ESCITALOPRAM OXALATE 10 MG PO TABS
10.0000 mg | ORAL_TABLET | Freq: Every day | ORAL | Status: DC
  Administered 2020-10-28: 10 mg via ORAL
  Filled 2020-10-27: qty 1

## 2020-10-27 MED ORDER — ADULT MULTIVITAMIN W/MINERALS CH
1.0000 | ORAL_TABLET | Freq: Every day | ORAL | Status: DC
  Administered 2020-10-27 – 2020-10-28 (×2): 1 via ORAL
  Filled 2020-10-27 (×2): qty 1

## 2020-10-27 MED ORDER — ONE-DAILY MULTI VITAMINS PO TABS: 1.0000 | ORAL_TABLET | Freq: Every day | ORAL | Status: DC

## 2020-10-27 MED ORDER — METFORMIN HCL ER 500 MG PO TB24
1000.0000 mg | ORAL_TABLET | Freq: Two times a day (BID) | ORAL | Status: DC
  Administered 2020-10-27 – 2020-10-28 (×2): 1000 mg via ORAL
  Filled 2020-10-27 (×2): qty 2

## 2020-10-27 MED ORDER — SUGAMMADEX SODIUM 200 MG/2ML IV SOLN
INTRAVENOUS | Status: DC | PRN
  Administered 2020-10-27: 200 mg via INTRAVENOUS

## 2020-10-27 MED ORDER — LOSARTAN POTASSIUM 50 MG PO TABS
50.0000 mg | ORAL_TABLET | Freq: Every day | ORAL | Status: DC
  Administered 2020-10-27 – 2020-10-28 (×2): 50 mg via ORAL
  Filled 2020-10-27 (×2): qty 1

## 2020-10-27 MED ORDER — ONDANSETRON HCL 4 MG/2ML IJ SOLN: 4.0000 mg | Freq: Four times a day (QID) | INTRAMUSCULAR | Status: DC | PRN

## 2020-10-27 MED ORDER — DEXAMETHASONE SODIUM PHOSPHATE 10 MG/ML IJ SOLN
INTRAMUSCULAR | Status: DC | PRN
  Administered 2020-10-27: 10 mg via INTRAVENOUS

## 2020-10-27 MED ORDER — CEFAZOLIN SODIUM-DEXTROSE 2-4 GM/100ML-% IV SOLN
INTRAVENOUS | Status: AC
  Filled 2020-10-27: qty 100

## 2020-10-27 MED ORDER — TRAMADOL HCL 50 MG PO TABS
ORAL_TABLET | ORAL | Status: AC
  Filled 2020-10-27: qty 1

## 2020-10-27 MED ORDER — LIDOCAINE 2% (20 MG/ML) 5 ML SYRINGE
INTRAMUSCULAR | Status: DC | PRN
  Administered 2020-10-27: 100 mg via INTRAVENOUS

## 2020-10-27 MED ORDER — ONDANSETRON HCL 4 MG/2ML IJ SOLN: 4.0000 mg | Freq: Once | INTRAMUSCULAR | Status: DC | PRN

## 2020-10-27 MED ORDER — ONDANSETRON HCL 4 MG/2ML IJ SOLN
INTRAMUSCULAR | Status: DC | PRN
  Administered 2020-10-27: 4 mg via INTRAVENOUS

## 2020-10-27 MED ORDER — TRAMADOL HCL 50 MG PO TABS
50.0000 mg | ORAL_TABLET | Freq: Once | ORAL | Status: AC
  Administered 2020-10-27: 50 mg via ORAL

## 2020-10-27 MED ORDER — FENTANYL CITRATE (PF) 100 MCG/2ML IJ SOLN
25.0000 ug | INTRAMUSCULAR | Status: DC | PRN
  Administered 2020-10-27: 25 ug via INTRAVENOUS

## 2020-10-27 MED ORDER — OXYBUTYNIN CHLORIDE ER 10 MG PO TB24
10.0000 mg | ORAL_TABLET | Freq: Every day | ORAL | Status: DC
  Administered 2020-10-27: 10 mg via ORAL
  Filled 2020-10-27: qty 1

## 2020-10-27 MED ORDER — NALOXONE HCL 0.4 MG/ML IJ SOLN: 0.4000 mg | INTRAMUSCULAR | Status: DC | PRN

## 2020-10-27 MED ORDER — FENTANYL CITRATE (PF) 250 MCG/5ML IJ SOLN
INTRAMUSCULAR | Status: DC | PRN
  Administered 2020-10-27: 25 ug via INTRAVENOUS
  Administered 2020-10-27: 50 ug via INTRAVENOUS
  Administered 2020-10-27: 75 ug via INTRAVENOUS

## 2020-10-27 MED ORDER — CHLORHEXIDINE GLUCONATE 0.12 % MT SOLN
15.0000 mL | Freq: Once | OROMUCOSAL | Status: AC
  Administered 2020-10-27: 15 mL via OROMUCOSAL

## 2020-10-27 MED ORDER — FENTANYL CITRATE (PF) 100 MCG/2ML IJ SOLN
INTRAMUSCULAR | Status: AC
  Filled 2020-10-27: qty 2

## 2020-10-27 MED ORDER — ACETAMINOPHEN 10 MG/ML IV SOLN
INTRAVENOUS | Status: AC
  Filled 2020-10-27: qty 100

## 2020-10-27 MED ORDER — HYDROMORPHONE 1 MG/ML IV SOLN
INTRAVENOUS | Status: DC
  Administered 2020-10-27: 2.25 mg via INTRAVENOUS
  Administered 2020-10-27 – 2020-10-28 (×2): 1 mg via INTRAVENOUS
  Administered 2020-10-28: 2 mg via INTRAVENOUS
  Filled 2020-10-27: qty 30

## 2020-10-27 MED ORDER — MIDAZOLAM HCL 2 MG/2ML IJ SOLN
INTRAMUSCULAR | Status: AC
  Filled 2020-10-27: qty 2

## 2020-10-27 MED ORDER — MIDAZOLAM HCL 2 MG/2ML IJ SOLN
INTRAMUSCULAR | Status: DC | PRN
  Administered 2020-10-27: 2 mg via INTRAVENOUS

## 2020-10-27 MED ORDER — ACETAMINOPHEN 10 MG/ML IV SOLN
1000.0000 mg | Freq: Once | INTRAVENOUS | Status: DC | PRN
  Administered 2020-10-27: 1000 mg via INTRAVENOUS

## 2020-10-27 NOTE — Procedures (Signed)
Pre procedural Dx: Right renal lesion Post procedural Dx: Same  Technically successful [place] and CT guided biopsy and cryoablation of right renal lesion.   EBL: Minimal Complications: None immediate.   Ronny Bacon, MD Pager #: [phone]

## 2020-10-27 NOTE — Anesthesia Procedure Notes (Signed)
Procedure Name: Intubation Date/Time: 10/27/2020 8:49 AM Performed by: Sharlette Dense, CRNA Patient Re-evaluated:Patient Re-evaluated prior to induction Oxygen Delivery Method: Circle system utilized Preoxygenation: Pre-oxygenation with 100% oxygen Induction Type: IV induction Ventilation: Mask ventilation without difficulty and Oral airway inserted - appropriate to patient size Laryngoscope Size: Sabra Heck and 2 Grade View: Grade I Tube type: Oral Tube size: 7.5 mm Number of attempts: 1 Airway Equipment and Method: Stylet Placement Confirmation: ETT inserted through vocal cords under direct vision,  positive ETCO2 and breath sounds checked- equal and bilateral Secured at: 21 cm Tube secured with: Tape Dental Injury: Teeth and Oropharynx as per pre-operative assessment

## 2020-10-27 NOTE — Transfer of Care (Signed)
Immediate Anesthesia Transfer of Care Note  Patient: Stacy Mercado  Procedure(s) Performed: IR WITH ANESTHESIA RIGHT RENAL CRYOABLATION (Right )  Patient Location: PACU  Anesthesia Type:General  Level of Consciousness: awake  Airway & Oxygen Therapy: Patient Spontanous Breathing and Patient connected to face mask oxygen  Post-op Assessment: Report given to RN and Post -op Vital signs reviewed and stable  Post vital signs: Reviewed and stable  Last Vitals:  Vitals Value Taken Time  BP 148/63 10/27/20 1130  Temp 36.5 C 10/27/20 1130  Pulse 63 10/27/20 1131  Resp 13 10/27/20 1131  SpO2 100 % 10/27/20 1131  Vitals shown include unvalidated device data.  Last Pain:  Vitals:   10/27/20 0737  TempSrc:   PainSc: 0-No pain      Patients Stated Pain Goal: 3 (83/33/83 2919)  Complications: No complications documented.

## 2020-10-27 NOTE — Progress Notes (Addendum)
Patient ID: Stacy Mercado, female   DOB: 20-Dec-1945, 74 y.o.   MRN: [id number] Patient status post biopsy and cryoablation of right renal mass earlier today via CT guidance; patient remains in PACU awaiting bed availability; currently eating lunch; denies fever, headache, chest pain, dyspnea, abdominal/back pain, nausea, vomiting or bleeding; BP 141/67, heart rate 61, O2 sat 96% room air; yellow urine in Foley cath; plans are for overnight observation; DC Foley later today; check final pathology; follow-up with Dr. Pascal Lux in IR clinic in 4 weeks

## 2020-10-27 NOTE — H&P (Signed)
Referring Physician(s): [place]  Supervising Physician: Sandi Mariscal  Patient Status:  [facility] OP TBA  Chief Complaint:  Right renal mass  Subjective: Patient familiar to IR service from consultation with Dr. Pascal Lux on 07/20/2020 to discuss treatment options for slowly enlarging 2.9 cm right renal mass. She has a past medical history significant for diabetes, depression, anemia, hypertension, hyperlipidemia and bladder cancer. She was deemed an appropriate candidate for CT-guided cryoablation/possible biopsy of the right renal mass and presents today for the procedure. She currently denies fever, headache, chest pain, dyspnea, cough, abdominal pain, nausea, vomiting or bleeding. She does have chronic back pain. Additional history as below.  Past Medical History:  Diagnosis Date  . Arthritis   . Bladder cancer ([place]) 2017  . Chronic low back pain   . Depression   . Diabetes mellitus without complication ([place])   . History of blood transfusion   . History of iron deficiency anemia   . History of pleurisy   . History of pneumonia   . Hyperlipidemia 08/07/2016  . Hypertension   . Hypothyroidism   . Migraines   . Personal history of chemotherapy    Bladder Ca  . Urge incontinence of urine 08/07/2016   Past Surgical History:  Procedure Laterality Date  . ABDOMINAL HYSTERECTOMY    . APPENDECTOMY  1961  . bladder cancer removed  2014  . CATARACT EXTRACTION W/ INTRAOCULAR LENS  IMPLANT, BILATERAL    . complete hysterectomy  1999  . DILATION AND CURETTAGE OF UTERUS  1999  . IR RADIOLOGIST EVAL & MGMT  07/20/2020  . IR RADIOLOGIST EVAL & MGMT  10/19/2020  . L rotator cuff Left 2010  . R thumb ruptured ligament and tendon  2012  . REVERSE SHOULDER ARTHROPLASTY Right 11/20/2017   Procedure: REVERSE SHOULDER ARTHROPLASTY;  Surgeon: Corky Mull, MD;  Location: [facility] ORS;  Service: Orthopedics;  Laterality: Right;  . SHOULDER ARTHROSCOPY WITH BICEPSTENOTOMY Right 01/30/2017   Procedure:  SHOULDER ARTHROSCOPY WITH BICEPSTENOTOMY;  Surgeon: Corky Mull, MD;  Location: [facility] ORS;  Service: Orthopedics;  Laterality: Right;  biceps tenolysis  . SHOULDER ARTHROSCOPY WITH OPEN ROTATOR CUFF REPAIR Right 01/30/2017   Procedure: SHOULDER ARTHROSCOPY WITH OPEN ROTATOR CUFF REPAIR;  Surgeon: Corky Mull, MD;  Location: [facility] ORS;  Service: Orthopedics;  Laterality: Right;  Massive rotator cuff tear repair       Allergies: Statins  Medications: Prior to Admission medications   Medication Sig Start Date End Date Taking? Authorizing Provider  aspirin 81 MG chewable tablet Chew 81 mg by mouth daily.    Yes [provider]  baclofen (LIORESAL) 10 MG tablet Take 1 tablet (10 mg total) by mouth at bedtime. Patient taking differently: Take 10 mg by mouth at bedtime as needed for muscle spasms.  01/02/20  Yes Johnson, Megan P, DO  escitalopram (LEXAPRO) 10 MG tablet TAKE 1 TABLET(10 MG) BY MOUTH DAILY Patient taking differently: Take 10 mg by mouth daily.  07/06/20  Yes Johnson, Megan P, DO  gabapentin (NEURONTIN) 300 MG capsule Take 1 capsule (300 mg total) by mouth at bedtime. 07/06/20  Yes Johnson, Megan P, DO  levothyroxine (SYNTHROID) 50 MCG tablet TAKE 1 TABLET BY MOUTH DAILY BEFORE BREAKFAST Patient taking differently: Take 50 mcg by mouth daily before breakfast.  05/24/20  Yes Johnson, Megan P, DO  losartan (COZAAR) 50 MG tablet Take 1.5 tablets (75 mg total) by mouth daily. Patient taking differently: Take 50 mg by mouth daily.  07/06/20  Yes Johnson, Megan P, DO  metFORMIN (GLUCOPHAGE-XR) 500 MG 24 hr tablet Take 2 tablets (1,000 mg total) by mouth in the morning and at bedtime. 07/06/20  Yes Johnson, Megan P, DO  mirabegron ER ([name]) 50 MG TB24 tablet Take 50 mg by mouth daily.   Yes [provider]  montelukast (SINGULAIR) 10 MG tablet Take 1 tablet (10 mg total) by mouth at bedtime. Patient taking differently: Take 10 mg by mouth at bedtime as needed (allergies).   05/03/20  Yes Johnson, Megan P, DO  Multiple Vitamin (MULTIVITAMIN WITH MINERALS) TABS tablet Take 1 tablet by mouth daily.   Yes [provider]  Multiple Vitamin (MULTIVITAMIN) tablet Take 1 tablet by mouth daily.    Yes [provider]  naproxen (NAPROSYN) 500 MG tablet Take 1 tablet (500 mg total) by mouth 2 (two) times daily with a meal. 07/06/20  Yes Johnson, Megan P, DO  acetaminophen (TYLENOL) 500 MG tablet Take 1,000 mg every 6 (six) hours as needed by mouth for moderate pain or headache.  Patient not taking: Reported on 10/21/2020 08/07/16   Park Liter P, DO  Evolocumab (REPATHA SURECLICK) 448 MG/ML SOAJ Inject 140 mg into the skin every 14 (fourteen) days. 07/06/20   Johnson, Megan P, DO  Lidocaine-Menthol (ICY HOT LIDOCAINE PLUS MENTHOL EX) Apply 1 application topically 2 (two) times daily as needed (muscle pain).  Patient not taking: Reported on 10/21/2020    [provider]  oxybutynin (DITROPAN-XL) 10 MG 24 hr tablet TAKE 1 TABLET(10 MG) BY MOUTH AT BEDTIME Patient taking differently: Take 10 mg by mouth at bedtime.  05/03/20   Hollice Espy, MD     Vital Signs: BP (!) 150/67 (BP Location: Right Arm)   Pulse 67   Temp 98.2 F (36.8 C) (Oral)   Resp 16   Ht 5\' 5"  (1.651 m)   Wt 191 lb 3.2 oz (86.7 kg)   SpO2 97%   BMI 31.82 kg/m   Physical Exam awake, alert. Chest clear to auscultation bilaterally. Heart with regular rate and rhythm. Abdomen soft, positive bowel sounds, nontender. No lower extremity edema.  Imaging: No results found.  Labs:  CBC: Recent Labs    01/02/20 0929 10/25/20 1323  WBC 7.3 5.9  HGB 12.9 12.5  HCT 39.4 39.2  PLT 174 180    COAGS: No results for input(s): INR, APTT in the last 8760 hours.  BMP: Recent Labs    01/02/20 0929 01/02/20 0929 05/03/20 1529 07/06/20 1143 10/12/20 1443 10/25/20 1323  NA 141  --  141 142  --  141  K 4.1  --  4.7 4.4  --  4.2  CL 104  --  101 103  --  105  CO2 25  --  26  26  --  29  GLUCOSE 182*  --  144* 222*  --  154*  BUN 17  --  20 16  --  20  CALCIUM 10.3  --  10.4* 10.3  --  10.1  CREATININE 0.68   < > 0.73 0.75 0.70 0.78  GFRNONAA 86  --  81 78  --  >60  GFRAA 99  --  93 90  --   --    < > = values in this interval not displayed.    LIVER FUNCTION TESTS: Recent Labs    01/02/20 0929 05/03/20 1529  BILITOT 0.4 0.6  AST 20 19  ALT 29 28  ALKPHOS 93 86  PROT 6.0  6.7  ALBUMIN 3.9 4.0    Assessment and Plan: Patient familiar to IR service from consultation with Dr. Pascal Lux on 07/20/2020 to discuss treatment options for slowly enlarging 2.9 cm right renal mass. She has a past medical history significant for diabetes, depression, anemia, hypertension, hyperlipidemia and bladder cancer. She was deemed an appropriate candidate for CT-guided cryoablation/possible biopsy of the right renal mass and presents today for the procedure. Details/risks of procedure, including but not limited to, internal bleeding, infection, injury to adjacent structures, anesthesia related complications discussed with patient with her understanding and consent. Post procedure she will be admitted to the hospital for overnight observation.     Electronically Signed: D. Rowe Robert, PA-C 10/27/2020, 8:01 AM   I spent a total of 30 minutes at the the patient's bedside AND on the patient's hospital floor or unit, greater than 50% of which was counseling/coordinating care for CT-guided cryoablation possible biopsy of right renal mass

## 2020-10-28 ENCOUNTER — Encounter (HOSPITAL_COMMUNITY): Payer: Self-pay | Admitting: Interventional Radiology

## 2020-10-28 ENCOUNTER — Other Ambulatory Visit: Payer: Medicare Other

## 2020-10-28 DIAGNOSIS — C641 Malignant neoplasm of right kidney, except renal pelvis: Secondary | ICD-10-CM | POA: Diagnosis not present

## 2020-10-28 DIAGNOSIS — N3281 Overactive bladder: Secondary | ICD-10-CM | POA: Diagnosis not present

## 2020-10-28 DIAGNOSIS — Z7984 Long term (current) use of oral hypoglycemic drugs: Secondary | ICD-10-CM | POA: Diagnosis not present

## 2020-10-28 DIAGNOSIS — Z7982 Long term (current) use of aspirin: Secondary | ICD-10-CM | POA: Diagnosis not present

## 2020-10-28 DIAGNOSIS — D3001 Benign neoplasm of right kidney: Secondary | ICD-10-CM | POA: Diagnosis not present

## 2020-10-28 DIAGNOSIS — Z9889 Other specified postprocedural states: Secondary | ICD-10-CM | POA: Diagnosis not present

## 2020-10-28 DIAGNOSIS — Z23 Encounter for immunization: Secondary | ICD-10-CM | POA: Diagnosis not present

## 2020-10-28 DIAGNOSIS — Z79890 Hormone replacement therapy (postmenopausal): Secondary | ICD-10-CM | POA: Diagnosis not present

## 2020-10-28 MED ORDER — HYDROCODONE-ACETAMINOPHEN 5-325 MG PO TABS
1.0000 | ORAL_TABLET | Freq: Four times a day (QID) | ORAL | 0 refills | Status: DC | PRN
Start: 2020-10-28 — End: 2021-07-04

## 2020-10-28 MED ORDER — HYDROCODONE-ACETAMINOPHEN 5-325 MG PO TABS: 1.0000 | ORAL_TABLET | Freq: Four times a day (QID) | ORAL | Status: DC | PRN

## 2020-10-28 NOTE — Discharge Summary (Addendum)
Patient ID: Stacy Mercado MRN: [id number] DOB/AGE: 1946/04/15 74 y.o.  Admit date: 10/27/2020 Discharge date: 10/28/2020  Supervising Physician: Sandi Mariscal  Patient Status: [facility] - In-pt  Admission Diagnoses: right renal mass  Discharge Diagnoses: right renal mass, s/p US/CT guided biopsy and cryoablation on 10/27/2020 Active Problems:   Renal cell carcinoma of right kidney [facility])  Past Medical History:  Diagnosis Date  . Arthritis   . Bladder cancer ([place]) 2017  . Chronic low back pain   . Depression   . Diabetes mellitus without complication ([place])   . History of blood transfusion   . History of iron deficiency anemia   . History of pleurisy   . History of pneumonia   . Hyperlipidemia 08/07/2016  . Hypertension   . Hypothyroidism   . Migraines   . Personal history of chemotherapy    Bladder Ca  . Urge incontinence of urine 08/07/2016   Past Surgical History:  Procedure Laterality Date  . ABDOMINAL HYSTERECTOMY    . APPENDECTOMY  1961  . bladder cancer removed  2014  . CATARACT EXTRACTION W/ INTRAOCULAR LENS  IMPLANT, BILATERAL    . complete hysterectomy  1999  . DILATION AND CURETTAGE OF UTERUS  1999  . IR RADIOLOGIST EVAL & MGMT  07/20/2020  . IR RADIOLOGIST EVAL & MGMT  10/19/2020  . L rotator cuff Left 2010  . R thumb ruptured ligament and tendon  2012  . REVERSE SHOULDER ARTHROPLASTY Right 11/20/2017   Procedure: REVERSE SHOULDER ARTHROPLASTY;  Surgeon: Corky Mull, MD;  Location: [facility] ORS;  Service: Orthopedics;  Laterality: Right;  . SHOULDER ARTHROSCOPY WITH BICEPSTENOTOMY Right 01/30/2017   Procedure: SHOULDER ARTHROSCOPY WITH BICEPSTENOTOMY;  Surgeon: Corky Mull, MD;  Location: [facility] ORS;  Service: Orthopedics;  Laterality: Right;  biceps tenolysis  . SHOULDER ARTHROSCOPY WITH OPEN ROTATOR CUFF REPAIR Right 01/30/2017   Procedure: SHOULDER ARTHROSCOPY WITH OPEN ROTATOR CUFF REPAIR;  Surgeon: Corky Mull, MD;  Location: [facility] ORS;  Service: Orthopedics;   Laterality: Right;  Massive rotator cuff tear repair     Discharged Condition: good  Hospital Course: Stacy Mercado is a 74 year old female with past medical history significant for diabetes, depression, anemia, hypertension, hyperlipidemia and bladder cancer.   She was seen in consultation by Dr. Pascal Lux on 07/20/2020 to discuss treatment options for slowly enlarging 2.9 cm right renal mass.  She was deemed an appropriate candidate for US/CT-guided cryoablation /possible biopsy of the renal mass.  On 10/27/2020 she underwent US/CT-guided biopsy and cryoablation of the right renal mass via general anesthesia.  The procedure was performed without immediate complications and she was subsequently admitted to the hospital for overnight observation.  She was placed on Dilaudid PCA pump.  Overnight she did well without any acute complaints with exception of some intermittent coughing.  On the day of discharge she was stable.  She was able to tolerate her diet, void and ambulate without difficulty.  She was deemed stable for discharge home.  Prescription for Vicodin was sent electronically to patient's pharmacy.  She will resume her usual home medications.  She will be scheduled for follow-up with Dr. Pascal Lux in [place] [facility] in 4 weeks(telemedicine).  Follow-up imaging will be performed in 3 months.  She will continue urologic care with Dr. Erlene Quan.  She was told to contact our service in the interim with any additional questions or concerns.  Consults: none  Significant Diagnostic Studies:  Results for orders placed or performed during the hospital encounter  of 94/70/96  Basic metabolic panel  Result Value Ref Range   Sodium 140 135 - 145 mmol/L   Potassium 4.5 3.5 - 5.1 mmol/L   Chloride 103 98 - 111 mmol/L   CO2 27 22 - 32 mmol/L   Glucose, Bld 149 (H) 70 - 99 mg/dL   BUN 19 8 - 23 mg/dL   Creatinine, Ser 0.69 0.44 - 1.00 mg/dL   Calcium 10.2 8.9 - 10.3 mg/dL   GFR, Estimated >60 >60 mL/min   Anion gap 10 5  - 15  CBC with Differential/Platelet  Result Value Ref Range   WBC 6.6 4.0 - 10.5 K/uL   RBC 4.57 3.87 - 5.11 MIL/uL   Hemoglobin 13.3 12.0 - 15.0 g/dL   HCT 41.6 36 - 46 %   MCV 91.0 80.0 - 100.0 fL   MCH 29.1 26.0 - 34.0 pg   MCHC 32.0 30.0 - 36.0 g/dL   RDW 14.2 11.5 - 15.5 %   Platelets 186 150 - 400 K/uL   nRBC 0.0 0.0 - 0.2 %   Neutrophils Relative % 58 %   Neutro Abs 3.8 1.7 - 7.7 K/uL   Lymphocytes Relative 20 %   Lymphs Abs 1.3 0.7 - 4.0 K/uL   Monocytes Relative 18 %   Monocytes Absolute 1.2 (H) 0.1 - 1.0 K/uL   Eosinophils Relative 3 %   Eosinophils Absolute 0.2 0.0 - 0.5 K/uL   Basophils Relative 1 %   Basophils Absolute 0.1 0.0 - 0.1 K/uL   Immature Granulocytes 0 %   Abs Immature Granulocytes 0.02 0.00 - 0.07 K/uL  Protime-INR  Result Value Ref Range   Prothrombin Time 11.8 11.4 - 15.2 seconds   INR 0.9 0.8 - 1.2  Glucose, capillary  Result Value Ref Range   Glucose-Capillary 137 (H) 70 - 99 mg/dL  Glucose, capillary  Result Value Ref Range   Glucose-Capillary 187 (H) 70 - 99 mg/dL  Type and screen  Result Value Ref Range   ABO/RH(D) A POS    Antibody Screen NEG    Sample Expiration      10/30/2020,2359 Performed at [facility], [place] [address], [place], [place] [address]      Treatments: US/CT-guided cryoablation and biopsy of right renal mass on 10/27/2020  Discharge Exam: Blood pressure (!) 107/46, pulse 65, temperature 97.9 F (36.6 C), temperature source Oral, resp. rate 14, height 5\' 5"  (1.651 m), weight 190 lb 3.2 oz (86.3 kg), SpO2 97 %. Awake, alert.  Chest clear to auscultation bilaterally.  Heart with regular rate and rhythm.  Abdomen soft, positive bowel sounds, mildly tender right flank region to palpation.  No obvious bleeding or ecchymosis at puncture sites.  No lower extremity edema.  Disposition: Discharge disposition: 01-Home or Self Care       Discharge Instructions    Call MD for:  difficulty breathing,  headache or visual disturbances   Complete by: As directed    Call MD for:  extreme fatigue   Complete by: As directed    Call MD for:  hives   Complete by: As directed    Call MD for:  persistant dizziness or light-headedness   Complete by: As directed    Call MD for:  persistant nausea and vomiting   Complete by: As directed    Call MD for:  redness, tenderness, or signs of infection (pain, swelling, redness, odor or green/yellow discharge around incision site)   Complete by: As directed  Call MD for:  severe uncontrolled pain   Complete by: As directed    Call MD for:  temperature >100.4   Complete by: As directed    Change dressing (specify)   Complete by: As directed    May Place Band-Aid over puncture site right flank and change daily for next 2-3 days.  May wash site with soap and water.   Diet - low sodium heart healthy   Complete by: As directed    Discharge instructions   Complete by: As directed    May resume home medications; stay well hydrated   Driving Restrictions   Complete by: As directed    No driving for next 24 hours or after taking narcotics   Increase activity slowly   Complete by: As directed    Lifting restrictions   Complete by: As directed    Avoid heavy lifting for the next 3-4 days     Allergies as of 10/28/2020      Reactions   Statins Other (See Comments)   myalgia      Medication List    STOP taking these medications   acetaminophen 500 MG tablet Commonly known as: TYLENOL     TAKE these medications   aspirin 81 MG chewable tablet Chew 81 mg by mouth daily.   baclofen 10 MG tablet Commonly known as: LIORESAL Take 1 tablet (10 mg total) by mouth at bedtime. What changed:   when to take this  reasons to take this   escitalopram 10 MG tablet Commonly known as: LEXAPRO TAKE 1 TABLET(10 MG) BY MOUTH DAILY What changed:   how much to take  how to take this  when to take this  additional instructions   gabapentin 300  MG capsule Commonly known as: NEURONTIN Take 1 capsule (300 mg total) by mouth at bedtime.   HYDROcodone-acetaminophen 5-325 MG tablet Commonly known as: NORCO/VICODIN Take 1-2 tablets by mouth every 6 (six) hours as needed for moderate pain.   ICY HOT LIDOCAINE PLUS MENTHOL EX Apply 1 application topically 2 (two) times daily as needed (muscle pain).   levothyroxine 50 MCG tablet Commonly known as: SYNTHROID TAKE 1 TABLET BY MOUTH DAILY BEFORE BREAKFAST What changed: See the new instructions.   losartan 50 MG tablet Commonly known as: COZAAR Take 1.5 tablets (75 mg total) by mouth daily. What changed: how much to take   metFORMIN 500 MG 24 hr tablet Commonly known as: GLUCOPHAGE-XR Take 2 tablets (1,000 mg total) by mouth in the morning and at bedtime.   montelukast 10 MG tablet Commonly known as: SINGULAIR Take 1 tablet (10 mg total) by mouth at bedtime. What changed:   when to take this  reasons to take this   multivitamin tablet Take 1 tablet by mouth daily.   multivitamin with minerals Tabs tablet Take 1 tablet by mouth daily.   [name] 50 MG Tb24 tablet Generic drug: mirabegron ER Take 50 mg by mouth daily.   naproxen 500 MG tablet Commonly known as: NAPROSYN Take 1 tablet (500 mg total) by mouth 2 (two) times daily with a meal.   oxybutynin 10 MG 24 hr tablet Commonly known as: DITROPAN-XL TAKE 1 TABLET(10 MG) BY MOUTH AT BEDTIME What changed: See the new instructions.   Repatha SureClick 295 MG/ML Soaj Generic drug: Evolocumab Inject 140 mg into the skin every 14 (fourteen) days.            Discharge Care Instructions  (From admission, onward)  Start     Ordered   10/28/20 0000  Change dressing (specify)       Comments: May Place Band-Aid over puncture site right flank and change daily for next 2-3 days.  May wash site with soap and water.   10/28/20 1059          Follow-up Information    Sandi Mariscal, MD Follow up.     Specialties: Interventional Radiology, Radiology Why: radiology service will arrange for follow up with Dr. Pascal Lux in 4 weeks Contact information: [address] [place] [address] [phone]        Hollice Espy, MD Follow up.   Specialty: Urology Why: continue current care with Dr. Erlene Quan as scheduled Contact information: [place] [address] [phone]                Electronically Signed: D. Rowe Robert, PA-C 10/28/2020, 11:01 AM   I have spent Less Than 30 Minutes discharging Stacy Mercado.

## 2020-10-28 NOTE — Discharge Instructions (Signed)
Cryoablation, Care After This sheet gives you information about how to care for yourself after your procedure. Your health care provider may also give you more specific instructions. If you have problems or questions, contact your health care provider. What can I expect after the procedure? After the procedure, it is common to have:  Soreness around the treatment area.  Mild pain and swelling in the treatment area. Follow these instructions at home: Treatment area care   Follow instructions from your health care provider about how to take care of your incision. Make sure you: ? Wash your hands with soap and water before you change your bandage (dressing). If soap and water are not available, use hand sanitizer. ? Change your dressing as told by your health care provider. ? Leave stitches (sutures) in place. They may need to stay in place for 2 weeks or longer.  Check your treatment area every day for signs of infection. Check for: ? More redness, swelling, or pain. ? More fluid or blood. ? Warmth. ? Pus or a bad smell.  Keep the treated area clean, dry, and covered with a dressing until it has healed. Clean the area with soap and water or as told by your health care provider.  You may shower if your health care provider approves. If your bandage gets wet, change it right away. Activity  Follow instructions from your health care provider about any activity limitations.  Do not drive for 24 hours if you received a medicine to help you relax (sedative). General instructions  Take over-the-counter and prescription medicines only as told by your health care provider.  Keep all follow-up visits as told by your health care provider. This is important. Contact a health care provider if:  You do not have a bowel movement for 2 days.  You have nausea or vomiting.  You have more redness, swelling, or pain around your treatment area.  You have more fluid or blood coming from your  treatment area.  Your treatment area feels warm to the touch.  You have pus or a bad smell coming from your treatment area.  You have a fever. Get help right away if:  You have severe pain.  You have trouble swallowing or breathing.  You have severe weakness or dizziness.  You have chest pain or shortness of breath. This information is not intended to replace advice given to you by your health care provider. Make sure you discuss any questions you have with your health care provider. Document Revised: 11/16/2017 Document Reviewed: 05/03/2016 Elsevier Patient Education  2020 [employer]  

## 2020-10-28 NOTE — Progress Notes (Signed)
PCA pump turned off per patient request due to beeping sound when she fell asleep. Patient will call if needed. Charge nurse made aware.

## 2020-10-28 NOTE — Anesthesia Postprocedure Evaluation (Signed)
Anesthesia Post Note  Patient: Stacy Mercado  Procedure(s) Performed: IR WITH ANESTHESIA RIGHT RENAL CRYOABLATION (Right )     Patient location during evaluation: PACU Anesthesia Type: General Level of consciousness: awake and alert Pain management: pain level controlled Vital Signs Assessment: post-procedure vital signs reviewed and stable Respiratory status: spontaneous breathing, nonlabored ventilation, respiratory function stable and patient connected to nasal cannula oxygen Cardiovascular status: blood pressure returned to baseline and stable Postop Assessment: no apparent nausea or vomiting Anesthetic complications: no   No complications documented.  Last Vitals:  Vitals:   10/28/20 0624 10/28/20 0811  BP: 127/70   Pulse: (!) 58   Resp: 18 18  Temp: 36.6 C   SpO2: 95% 99%    Last Pain:  Vitals:   10/28/20 0811  TempSrc:   PainSc: [place]

## 2020-10-28 NOTE — Plan of Care (Signed)

## 2020-10-29 LAB — SURGICAL PATHOLOGY

## 2020-11-01 ENCOUNTER — Other Ambulatory Visit: Payer: Self-pay

## 2020-11-01 ENCOUNTER — Ambulatory Visit (INDEPENDENT_AMBULATORY_CARE_PROVIDER_SITE_OTHER): Payer: Medicare Other

## 2020-11-01 DIAGNOSIS — N3281 Overactive bladder: Secondary | ICD-10-CM | POA: Diagnosis not present

## 2020-11-01 DIAGNOSIS — R300 Dysuria: Secondary | ICD-10-CM | POA: Diagnosis not present

## 2020-11-01 DIAGNOSIS — N898 Other specified noninflammatory disorders of vagina: Secondary | ICD-10-CM | POA: Diagnosis not present

## 2020-11-01 NOTE — Patient Instructions (Signed)
   Step 1 Get all of your supplies ready and place near you. Step 2 Wash your hands or put on gloves. Step 3 Wash around urethral opening with warm antibacterial/ hypoallergenic soapy water from front to back. Step 4 take the catheter out of the package and drain the lubricant over toilet. Step 5 Sit on the toilet and spread your legs to begin catheterization. Step 6 Use your fingers to spread the labia and feel for the urethra.             A MIRROR MAY BE HELPFUL AT FIRST Step 7 Insert the catheter slowly into the urethra. If there is resistance when the catheter reaches the the sphincter muscle,              take a deep breath and gently apply steady pressure.            DO NOT FORCE THE CATHETER Step 8 When the urine begins to flow insert another inch, allow the urine to flow into the toilet. Step 9 When the flow of urine stops, slowly remove the catheter.  

## 2020-11-01 NOTE — Progress Notes (Signed)
Continuous Intermittent Catheterization  Due to upcoming bladder botox patient is present today for a teaching of self I & O Catheterization. Patient was given detailed verbal and printed instructions of self catheterization. Patient was cleaned and prepped in a sterile fashion. With instruction and assistance patient inserted a 14FR and urine return was noted 5ml, urine was yellow in color. Patient tolerated well, no complications were noted. Patient was given a sample bag with supplies to take home. Pt was instructed to only cath in the event she is unable to void after procedure, at which time she should also call the office to make [place] aware. Pt gave verbal understanding.   Preformed by: Gordy Clement, CMA   Additional Notes: RTC as scheduled.

## 2020-11-02 ENCOUNTER — Encounter
Admission: RE | Admit: 2020-11-02 | Discharge: 2020-11-02 | Disposition: A | Discharge status: Home / Self Care | Payer: Medicare Other | Source: Ambulatory Visit | Attending: Urology | Admitting: Urology

## 2020-11-02 ENCOUNTER — Other Ambulatory Visit: Payer: Self-pay

## 2020-11-02 LAB — URINALYSIS, COMPLETE
Bilirubin, UA: NEGATIVE
Glucose, UA: NEGATIVE
Ketones, UA: NEGATIVE
Leukocytes,UA: NEGATIVE
Nitrite, UA: NEGATIVE
Protein,UA: NEGATIVE
Specific Gravity, UA: 1.02 (ref 1.005–1.030)
Urobilinogen, Ur: 0.2 mg/dL (ref 0.2–1.0)
pH, UA: 6 (ref 5.0–7.5)

## 2020-11-02 LAB — MICROSCOPIC EXAMINATION: Bacteria, UA: NONE SEEN

## 2020-11-02 NOTE — Patient Instructions (Addendum)
Your procedure is scheduled on:11-08-20 MONDAY Report to the Registration Desk on the 1st floor of the [facility] To find out your arrival time, please call [phone] between 1PM - 3PM on:11-05-20 FRIDAY  REMEMBER: Instructions that are not followed completely may result in serious medical risk, up to and including death; or upon the discretion of your surgeon and anesthesiologist your surgery may need to be rescheduled.  Do not eat food after midnight the night before surgery.  No gum chewing, lozengers or hard candies.  You may however, drink WATER up to 2 hours before you are scheduled to arrive for your surgery. Do not drink anything within 2 hours of your scheduled arrival time.  Type 1 and Type 2 diabetics should only drink water.   TAKE THESE MEDICATIONS THE MORNING OF SURGERY WITH A SIP OF WATER: -SYNTHROID (LEVOTHYROXINE) -LEXAPRO (ESCITALOPRAM) -YOU MAY TAKE HYDROCODONE IF NEEDED  Stop Metformin 2 days prior to surgery-LAST DOSE ON 11-05-20 FRIDAY  Follow recommendations from Cardiologist, Pulmonologist or PCP regarding stopping Aspirin, Coumadin, Plavix, Eliquis, Pradaxa, or Pletal-ASPIRIN WAS STOPPED PRIOR TO LAST PROCEDURE ON 10-23-20 AND HAS NOT BEEN RESTARTED  One week prior to surgery: Stop Anti-inflammatories (NSAIDS) such as Advil, Aleve, Ibuprofen, Motrin, Naproxen, Naprosyn and Aspirin based products such as Excedrin, Goodys Powder, BC Powder-OK TO TAKE HYDROCODONE/TYLENOL IF NEEDED  Stop ANY OVER THE COUNTER supplements until after surgery. (However, you may continue taking your multivitamin up until the day before surgery.)  No Alcohol for 24 hours before or after surgery.  No Smoking including e-cigarettes for 24 hours prior to surgery.  No chewable tobacco products for at least 6 hours prior to surgery.  No nicotine patches on the day of surgery.  Do not use any "recreational" drugs for at  least a week prior to your surgery.  Please be advised that the combination of cocaine and anesthesia may have negative outcomes, up to and including death. If you test positive for cocaine, your surgery will be cancelled.  On the morning of surgery brush your teeth with toothpaste and water, you may rinse your mouth with mouthwash if you wish. Do not swallow any toothpaste or mouthwash.  Do not wear jewelry, make-up, hairpins, clips or nail polish.  Do not wear lotions, powders, or perfumes.   Do not shave body from the neck down 48 hours prior to surgery just in case you cut yourself which could leave a site for infection.  Also, freshly shaved skin may become irritated if using the CHG soap.  Contact lenses, hearing aids and dentures may not be worn into surgery.  Do not bring valuables to the hospital. [facility] is not responsible for any missing/lost belongings or valuables.   Notify your doctor if there is any change in your medical condition (cold, fever, infection).  Wear comfortable clothing (specific to your surgery type) to the hospital.  Plan for stool softeners for home use; pain medications have a tendency to cause constipation. You can also help prevent constipation by eating foods high in fiber such as fruits and vegetables and drinking plenty of fluids as your diet allows.  After surgery, you can help prevent lung complications by doing breathing exercises.  Take deep breaths and cough every 1-2 hours. Your doctor may order a device called an Incentive Spirometer to help you take deep breaths. When coughing or sneezing, hold a pillow firmly against your incision with both  hands. This is called "splinting." Doing this helps protect your incision. It also decreases belly discomfort.  If you are being admitted to the hospital overnight, leave your suitcase in the car. After surgery it may be brought to your room.  If you are being discharged the day of surgery, you  will not be allowed to drive home. You will need a responsible adult (18 years or older) to drive you home and stay with you that night.   If you are taking public transportation, you will need to have a responsible adult (18 years or older) with you. Please confirm with your physician that it is acceptable to use public transportation.   Please call the [place]. at [phone] if you have any questions about these instructions.  Visitation Policy:  Patients undergoing a surgery or procedure may have one family member or support person with them as long as that person is not COVID-19 positive or experiencing its symptoms.  That person may remain in the waiting area during the procedure.  Inpatient Visitation Update:   In an effort to ensure the safety of our team members and our patients, we are implementing a change to our visitation policy:  Effective Monday, Aug. 9, at 7 a.m., inpatients will be allowed one support person.  o The support person may change daily.  o The support person must pass our screening, gel in and out, and wear a mask at all times, including in the patient's room.  o Patients must also wear a mask when staff or their support person are in the room.  o Masking is required regardless of vaccination status.  Systemwide, no visitors 17 or younger.

## 2020-11-04 ENCOUNTER — Other Ambulatory Visit: Payer: Self-pay

## 2020-11-04 ENCOUNTER — Other Ambulatory Visit
Admission: RE | Admit: 2020-11-04 | Discharge: 2020-11-04 | Disposition: A | Discharge status: Home / Self Care | Payer: Medicare Other | Source: Ambulatory Visit | Attending: Urology | Admitting: Urology

## 2020-11-04 LAB — CULTURE, URINE COMPREHENSIVE

## 2020-11-05 ENCOUNTER — Telehealth: Payer: Self-pay | Admitting: *Deleted

## 2020-11-05 MED ORDER — LIDOCAINE HCL (PF) 2 % IJ SOLN
INTRAMUSCULAR | Status: AC
  Filled 2020-11-05: qty 5

## 2020-11-05 MED ORDER — NITROFURANTOIN MONOHYD MACRO 100 MG PO CAPS: 100.0000 mg | ORAL_CAPSULE | Freq: Two times a day (BID) | ORAL | 0 refills | Status: DC

## 2020-11-05 MED ORDER — PROPOFOL 500 MG/50ML IV EMUL
INTRAVENOUS | Status: AC
  Filled 2020-11-05: qty 100

## 2020-11-05 NOTE — Telephone Encounter (Addendum)
Patient advised, voiced understanding. RX sent in to [facility].  ----- Message from Hollice Espy, MD sent at 11/05/2020  8:00 AM EST ----- Please treat with Macrobid twice daily for a total of seven days.

## 2020-11-07 MED ORDER — SODIUM CHLORIDE 0.9 % IV SOLN: INTRAVENOUS | Status: DC

## 2020-11-07 MED ORDER — FAMOTIDINE 20 MG PO TABS: 20.0000 mg | ORAL_TABLET | Freq: Once | ORAL | Status: AC

## 2020-11-07 MED ORDER — ORAL CARE MOUTH RINSE: 15.0000 mL | Freq: Once | OROMUCOSAL | Status: AC

## 2020-11-07 MED ORDER — DOCUSATE SODIUM 100 MG PO CAPS
100.0000 mg | ORAL_CAPSULE | Freq: Two times a day (BID) | ORAL | Status: DC
  Filled 2020-11-07: qty 1

## 2020-11-07 MED ORDER — CHLORHEXIDINE GLUCONATE 0.12 % MT SOLN: 15.0000 mL | Freq: Once | OROMUCOSAL | Status: AC

## 2020-11-07 MED ORDER — CEFAZOLIN SODIUM-DEXTROSE 2-4 GM/100ML-% IV SOLN
2.0000 g | INTRAVENOUS | Status: AC
  Administered 2020-11-08: 2 g via INTRAVENOUS

## 2020-11-08 ENCOUNTER — Ambulatory Visit
Admission: RE | Admit: 2020-11-08 | Discharge: 2020-11-08 | Disposition: A | Discharge status: Home / Self Care | Payer: Medicare Other | Attending: Urology | Admitting: Urology

## 2020-11-08 ENCOUNTER — Encounter
Admission: RE | Disposition: A | Discharge status: Home / Self Care | Payer: Self-pay | Source: Home / Self Care | Attending: Urology

## 2020-11-08 ENCOUNTER — Ambulatory Visit: Payer: Medicare Other | Admitting: Certified Registered"

## 2020-11-08 ENCOUNTER — Encounter: Payer: Self-pay | Admitting: Urology

## 2020-11-08 ENCOUNTER — Other Ambulatory Visit: Payer: Self-pay

## 2020-11-08 DIAGNOSIS — Z7984 Long term (current) use of oral hypoglycemic drugs: Secondary | ICD-10-CM | POA: Insufficient documentation

## 2020-11-08 DIAGNOSIS — N3941 Urge incontinence: Secondary | ICD-10-CM | POA: Insufficient documentation

## 2020-11-08 DIAGNOSIS — E785 Hyperlipidemia, unspecified: Secondary | ICD-10-CM | POA: Diagnosis not present

## 2020-11-08 DIAGNOSIS — N2889 Other specified disorders of kidney and ureter: Secondary | ICD-10-CM | POA: Insufficient documentation

## 2020-11-08 DIAGNOSIS — Z79899 Other long term (current) drug therapy: Secondary | ICD-10-CM | POA: Insufficient documentation

## 2020-11-08 DIAGNOSIS — Z7982 Long term (current) use of aspirin: Secondary | ICD-10-CM | POA: Diagnosis not present

## 2020-11-08 DIAGNOSIS — Z87891 Personal history of nicotine dependence: Secondary | ICD-10-CM | POA: Insufficient documentation

## 2020-11-08 DIAGNOSIS — R32 Unspecified urinary incontinence: Secondary | ICD-10-CM | POA: Diagnosis not present

## 2020-11-08 DIAGNOSIS — E039 Hypothyroidism, unspecified: Secondary | ICD-10-CM | POA: Diagnosis not present

## 2020-11-08 DIAGNOSIS — Z20822 Contact with and (suspected) exposure to covid-19: Secondary | ICD-10-CM | POA: Insufficient documentation

## 2020-11-08 DIAGNOSIS — Z8551 Personal history of malignant neoplasm of bladder: Secondary | ICD-10-CM | POA: Insufficient documentation

## 2020-11-08 DIAGNOSIS — Z888 Allergy status to other drugs, medicaments and biological substances status: Secondary | ICD-10-CM | POA: Diagnosis not present

## 2020-11-08 DIAGNOSIS — Z79890 Hormone replacement therapy (postmenopausal): Secondary | ICD-10-CM | POA: Diagnosis not present

## 2020-11-08 DIAGNOSIS — E119 Type 2 diabetes mellitus without complications: Secondary | ICD-10-CM | POA: Diagnosis not present

## 2020-11-08 DIAGNOSIS — I10 Essential (primary) hypertension: Secondary | ICD-10-CM | POA: Diagnosis not present

## 2020-11-08 DIAGNOSIS — N3281 Overactive bladder: Secondary | ICD-10-CM | POA: Insufficient documentation

## 2020-11-08 DIAGNOSIS — Z791 Long term (current) use of non-steroidal anti-inflammatories (NSAID): Secondary | ICD-10-CM | POA: Insufficient documentation

## 2020-11-08 HISTORY — PX: BOTOX INJECTION: SHX5754

## 2020-11-08 LAB — GLUCOSE, CAPILLARY
Glucose-Capillary: 225 mg/dL — ABNORMAL HIGH (ref 70–99)
Glucose-Capillary: 254 mg/dL — ABNORMAL HIGH (ref 70–99)

## 2020-11-08 LAB — SARS CORONAVIRUS 2 BY RT PCR (HOSPITAL ORDER, PERFORMED IN ~~LOC~~ HOSPITAL LAB): SARS Coronavirus 2: NEGATIVE

## 2020-11-08 SURGERY — BOTOX INJECTION
Anesthesia: General

## 2020-11-08 MED ORDER — CHLORHEXIDINE GLUCONATE 0.12 % MT SOLN
OROMUCOSAL | Status: AC
  Administered 2020-11-08: 15 mL via OROMUCOSAL
  Filled 2020-11-08: qty 15

## 2020-11-08 MED ORDER — ONDANSETRON HCL 4 MG/2ML IJ SOLN: 4.0000 mg | Freq: Once | INTRAMUSCULAR | Status: DC | PRN

## 2020-11-08 MED ORDER — SODIUM CHLORIDE FLUSH 0.9 % IV SOLN
INTRAVENOUS | Status: AC
  Filled 2020-11-08: qty 20

## 2020-11-08 MED ORDER — SODIUM CHLORIDE (PF) 0.9 % IJ SOLN
INTRAMUSCULAR | Status: DC | PRN
  Administered 2020-11-08: .5 mL

## 2020-11-08 MED ORDER — FAMOTIDINE 20 MG PO TABS
ORAL_TABLET | ORAL | Status: AC
  Administered 2020-11-08: 20 mg via ORAL
  Filled 2020-11-08: qty 1

## 2020-11-08 MED ORDER — PROPOFOL 500 MG/50ML IV EMUL
INTRAVENOUS | Status: AC
  Filled 2020-11-08: qty 50

## 2020-11-08 MED ORDER — CEFAZOLIN SODIUM-DEXTROSE 2-4 GM/100ML-% IV SOLN
INTRAVENOUS | Status: AC
  Filled 2020-11-08: qty 100

## 2020-11-08 MED ORDER — ONABOTULINUMTOXINA 100 UNITS IJ SOLR
INTRAMUSCULAR | Status: DC | PRN
  Administered 2020-11-08: 100 [IU] via INTRAMUSCULAR

## 2020-11-08 MED ORDER — ONDANSETRON HCL 4 MG/2ML IJ SOLN
INTRAMUSCULAR | Status: DC | PRN
  Administered 2020-11-08: 4 mg via INTRAVENOUS

## 2020-11-08 MED ORDER — FENTANYL CITRATE (PF) 100 MCG/2ML IJ SOLN
INTRAMUSCULAR | Status: AC
  Filled 2020-11-08: qty 2

## 2020-11-08 MED ORDER — DEXMEDETOMIDINE (PRECEDEX) IN NS 20 MCG/5ML (4 MCG/ML) IV SYRINGE
PREFILLED_SYRINGE | INTRAVENOUS | Status: DC | PRN
  Administered 2020-11-08: 12 ug via INTRAVENOUS
  Administered 2020-11-08: 8 ug via INTRAVENOUS

## 2020-11-08 MED ORDER — PROPOFOL 500 MG/50ML IV EMUL
INTRAVENOUS | Status: DC | PRN
  Administered 2020-11-08: 140 ug/kg/min via INTRAVENOUS

## 2020-11-08 MED ORDER — ONDANSETRON HCL 4 MG/2ML IJ SOLN
INTRAMUSCULAR | Status: AC
  Filled 2020-11-08: qty 2

## 2020-11-08 MED ORDER — FENTANYL CITRATE (PF) 100 MCG/2ML IJ SOLN: 25.0000 ug | INTRAMUSCULAR | Status: DC | PRN

## 2020-11-08 MED ORDER — FENTANYL CITRATE (PF) 100 MCG/2ML IJ SOLN
INTRAMUSCULAR | Status: DC | PRN
  Administered 2020-11-08 (×2): 50 ug via INTRAVENOUS

## 2020-11-08 SURGICAL SUPPLY — 17 items
BAG DRAIN CYSTO-URO LG1000N (MISCELLANEOUS) ×3 IMPLANT
GLOVE BIO SURGEON STRL SZ 6.5 (GLOVE) ×2 IMPLANT
GLOVE BIO SURGEONS STRL SZ 6.5 (GLOVE) ×1
GOWN STRL REUS W/ TWL LRG LVL3 (GOWN DISPOSABLE) ×2 IMPLANT
GOWN STRL REUS W/TWL LRG LVL3 (GOWN DISPOSABLE) ×6
MANIFOLD NEPTUNE II (INSTRUMENTS) ×3 IMPLANT
NDL SAFETY ECLIPSE 18X1.5 (NEEDLE) ×2 IMPLANT
NEEDLE HYPO 18GX1.5 SHARP (NEEDLE) ×6
NEEDLE INJETAK FLEX 70CM BOTOX (NEEDLE) ×3 IMPLANT
PACK CYSTO AR (MISCELLANEOUS) ×3 IMPLANT
SET CYSTO W/LG BORE CLAMP LF (SET/KITS/TRAYS/PACK) ×3 IMPLANT
SOL .9 NS 3000ML IRR  AL (IV SOLUTION) ×2
SOL .9 NS 3000ML IRR AL (IV SOLUTION) ×1
SOL .9 NS 3000ML IRR UROMATIC (IV SOLUTION) ×1 IMPLANT
SURGILUBE 2OZ TUBE FLIPTOP (MISCELLANEOUS) ×3 IMPLANT
SYR 10ML LL (SYRINGE) ×6 IMPLANT
WATER STERILE IRR 1000ML POUR (IV SOLUTION) ×3 IMPLANT

## 2020-11-08 NOTE — Transfer of Care (Signed)
Immediate Anesthesia Transfer of Care Note  Patient: Stacy Mercado  Procedure(s) Performed: BOTOX INJECTION (N/A )  Patient Location: PACU  Anesthesia Type:General  Level of Consciousness: drowsy  Airway & Oxygen Therapy: Patient Spontanous Breathing and Patient connected to face mask oxygen  Post-op Assessment: Report given to RN  Post vital signs: stable  Last Vitals:  Vitals Value Taken Time  BP 98/49 11/08/20 0809  Temp    Pulse 61 11/08/20 0809  Resp 8 11/08/20 0809  SpO2 98 % 11/08/20 0809  Vitals shown include unvalidated device data.  Last Pain:  Vitals:   11/08/20 0719  TempSrc: Tympanic  PainSc: 0-No pain         Complications: No complications documented.

## 2020-11-08 NOTE — Anesthesia Preprocedure Evaluation (Signed)
Anesthesia Evaluation  Patient identified by MRN, date of birth, ID band Patient awake    Reviewed: Allergy & Precautions, NPO status , Patient's Chart, lab work & pertinent test results  History of Anesthesia Complications Negative for: history of anesthetic complications  Airway Mallampati: II  TM Distance: >3 FB Neck ROM: Full    Dental  (+) Poor Dentition, Missing   Pulmonary neg pulmonary ROS, neg sleep apnea, neg COPD, former smoker,    breath sounds clear to auscultation- rhonchi (-) wheezing      Cardiovascular hypertension, Pt. on medications (-) CAD, (-) Past MI, (-) Cardiac Stents and (-) CABG  Rhythm:Regular Rate:Normal - Systolic murmurs and - Diastolic murmurs    Neuro/Psych  Headaches, neg Seizures PSYCHIATRIC DISORDERS Depression    GI/Hepatic negative GI ROS, Neg liver ROS,   Endo/Other  diabetes, Oral Hypoglycemic AgentsHypothyroidism   Renal/GU Renal disease: renal mass s/p cryoablation.     Musculoskeletal  (+) Arthritis ,   Abdominal (+) + obese,   Peds  Hematology negative hematology ROS (+)   Anesthesia Other Findings Past Medical History: No date: Arthritis 2017: Bladder cancer ([place]) No date: Chronic low back pain No date: Depression No date: Diabetes mellitus without complication (HCC) No date: History of blood transfusion No date: History of iron deficiency anemia No date: History of pleurisy No date: History of pneumonia 08/07/2016: Hyperlipidemia No date: Hypertension No date: Hypothyroidism No date: Migraines No date: Personal history of chemotherapy     Comment:  Bladder Ca 08/07/2016: Urge incontinence of urine   Reproductive/Obstetrics                             Anesthesia Physical Anesthesia Plan  ASA: III  Anesthesia Plan: General   Post-op Pain Management:    Induction: Intravenous  PONV Risk Score and Plan: 2 and Propofol  infusion  Airway Management Planned: Natural Airway  Additional Equipment:   Intra-op Plan:   Post-operative Plan:   Informed Consent: I have reviewed the patients History and Physical, chart, labs and discussed the procedure including the risks, benefits and alternatives for the proposed anesthesia with the patient or authorized representative who has indicated his/her understanding and acceptance.     Dental advisory given  Plan Discussed with: CRNA and Anesthesiologist  Anesthesia Plan Comments:         Anesthesia Quick Evaluation

## 2020-11-08 NOTE — Discharge Instructions (Signed)
Botulinum Toxin Bladder Injection  A botulinum toxin bladder injection is a procedure to treat an overactive bladder. During the procedure, a drug called botulinum toxin is injected into the bladder through a long, thin needle. This drug relaxes the bladder muscles and reduces overactivity. You may need this procedure if your medicines are not working or you cannot take them. The procedure may be repeated as needed. The treatment usually lasts for 6 months. Your health care provider will monitor you to see how well you respond. Tell a health care provider about:  Any allergies you have.  All medicines you are taking, including vitamins, herbs, eye drops, creams, and over-the-counter medicines.  Any problems you or family members have had with anesthetic medicines.  Any blood disorders you have.  Any surgeries you have had.  Any medical conditions you have.  Any previous reactions to a botulinum toxin injection.  Any symptoms of urinary tract infection. These include chills, fever, a burning feeling when passing urine, and needing to pass urine often.  Whether you are pregnant or may be pregnant. What are the risks? Generally this is a safe procedure. However, problems may occur, including:  Not being able to pass urine. If this happens, you may need to have your bladder emptied with a thin tube inserted into your urethra (urinary catheter).  Bleeding.  Urinary tract infection.  Allergic reaction to the botulinum toxin.  Pain or burning when passing urine.  Damage to other structures or organs. What happens before the procedure? Staying hydrated Follow instructions from your health care provider about hydration, which may include:  Up to 2 hours before the procedure - you may continue to drink clear liquids, such as water, clear fruit juice, black coffee, and plain tea. Eating and drinking restrictions Follow instructions from your health care provider about eating and  drinking, which may include:  8 hours before the procedure - stop eating heavy meals or foods, such as meat, fried foods, or fatty foods.  6 hours before the procedure - stop eating light meals or foods, such as toast or cereal.  6 hours before the procedure - stop drinking milk or drinks that contain milk.  2 hours before the procedure - stop drinking clear liquids. Medicines Ask your health care provider about:  Changing or stopping your regular medicines. This is especially important if you are taking diabetes medicines or blood thinners.  Taking medicines such as aspirin and ibuprofen. These medicines can thin your blood. Do not take these medicines unless your health care provider tells you to take them.  Taking over-the-counter medicines, vitamins, herbs, and supplements. General instructions  Plan to have someone take you home from the hospital or clinic.  If you will be going home right after the procedure, plan to have someone with you for 24 hours.  Ask your health care provider what steps will be taken to help prevent infection. These may include: ? Removing hair at the procedure site. ? Washing skin with a germ-killing soap. ? Antibiotic medicine. What happens during the procedure?   You will be asked to empty your bladder.  An IV will be inserted into one of your veins.  You will be given one or more of the following: ? A medicine to help you relax (sedative). ? A medicine to numb the area (local anesthetic). ? A medicine to make you fall asleep (general anesthetic).  A long, thin scope called a cystoscope will be passed into your bladder through the part  of the body that carries urine from your bladder (urethra).  The cystoscope will be used to fill your bladder with water.  A long needle will be passed through the cystoscope and into the bladder.  The botulinum toxin will be injected into your bladder. It may be injected into multiple areas of your  bladder.  Your bladder will be emptied, and the cystoscope will be removed. The procedure may vary among health care providers and hospitals. What can I expect after procedure? After your procedure, it is common to have:  Blood-tinged urine.  Burning or soreness when you pass urine. Follow these instructions at home: Medicines  Take over-the-counter and prescription medicines only as told by your health care provider.  If you were prescribed an antibiotic medicine, take it as told by your health care provider. Do not stop taking the antibiotic even if you start to feel better. General instructions   Do not drive for 24 hours if you were given a sedative during your procedure.  Drink enough fluid to keep your urine pale yellow.  Return to your normal activities as told by your health care provider. Ask your health care provider what activities are safe for you.  Keep all follow-up visits as told by your health care provider. This is important. Contact a health care provider if you have:  A fever or chills.  Blood-tinged urine for more than one day after your procedure.  Worsening pain or burning when you pass urine.  Pain or burning when passing urine for more than two days after your procedure.  Trouble emptying your bladder. Get help right away if you:  Have bright red blood in your urine.  Are unable to pass urine. Summary  A botulinum toxin bladder injection is a procedure to treat an overactive bladder.  This is generally a very safe procedure. However, problems may occur, including not being able to pass urine, bleeding, infection, pain, and allergic reactions to medicines.  You will be told when to stop eating and drinking, and what medicines to change or stop. Follow instructions carefully.  After the procedure, it is common to have blood in urine and to have soreness or burning when passing urine.  Contact a health care provider if you have a fever, have  blood in urine for more than a few days, or have trouble passing urine. Get help right away if you have bright red blood in the urine, or if you are unable to pass urine. This information is not intended to replace advice given to you by your health care provider. Make sure you discuss any questions you have with your health care provider. Document Revised: 06/14/2018 Document Reviewed: 06/14/2018 Elsevier Patient Education  2020 [place]   1) The drugs that you were given will stay in your system until tomorrow so for the next 24 hours you should not:  A) Drive an automobile B) Make any legal decisions C) Drink any alcoholic beverage   2) You may resume regular meals tomorrow.  Today it is better to start with liquids and gradually work up to solid foods.  You may eat anything you prefer, but it is better to start with liquids, then soup and crackers, and gradually work up to solid foods.   3) Please notify your doctor immediately if you have any unusual bleeding, trouble breathing, redness and pain at the surgery site, drainage, fever, or pain not relieved by medication.  4) Additional Instructions:        Please contact your physician with any problems or Same Day Surgery at [phone], Monday through Friday 6 am to 4 pm, or [place] at [facility] number at [phone].
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
## 2020-11-08 NOTE — Op Note (Signed)
Date of procedure: 11/08/20  Preoperative diagnosis:  1. Refractory OAB  Postoperative diagnosis:  1. Same as above  Procedure: 1. Cystoscopy 2. Intravesical injection of botulinum toxin  Surgeon: Hollice Espy, MD  Anesthesia: General  Complications: None  Intraoperative findings: Uncomplicated procedure  EBL: Minimal  Specimens: None  Drains: None  Indication: Stacy Mercado is a 74 y.o. patient with refractory OAB.  After reviewing the management options for treatment, the patient elected to proceed with the above surgical procedure(s). We have discussed the potential benefits and risks of the procedure, side effects of the proposed treatment, the likelihood of the patient achieving the goals of the procedure, and any potential problems that might occur during the procedure or recuperation. Informed consent has been obtained.  Description of procedure:  The patient was taken to the operating room and general anesthesia was induced.  The patient was placed in the dorsal lithotomy position, prepped and draped in the usual sterile fashion, and preoperative antibiotics were administered. A preoperative time-out was performed.   A 21 French rigid cystoscope was advanced per urethra into the bladder.  The bladder was carefully inspected and unremarkable other than severe decent (cystocele). At this point time, a total of 100 units of botulinum toxin was injected into the muscularis propria of the bladder in a total of 20 0.5 cc injections in rows across the posterior bladder wall.  The procedure was uncomplicated.  Hemostasis was excellent.  The bladder was drained and the scope was removed.  The patient was then clean and dry, repositioned the supine position, reversed by anesthesia, and taken to the PACU in stable condition  Plan: Patient will follow up in a few weeks.  They have been taught how to do catheterize themselves intermittently as needed if they are unable to void and  will contact us in this situation for guidance.    Hollice Espy, M.D.

## 2020-11-08 NOTE — Anesthesia Postprocedure Evaluation (Signed)
Anesthesia Post Note  Patient: Stacy Mercado  Procedure(s) Performed: BOTOX INJECTION (N/A )  Patient location during evaluation: PACU Anesthesia Type: General Level of consciousness: awake and alert and oriented Pain management: pain level controlled Vital Signs Assessment: post-procedure vital signs reviewed and stable Respiratory status: spontaneous breathing, nonlabored ventilation and respiratory function stable Cardiovascular status: blood pressure returned to baseline and stable Postop Assessment: no signs of nausea or vomiting Anesthetic complications: no   No complications documented.   Last Vitals:  Vitals:   11/08/20 0822 11/08/20 0830  BP:    Pulse: 60 60  Resp: (!) 9 12  Temp:    SpO2: 95% 93%    Last Pain:  Vitals:   11/08/20 0830  TempSrc:   PainSc: 0-No pain                 Vietta Bonifield

## 2020-11-22 ENCOUNTER — Telehealth: Payer: Self-pay

## 2020-11-22 NOTE — Telephone Encounter (Signed)
Repatha  PA continuation for 2022 submitted via cover my meds.

## 2020-11-24 ENCOUNTER — Ambulatory Visit
Admission: RE | Admit: 2020-11-24 | Discharge: 2020-11-24 | Disposition: A | Discharge status: Home / Self Care | Payer: Medicare Other | Source: Ambulatory Visit | Attending: Radiology | Admitting: Radiology

## 2020-11-24 ENCOUNTER — Other Ambulatory Visit: Payer: Self-pay

## 2020-11-24 DIAGNOSIS — C641 Malignant neoplasm of right kidney, except renal pelvis: Secondary | ICD-10-CM

## 2020-11-30 ENCOUNTER — Encounter: Payer: Self-pay | Admitting: Family Medicine

## 2020-11-30 ENCOUNTER — Other Ambulatory Visit: Payer: Self-pay | Admitting: Family Medicine

## 2020-12-01 ENCOUNTER — Encounter: Payer: Self-pay | Admitting: Family Medicine

## 2020-12-01 ENCOUNTER — Ambulatory Visit: Payer: Medicare Other | Admitting: Urology

## 2020-12-02 ENCOUNTER — Ambulatory Visit: Payer: Self-pay

## 2020-12-02 ENCOUNTER — Encounter: Payer: Self-pay | Admitting: *Deleted

## 2020-12-02 ENCOUNTER — Other Ambulatory Visit: Payer: Self-pay

## 2020-12-02 ENCOUNTER — Ambulatory Visit
Admission: RE | Admit: 2020-12-02 | Discharge: 2020-12-02 | Disposition: A | Discharge status: Home / Self Care | Payer: Medicare Other | Source: Ambulatory Visit | Attending: Radiology | Admitting: Radiology

## 2020-12-02 DIAGNOSIS — H8309 Labyrinthitis, unspecified ear: Secondary | ICD-10-CM | POA: Diagnosis not present

## 2020-12-02 DIAGNOSIS — J019 Acute sinusitis, unspecified: Secondary | ICD-10-CM | POA: Diagnosis not present

## 2020-12-02 DIAGNOSIS — Z9889 Other specified postprocedural states: Secondary | ICD-10-CM | POA: Diagnosis not present

## 2020-12-02 DIAGNOSIS — B9689 Other specified bacterial agents as the cause of diseases classified elsewhere: Secondary | ICD-10-CM | POA: Diagnosis not present

## 2020-12-02 DIAGNOSIS — H66001 Acute suppurative otitis media without spontaneous rupture of ear drum, right ear: Secondary | ICD-10-CM | POA: Diagnosis not present

## 2020-12-02 DIAGNOSIS — H6021 Malignant otitis externa, right ear: Secondary | ICD-10-CM | POA: Diagnosis not present

## 2020-12-02 DIAGNOSIS — C641 Malignant neoplasm of right kidney, except renal pelvis: Secondary | ICD-10-CM | POA: Diagnosis not present

## 2020-12-02 HISTORY — PX: IR RADIOLOGIST EVAL & MGMT: IMG5224

## 2020-12-02 NOTE — Telephone Encounter (Signed)
Noted, will follow-up after UC or if we can fit her in before she attends UC.

## 2020-12-02 NOTE — Telephone Encounter (Signed)
Pt. Having right ear pain and dizziness x 2 days. No fever or other symptoms. No availability in the practice today per [place]. Pt. May go to UC.  Reason for Disposition  [1] MODERATE dizziness (e.g., interferes with normal activities) AND [2] has NOT been evaluated by physician for this  (Exception: dizziness caused by heat exposure, sudden standing, or poor fluid intake)  Answer Assessment - Initial Assessment Questions 1. DESCRIPTION: "Describe your dizziness."     Dizzy 2. LIGHTHEADED: "Do you feel lightheaded?" (e.g., somewhat faint, woozy, weak upon standing)     Yes 3. VERTIGO: "Do you feel like either you or the room is spinning or tilting?" (i.e. vertigo)     No 4. SEVERITY: "How bad is it?"  "Do you feel like you are going to faint?" "Can you stand and walk?"   - MILD: Feels slightly dizzy, but walking normally.   - MODERATE: Feels very unsteady when walking, but not falling; interferes with normal activities (e.g., school, work) .   - SEVERE: Unable to walk without falling, or requires assistance to walk without falling; feels like passing out now.      Moderate 5. ONSET:  "When did the dizziness begin?"     2 days 6. AGGRAVATING FACTORS: "Does anything make it worse?" (e.g., standing, change in head position)     No 7. HEART RATE: "Can you tell me your heart rate?" "How many beats in 15 seconds?"  (Note: not all patients can do this)       No 8. CAUSE: "What do you think is causing the dizziness?"     Unsure 9. RECURRENT SYMPTOM: "Have you had dizziness before?" If Yes, ask: "When was the last time?" "What happened that time?"     No 10. OTHER SYMPTOMS: "Do you have any other symptoms?" (e.g., fever, chest pain, vomiting, diarrhea, bleeding)       Right ear pain 11. PREGNANCY: "Is there any chance you are pregnant?" "When was your last menstrual period?"       No  Protocols used: DIZZINESS [facility]

## 2020-12-02 NOTE — Progress Notes (Signed)
Patient ID: Stacy Mercado, female   DOB: 10-Sep-1946, 74 y.o.   MRN: [id number]         Chief Complaint: Right renal mass, post right sided renal biopsy and cryoablation (10/27/20)  Referring Physician(s): Brandon,Ashley  History of Present Illness:  Stacy Mercado a 74 y.o.femalewith past medical history significant for diabetes, hypertension, hyperlipidemia and bladder cancer who underwent technically successful right-sided renal cryoablation and biopsy on 10/27/2020 with biopsy compatible with oncocytoma.  Patient is seen today in telemedicine consultation for postprocedural evaluation and management.  Patient states that he experienced approximately 2 to 3 weeks of fatigue following the cryoablation however was otherwise without complaint.  Specifically, she denied postprocedural flank pain or discomfort.  She denies hematuria.  No fever or chills.   Past Medical History:  Diagnosis Date   Arthritis    Bladder cancer ([place]) 2017   Chronic low back pain    Depression    Diabetes mellitus without complication ([place])    History of blood transfusion    History of iron deficiency anemia    History of pleurisy    History of pneumonia    Hyperlipidemia 08/07/2016   Hypertension    Hypothyroidism    Migraines    Personal history of chemotherapy    Bladder Ca   Urge incontinence of urine 08/07/2016    Past Surgical History:  Procedure Laterality Date   ABDOMINAL HYSTERECTOMY     APPENDECTOMY  1961   bladder cancer removed  2014   BOTOX INJECTION N/A 11/08/2020   Procedure: BOTOX INJECTION;  Surgeon: Hollice Espy, MD;  Location: [facility];  Service: Urology;  Laterality: N/A;   CATARACT EXTRACTION W/ INTRAOCULAR LENS  IMPLANT, BILATERAL     complete hysterectomy  1999   DILATION AND CURETTAGE OF UTERUS  1999   IR RADIOLOGIST EVAL & MGMT  07/20/2020   IR RADIOLOGIST EVAL & MGMT  10/19/2020   L rotator cuff Left 2010   R thumb ruptured  ligament and tendon  2012   RADIOLOGY WITH ANESTHESIA Right 10/27/2020   Procedure: IR WITH ANESTHESIA RIGHT RENAL CRYOABLATION;  Surgeon: Sandi Mariscal, MD;  Location: [facility];  Service: Anesthesiology;  Laterality: Right;   REVERSE SHOULDER ARTHROPLASTY Right 11/20/2017   Procedure: REVERSE SHOULDER ARTHROPLASTY;  Surgeon: Corky Mull, MD;  Location: [facility];  Service: Orthopedics;  Laterality: Right;   SHOULDER ARTHROSCOPY WITH BICEPSTENOTOMY Right 01/30/2017   Procedure: SHOULDER ARTHROSCOPY WITH BICEPSTENOTOMY;  Surgeon: Corky Mull, MD;  Location: [facility];  Service: Orthopedics;  Laterality: Right;  biceps tenolysis   SHOULDER ARTHROSCOPY WITH OPEN ROTATOR CUFF REPAIR Right 01/30/2017   Procedure: SHOULDER ARTHROSCOPY WITH OPEN ROTATOR CUFF REPAIR;  Surgeon: Corky Mull, MD;  Location: [facility];  Service: Orthopedics;  Laterality: Right;  Massive rotator cuff tear repair    Allergies: Statins  Medications: Prior to Admission medications   Medication Sig Mercado Date End Date Taking? Authorizing Provider  aspirin 81 MG chewable tablet Chew 81 mg by mouth daily.     [provider]  baclofen (LIORESAL) 10 MG tablet Take 1 tablet (10 mg total) by mouth at bedtime. Patient taking differently: Take 10 mg by mouth at bedtime as needed for muscle spasms.  01/02/20   Johnson, Megan P, DO  escitalopram (LEXAPRO) 10 MG tablet TAKE 1 TABLET(10 MG) BY MOUTH DAILY Patient taking differently: Take 10 mg by mouth every morning.  07/06/20   Johnson, Megan P, DO  Evolocumab (REPATHA SURECLICK) 829  MG/ML SOAJ Inject 140 mg into the skin every 14 (fourteen) days. 07/06/20   Johnson, Megan P, DO  gabapentin (NEURONTIN) 300 MG capsule Take 1 capsule (300 mg total) by mouth at bedtime. 07/06/20   Johnson, Megan P, DO  HYDROcodone-acetaminophen (NORCO/VICODIN) 5-325 MG tablet Take 1-2 tablets by mouth every 6 (six) hours as needed for moderate pain. 10/28/20   Allred, Shirlyn Goltz, PA-C  levothyroxine  (SYNTHROID) 50 MCG tablet TAKE 1 TABLET BY MOUTH DAILY BEFORE BREAKFAST Patient taking differently: Take 50 mcg by mouth daily before breakfast.  05/24/20   Johnson, Megan P, DO  Lidocaine-Menthol (ICY HOT LIDOCAINE PLUS MENTHOL EX) Apply 1 application topically 2 (two) times daily as needed (muscle pain).  Patient not taking: Reported on 10/21/2020    [provider]  losartan (COZAAR) 50 MG tablet Take 1.5 tablets (75 mg total) by mouth daily. Patient taking differently: Take 50 mg by mouth every morning.  07/06/20   Park Liter P, DO  metFORMIN (GLUCOPHAGE-XR) 500 MG 24 hr tablet Take 2 tablets (1,000 mg total) by mouth in the morning and at bedtime. 07/06/20   Johnson, Megan P, DO  mirabegron ER ([name]) 50 MG TB24 tablet Take 50 mg by mouth every evening.     [provider]  montelukast (SINGULAIR) 10 MG tablet Take 1 tablet (10 mg total) by mouth at bedtime. Patient taking differently: Take 10 mg by mouth at bedtime as needed (allergies).  05/03/20   Park Liter P, DO  Multiple Vitamin (MULTIVITAMIN WITH MINERALS) TABS tablet Take 1 tablet by mouth daily.    [provider]  naproxen (NAPROSYN) 500 MG tablet Take 1 tablet (500 mg total) by mouth 2 (two) times daily with a meal. 07/06/20   Johnson, Megan P, DO  nitrofurantoin, macrocrystal-monohydrate, (MACROBID) 100 MG capsule Take 1 capsule (100 mg total) by mouth 2 (two) times daily. 11/05/20   Hollice Espy, MD     Family History  Problem Relation Age of Onset   Hypertension Brother    Diabetes Brother    Healthy Son    Healthy Son    Healthy Daughter    Kidney cancer Neg Hx    Bladder Cancer Neg Hx    Breast cancer Neg Hx     Social History   Socioeconomic History   Marital status: Divorced    Spouse name: Not on file   Number of children: Not on file   Years of education: Not on file   Highest education level: Not on file  Occupational History   Not on file  Tobacco Use    Smoking status: Former Smoker    Years: 50.00    Types: Cigarettes    Quit date: 12/18/2006    Years since quitting: 13.9   Smokeless tobacco: Never Used  Vaping Use   Vaping Use: Never used  Substance and Sexual Activity   Alcohol use: Yes    Alcohol/week: 7.0 standard drinks    Types: 7 Shots of liquor per week    Comment: glass of liquor nightly   Drug use: No   Sexual activity: Not on file  Other Topics Concern   Not on file  Social History Narrative   Not on file   Social Determinants of Health   Financial Resource Strain: Medium Risk   Difficulty of Paying Living Expenses: Somewhat hard  Food Insecurity: Not on file  Transportation Needs: Not on file  Physical Activity: Not on file  Stress: Not on file  Social Connections: Not on file    ECOG Status: 0 - Asymptomatic  Review of Systems  Review of Systems: A 12 point ROS discussed and pertinent positives are indicated in the HPI above.  All other systems are negative.  Physical Exam No direct physical exam was performed (except for noted visual exam findings with Video Visits).   Vital Signs: There were no vitals taken for this visit.  Imaging:  Selected images from renal protocol CT scan performed 10/12/2020 as well as intraprocedural images during right-sided renal cryoablation and biopsy performed 10/27/2020 were reviewed in detail.   Labs:  CBC: Recent Labs    01/02/20 0929 10/25/20 1323 10/27/20 0750  WBC 7.3 5.9 6.6  HGB 12.9 12.5 13.3  HCT 39.4 39.2 41.6  PLT 174 180 186    COAGS: Recent Labs    10/27/20 0750  INR 0.9    BMP: Recent Labs    01/02/20 0929 05/03/20 1529 07/06/20 1143 10/12/20 1443 10/25/20 1323 10/27/20 0750  NA 141 141 142  --  141 140  K 4.1 4.7 4.4  --  4.2 4.5  CL 104 101 103  --  105 103  CO2 25 26 26   --  29 27  GLUCOSE 182* 144* 222*  --  154* 149*  BUN 17 20 16   --  20 19  CALCIUM 10.3 10.4* 10.3  --  10.1 10.2  CREATININE 0.68 0.73 0.75  0.70 0.78 0.69  GFRNONAA 86 81 78  --  >60 >60  GFRAA 99 93 90  --   --   --     LIVER FUNCTION TESTS: Recent Labs    01/02/20 0929 05/03/20 1529  BILITOT 0.4 0.6  AST 20 19  ALT 29 28  ALKPHOS 93 86  PROT 6.0 6.7  ALBUMIN 3.9 4.0    TUMOR MARKERS:   Assessment and Plan:  Stacy Padovano Ackermanis a 74 y.o.femalewith past medical history significant for diabetes, hypertension, hyperlipidemia and bladder cancer who underwent technically successful right-sided renal cryoablation and biopsy on 10/27/2020 with biopsy compatible with oncocytoma.  Patient is seen today in telemedicine consultation for postprocedural evaluation and management.  Patient states that he experienced approximately 2 to 3 weeks of fatigue following the cryoablation however was otherwise without complaint.    Selected images from renal protocol CT scan performed 10/12/2020 as well as intraprocedural images during right-sided renal cryoablation and biopsy performed 10/27/2020 were reviewed in detail.  Intraprocedural images demonstrate the ice ball encompassing the entirety of the expected location of the partially exophytic right-sided renal lesion (series 14).  I explained that while oncocytoma is considered a benign renal lesion, there is a likelihood of a sampling error and as such we will maintain our diligence with surveillance imaging until we document 3 years of stability.  As such, we will obtain our initial postprocedural surveillance imaging 3 months following the ablation (mid February 2022).  As the imaging performed prior the cryoablation was a dedicated renal protocol CT scan, we will first pursue renal protocol CT scan though ultimately may convert to MRI either to limit radiation exposure and/or for problem-solving purposes.  Patient demonstrated excellent understanding and is in agreement with the above plan of care.  Plan: - Postprocedural renal protocol CT scan performed 3 months following the  cryoablation (mid February 2022).  A copy of this report was sent to the requesting provider on this date.  Electronically Signed: Sandi Mariscal 12/02/2020, 1:03 PM   I spent a total  of 10 Minutes in remote  clinical consultation, greater than 50% of which was counseling/coordinating care for post right-sided renal cryoablation and biopsy.    Visit type: Audio only (telephone). Audio (no video) only due to patient's lack of internet/smartphone capability. Alternative for in-person consultation at [facility], [address] [place], [place], [place]. This visit type was conducted due to national recommendations for restrictions regarding the COVID-19 Pandemic (e.g. social distancing).  This format is felt to be most appropriate for this patient at this time.  All issues noted in this document were discussed and addressed.

## 2020-12-03 ENCOUNTER — Ambulatory Visit (INDEPENDENT_AMBULATORY_CARE_PROVIDER_SITE_OTHER): Payer: Medicare Other | Admitting: Physician Assistant

## 2020-12-03 ENCOUNTER — Encounter: Payer: Self-pay | Admitting: Physician Assistant

## 2020-12-03 ENCOUNTER — Other Ambulatory Visit: Payer: Self-pay

## 2020-12-03 VITALS — BP 132/76 | HR 73 | Ht 65.0 in | Wt 189.0 lb

## 2020-12-03 DIAGNOSIS — N3281 Overactive bladder: Secondary | ICD-10-CM

## 2020-12-03 DIAGNOSIS — N898 Other specified noninflammatory disorders of vagina: Secondary | ICD-10-CM | POA: Diagnosis not present

## 2020-12-03 LAB — MICROSCOPIC EXAMINATION

## 2020-12-03 LAB — URINALYSIS, COMPLETE
Bilirubin, UA: NEGATIVE
Glucose, UA: NEGATIVE
Ketones, UA: NEGATIVE
Leukocytes,UA: NEGATIVE
Nitrite, UA: NEGATIVE
Protein,UA: NEGATIVE
RBC, UA: NEGATIVE
Specific Gravity, UA: 1.025 (ref 1.005–1.030)
Urobilinogen, Ur: 0.2 mg/dL (ref 0.2–1.0)
pH, UA: 6 (ref 5.0–7.5)

## 2020-12-03 LAB — BLADDER SCAN AMB NON-IMAGING

## 2020-12-03 MED ORDER — MIRABEGRON ER 25 MG PO TB24: 25.0000 mg | ORAL_TABLET | Freq: Every day | ORAL | 0 refills | Status: DC

## 2020-12-03 NOTE — Progress Notes (Signed)
12/03/2020 3:12 PM   Stacy Mercado 09-16-1946 [id number]  CC: Chief Complaint  Patient presents with  . Routine Post Op    HPI: Stacy Mercado is a 74 y.o. female with PMH bladder cancer s/p BCG, right renal mass s/p cryoablation, and refractory OAB now s/p intravesical Botox injections with Dr. Erlene Quan on 11/08/2020 who presents today for symptom recheck and PVR.  Today she reports significant improvement in her urinary symptoms after undergoing intravesical Botox.  She states she initially had complete resolution of nocturia immediately following injections, however it has started to increase, now x2, and she is concerned that it may continue to become more frequent (baseline every 30 minutes).  Overall, she reports decreased urinary urgency, better urinary control with decrease leakage, and she is using only 1 absorbent pad nightly, previously 2-3 pads per night.  Patient is very pleased with her progress on intravesical Botox but wonders if she should consider combining this with [name] given her increased nocturia since injections.  Additionally, patient reports a 1 week history of vaginal odor.  She is unable to further characterize the odor.  She denies vaginal bleeding, discharge, or pruritus.  In-office UA and microscopy today pan negative. PVR 68mL.  PMH: Past Medical History:  Diagnosis Date  . Arthritis   . Bladder cancer ([place]) 2017  . Chronic low back pain   . Depression   . Diabetes mellitus without complication ([place])   . History of blood transfusion   . History of iron deficiency anemia   . History of pleurisy   . History of pneumonia   . Hyperlipidemia 08/07/2016  . Hypertension   . Hypothyroidism   . Migraines   . Personal history of chemotherapy    Bladder Ca  . Urge incontinence of urine 08/07/2016    Surgical History: Past Surgical History:  Procedure Laterality Date  . ABDOMINAL HYSTERECTOMY    . APPENDECTOMY  1961  . bladder cancer  removed  2014  . BOTOX INJECTION N/A 11/08/2020   Procedure: BOTOX INJECTION;  Surgeon: Hollice Espy, MD;  Location: [facility];  Service: Urology;  Laterality: N/A;  . CATARACT EXTRACTION W/ INTRAOCULAR LENS  IMPLANT, BILATERAL    . complete hysterectomy  1999  . DILATION AND CURETTAGE OF UTERUS  1999  . IR RADIOLOGIST EVAL & MGMT  07/20/2020  . IR RADIOLOGIST EVAL & MGMT  10/19/2020  . IR RADIOLOGIST EVAL & MGMT  12/02/2020  . L rotator cuff Left 2010  . R thumb ruptured ligament and tendon  2012  . RADIOLOGY WITH ANESTHESIA Right 10/27/2020   Procedure: IR WITH ANESTHESIA RIGHT RENAL CRYOABLATION;  Surgeon: Sandi Mariscal, MD;  Location: [facility];  Service: Anesthesiology;  Laterality: Right;  . REVERSE SHOULDER ARTHROPLASTY Right 11/20/2017   Procedure: REVERSE SHOULDER ARTHROPLASTY;  Surgeon: Corky Mull, MD;  Location: [facility];  Service: Orthopedics;  Laterality: Right;  . SHOULDER ARTHROSCOPY WITH BICEPSTENOTOMY Right 01/30/2017   Procedure: SHOULDER ARTHROSCOPY WITH BICEPSTENOTOMY;  Surgeon: Corky Mull, MD;  Location: [facility];  Service: Orthopedics;  Laterality: Right;  biceps tenolysis  . SHOULDER ARTHROSCOPY WITH OPEN ROTATOR CUFF REPAIR Right 01/30/2017   Procedure: SHOULDER ARTHROSCOPY WITH OPEN ROTATOR CUFF REPAIR;  Surgeon: Corky Mull, MD;  Location: [facility];  Service: Orthopedics;  Laterality: Right;  Massive rotator cuff tear repair    Home Medications:  Allergies as of 12/03/2020      Reactions   Statins Other (See Comments)   myalgia  Medication List       Accurate as of December 03, 2020  3:12 PM. If you have any questions, ask your nurse or doctor.        aspirin 81 MG chewable tablet Chew 81 mg by mouth daily.   baclofen 10 MG tablet Commonly known as: LIORESAL Take 1 tablet (10 mg total) by mouth at bedtime. What changed:   when to take this  reasons to take this   cefdinir 300 MG capsule Commonly known as: OMNICEF Take 300 mg by mouth 2 (two)  times daily.   escitalopram 10 MG tablet Commonly known as: LEXAPRO TAKE 1 TABLET(10 MG) BY MOUTH DAILY What changed:   how much to take  how to take this  when to take this  additional instructions   gabapentin 300 MG capsule Commonly known as: NEURONTIN Take 1 capsule (300 mg total) by mouth at bedtime.   HYDROcodone-acetaminophen 5-325 MG tablet Commonly known as: NORCO/VICODIN Take 1-2 tablets by mouth every 6 (six) hours as needed for moderate pain.   ICY HOT LIDOCAINE PLUS MENTHOL EX Apply 1 application topically 2 (two) times daily as needed (muscle pain).   levothyroxine 50 MCG tablet Commonly known as: SYNTHROID TAKE 1 TABLET BY MOUTH DAILY BEFORE BREAKFAST What changed: See the new instructions.   losartan 50 MG tablet Commonly known as: COZAAR Take 1.5 tablets (75 mg total) by mouth daily. What changed:   how much to take  when to take this   meclizine 12.5 MG tablet Commonly known as: ANTIVERT Take by mouth.   metFORMIN 500 MG 24 hr tablet Commonly known as: GLUCOPHAGE-XR Take 2 tablets (1,000 mg total) by mouth in the morning and at bedtime.   mirabegron ER 50 MG Tb24 tablet Commonly known as: [name] Take 50 mg by mouth every evening.   montelukast 10 MG tablet Commonly known as: SINGULAIR Take 1 tablet (10 mg total) by mouth at bedtime. What changed:   when to take this  reasons to take this   multivitamin with minerals Tabs tablet Take 1 tablet by mouth daily.   naproxen 500 MG tablet Commonly known as: NAPROSYN Take 1 tablet (500 mg total) by mouth 2 (two) times daily with a meal.   neomycin-polymyxin-hydrocortisone 3.5-10000-1 OTIC suspension Commonly known as: CORTISPORIN SMARTSIG:In Ear(s)   nitrofurantoin (macrocrystal-monohydrate) 100 MG capsule Commonly known as: Macrobid Take 1 capsule (100 mg total) by mouth 2 (two) times daily.   Repatha SureClick 161 MG/ML Soaj Generic drug: Evolocumab Inject 140 mg into the  skin every 14 (fourteen) days.       Allergies:  Allergies  Allergen Reactions  . Statins Other (See Comments)    myalgia    Family History: Family History  Problem Relation Age of Onset  . Hypertension Brother   . Diabetes Brother   . Healthy Son   . Healthy Son   . Healthy Daughter   . Kidney cancer Neg Hx   . Bladder Cancer Neg Hx   . Breast cancer Neg Hx     Social History:   reports that she quit smoking about 13 years ago. Her smoking use included cigarettes. She quit after 50.00 years of use. She has never used smokeless tobacco. She reports current alcohol use of about 7.0 standard drinks of alcohol per week. She reports that she does not use drugs.  Physical Exam: BP 132/76 (BP Location: Left Arm, Patient Position: Sitting, Cuff Size: Normal)   Pulse 73   Ht  5\' 5"  (1.651 m)   Wt 189 lb (85.7 kg)   BMI 31.45 kg/m   Constitutional:  Alert and oriented, no acute distress, nontoxic appearing HEENT: [place], AT Cardiovascular: No clubbing, cyanosis, or edema Respiratory: Normal respiratory effort, no increased work of breathing Skin: No rashes, bruises or suspicious lesions Neurologic: Grossly intact, no focal deficits, moving all 4 extremities Psychiatric: Normal mood and affect  Laboratory Data: Results for orders placed or performed in visit on 12/03/20  Microscopic Examination   Urine  Result Value Ref Range   WBC, UA 0-5 0 - 5 /hpf   RBC 0-2 0 - 2 /hpf   Epithelial Cells (non renal) 0-10 0 - 10 /hpf   Bacteria, UA Few (A) None seen/Few  Urinalysis, Complete  Result Value Ref Range   Specific Gravity, UA 1.025 1.005 - 1.030   pH, UA 6.0 5.0 - 7.5   Color, UA Yellow Yellow   Appearance Ur Clear Clear   Leukocytes,UA Negative Negative   Protein,UA Negative Negative/Trace   Glucose, UA Negative Negative   Ketones, UA Negative Negative   RBC, UA Negative Negative   Bilirubin, UA Negative Negative   Urobilinogen, Ur 0.2 0.2 - 1.0 mg/dL   Nitrite, UA  Negative Negative   Microscopic Examination See below:   Bladder Scan (Post Void Residual) in office  Result Value Ref Range   Scan Result 36mL    Assessment & Plan:   1. OAB (overactive bladder) Significantly improved following intravesical Botox injections, UA benign today.  I explained that the goals of treatment for OAB are not to "turn off" the symptoms of OAB, but rather to decrease their severity and thus increase their tolerability.  Okay to try combination therapy with [name], however recommend starting at 25 mg daily with plans for symptom recheck and PVR in 4 weeks.  Patient is in agreement with this plan. - Urinalysis, Complete - Bladder Scan (Post Void Residual) in office - mirabegron ER ([name]) 25 MG TB24 tablet; Take 1 tablet (25 mg total) by mouth daily.  Dispense: 28 tablet; Refill: 0  2. Vaginal odor Possibly consistent with bacterial vaginosis, obtained a self swab vaginal sample in clinic today and will send for further testing.  We will treat as indicated. - NuSwab VG+, Candida 6sp   Return in about 4 weeks (around 12/31/2020) for Symptom recheck with PVR.  Debroah Loop, PA-C  [facility] [address], [place], [place] [address] [phone]

## 2020-12-08 LAB — NUSWAB VG+, CANDIDA 6SP
C PARAPSILOSIS/TROPICALIS: NEGATIVE
Candida albicans, NAA: NEGATIVE
Candida glabrata, NAA: POSITIVE — AB
Candida krusei, NAA: NEGATIVE
Candida lusitaniae, NAA: NEGATIVE
Chlamydia trachomatis, NAA: NEGATIVE
Neisseria gonorrhoeae, NAA: NEGATIVE
Trich vag by NAA: NEGATIVE

## 2020-12-27 ENCOUNTER — Ambulatory Visit: Payer: Medicare Other | Admitting: Family Medicine

## 2020-12-28 ENCOUNTER — Encounter: Payer: Self-pay | Admitting: Family Medicine

## 2020-12-28 ENCOUNTER — Other Ambulatory Visit: Payer: Self-pay

## 2020-12-28 ENCOUNTER — Ambulatory Visit (INDEPENDENT_AMBULATORY_CARE_PROVIDER_SITE_OTHER): Payer: Medicare Other | Admitting: Family Medicine

## 2020-12-28 VITALS — BP 96/58 | HR 76 | Temp 98.4°F | Ht 65.0 in | Wt 185.2 lb

## 2020-12-28 DIAGNOSIS — R42 Dizziness and giddiness: Secondary | ICD-10-CM | POA: Diagnosis not present

## 2020-12-28 LAB — GLUCOSE HEMOCUE WAIVED: Glu Hemocue Waived: 125 mg/dL — ABNORMAL HIGH (ref 65–99)

## 2020-12-28 MED ORDER — LOSARTAN POTASSIUM 50 MG PO TABS
25.0000 mg | ORAL_TABLET | Freq: Every day | ORAL | 1 refills | Status: DC
Start: 2020-12-28 — End: 2021-01-11

## 2020-12-28 NOTE — Patient Instructions (Signed)
It was great to see you!  Our plans for today:  - Take one half tablet of your losartan (25mg ).  - Come back in 2 weeks for recheck.   Take care and seek immediate care sooner if you develop any concerns.   Dr. Ky Barban

## 2020-12-28 NOTE — Assessment & Plan Note (Signed)
Persistent after treatment of AOM, refractory to meclizine. CBG wnl. Did have reproduction of sx with movement, neuro exam otherwise wnl.  Orthostatics negative today but with low BP and aggravated by movement, likely contributing. Will decrease losartan to 25mg  and f/u in 2 weeks.

## 2020-12-28 NOTE — Progress Notes (Signed)
    SUBJECTIVE:   CHIEF COMPLAINT / HPI:   Patient Active Problem List   Diagnosis Date Noted  . Dizziness 12/28/2020  . 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/22/2020  . Chronic bilateral low back pain with left-sided sciatica 07/13/2020  . Lichen sclerosus 96/03/5408  . Vulvar irritation 04/09/2019  . Sciatica of left side 09/17/2018  . Osteopenia 06/14/2018  . Hypothyroidism 04/26/2018  . 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  . Complex tear of lateral meniscus of right knee as current injury 08/03/2017  . Primary osteoarthritis of right knee 08/03/2017  . Advance directive discussed with patient 03/13/2017  . Cataracts, bilateral 08/14/2016  . Rotator cuff tendinitis, right 08/14/2016  . Right kidney mass 08/07/2016  . Hyperlipidemia 08/07/2016  . Urge incontinence of urine 08/07/2016  . 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 Bladder cancer ([place])   . Controlled type 2 diabetes mellitus ([place])   . Depression   . Benign hypertensive renal disease   . PVD (peripheral vascular disease) ([place])    DIZZINESS - duration since 12/13 with ear pain, headache. - previously [facility] 12/16 for sinusitis, AOM and AOE. Rx cefdinir, norco, meclizine, cortisporin ear drops, mucinex. Still with sx on 12/22, added 10 day course of doxy, finished 1/1. Referred to ENT, hasn't heard about appt. - ear pain is improved. Headache and dizziness still there.  - dizziness provoked by head movement  Description of symptoms: off balance Duration of episode: constant since ear issue Dizziness frequency: no history of the same Provoking factors: movement Triggered by rolling over in bed: yes Triggered by bending over: yes Aggravated by head movement: yes Recent head injury: no Recent or current viral symptoms: recent sinusitis, AOM History of vasovagal episodes: no Nausea:  sometimes Vomiting: no Tinnitus: no Hearing loss: no Headache: yes Unsteady gait: sometimes Related to exertion: no Dyspnea: no Chest pain: no No fevers, otorrhea   OBJECTIVE:   BP (!) 96/58   Pulse 76   Temp 98.4 F (36.9 C) (Oral)   Ht 5\' 5"  (1.651 m)   Wt 185 lb 3.2 oz (84 kg)   SpO2 98%   BMI 30.82 kg/m   Gen: elderly, in NAD HEENT: b/l TMs visible without bulging, purulence, erythema. Canals clear.  MSK: Full ROM, strength 5/5 to U/LE bilaterally, normal gait.  No edema.  Neuro: Alert and oriented, speech normal. Optic field normal. PERRL, Extraocular movements intact.  Intact symmetric sensation to light touch of face and extremities bilaterally.  Hearing grossly intact bilaterally.  Tongue protrudes normally with no deviation.  Shoulder shrug, smile symmetric. Finger to nose normal. +Dix Hallpike.   ASSESSMENT/PLAN:   Dizziness Persistent after treatment of AOM, refractory to meclizine. CBG wnl. Did have reproduction of sx with movement, neuro exam otherwise wnl.  Orthostatics negative today but with low BP and aggravated by movement, likely contributing. Will decrease losartan to 25mg  and f/u in 2 weeks.    Myles Gip, DO

## 2020-12-31 ENCOUNTER — Ambulatory Visit: Payer: Self-pay | Admitting: Physician Assistant

## 2021-01-04 ENCOUNTER — Ambulatory Visit: Payer: Medicare Other | Admitting: Physician Assistant

## 2021-01-04 ENCOUNTER — Other Ambulatory Visit: Payer: Self-pay | Admitting: Family Medicine

## 2021-01-10 ENCOUNTER — Ambulatory Visit (INDEPENDENT_AMBULATORY_CARE_PROVIDER_SITE_OTHER): Payer: Medicare Other | Admitting: Physician Assistant

## 2021-01-10 ENCOUNTER — Encounter: Payer: Self-pay | Admitting: Physician Assistant

## 2021-01-10 ENCOUNTER — Other Ambulatory Visit: Payer: Self-pay

## 2021-01-10 VITALS — BP 137/79 | HR 86 | Ht 65.0 in | Wt 183.0 lb

## 2021-01-10 DIAGNOSIS — N3281 Overactive bladder: Secondary | ICD-10-CM

## 2021-01-10 LAB — BLADDER SCAN AMB NON-IMAGING: Scan Result: 0

## 2021-01-10 MED ORDER — MIRABEGRON ER 50 MG PO TB24: 50.0000 mg | ORAL_TABLET | Freq: Every day | ORAL | 0 refills | Status: AC

## 2021-01-10 NOTE — Progress Notes (Signed)
01/10/2021 1:53 PM   Stacy Mercado 02-Jul-1946 [id number]  CC: Chief Complaint  Patient presents with  . Over Active Bladder    HPI: Stacy Mercado is a 75 y.o. female with PMH bladder cancer s/p BCG, right renal mass s/p cryoablation, and refractory OAB now s/p intravesical Botox with Dr. Erlene Quan on 11/08/2020 as well as [name] 25 mg daily who presents today for symptom recheck and PVR.  Today she reports improvement in her urinary urgency and frequency on [name] 25 mg, however she believes there is room for improvement and wishes to increase her dose.  PVR 0 mL.    PMH: Past Medical History:  Diagnosis Date  . Arthritis   . Bladder cancer ([place]) 2017  . Chronic low back pain   . Depression   . Diabetes mellitus without complication ([place])   . History of blood transfusion   . History of iron deficiency anemia   . History of pleurisy   . History of pneumonia   . Hyperlipidemia 08/07/2016  . Hypertension   . Hypothyroidism   . Migraines   . Personal history of chemotherapy    Bladder Ca  . Urge incontinence of urine 08/07/2016    Surgical History: Past Surgical History:  Procedure Laterality Date  . ABDOMINAL HYSTERECTOMY    . APPENDECTOMY  1961  . bladder cancer removed  2014  . BOTOX INJECTION N/A 11/08/2020   Procedure: BOTOX INJECTION;  Surgeon: Hollice Espy, MD;  Location: [facility] OR[facility];  Service: Urology;  Laterality: N/A;  . CATARACT EXTRACTION W/ INTRAOCULAR LENS  IMPLANT, BILATERAL    . complete hysterectomy  1999  . DILATION AND CURETTAGE OF UTERUS  1999  . IR RADIOLOGIST EVAL & MGMT  07/20/2020  . IR RADIOLOGIST EVAL & MGMT  10/19/2020  . IR RADIOLOGIST EVAL & MGMT  12/02/2020  . L rotator cuff Left 2010  . R thumb ruptured ligament and tendon  2012  . RADIOLOGY WITH ANESTHESIA Right 10/27/2020   Procedure: IR WITH ANESTHESIA RIGHT RENAL CRYOABLATION;  Surgeon: Sandi Mariscal, MD;  Location: [facility];  Service: Anesthesiology;  Laterality: Right;  .  REVERSE SHOULDER ARTHROPLASTY Right 11/20/2017   Procedure: REVERSE SHOULDER ARTHROPLASTY;  Surgeon: Corky Mull, MD;  Location: [facility];  Service: Orthopedics;  Laterality: Right;  . SHOULDER ARTHROSCOPY WITH BICEPSTENOTOMY Right 01/30/2017   Procedure: SHOULDER ARTHROSCOPY WITH BICEPSTENOTOMY;  Surgeon: Corky Mull, MD;  Location: [facility];  Service: Orthopedics;  Laterality: Right;  biceps tenolysis  . SHOULDER ARTHROSCOPY WITH OPEN ROTATOR CUFF REPAIR Right 01/30/2017   Procedure: SHOULDER ARTHROSCOPY WITH OPEN ROTATOR CUFF REPAIR;  Surgeon: Corky Mull, MD;  Location: [facility];  Service: Orthopedics;  Laterality: Right;  Massive rotator cuff tear repair    Home Medications:  Allergies as of 01/10/2021      Reactions   Statins Other (See Comments)   myalgia      Medication List       Accurate as of January 10, 2021  1:53 PM. If you have any questions, ask your nurse or doctor.        aspirin 81 MG chewable tablet Chew 81 mg by mouth daily.   baclofen 10 MG tablet Commonly known as: LIORESAL Take 1 tablet (10 mg total) by mouth at bedtime. What changed:   when to take this  reasons to take this   escitalopram 10 MG tablet Commonly known as: LEXAPRO TAKE 1 TABLET(10 MG) BY MOUTH DAILY   gabapentin  300 MG capsule Commonly known as: NEURONTIN TAKE 1 CAPSULE(300 MG) BY MOUTH AT BEDTIME   HYDROcodone-acetaminophen 5-325 MG tablet Commonly known as: NORCO/VICODIN Take 1-2 tablets by mouth every 6 (six) hours as needed for moderate pain.   ICY HOT LIDOCAINE PLUS MENTHOL EX Apply 1 application topically 2 (two) times daily as needed (muscle pain).   levothyroxine 50 MCG tablet Commonly known as: SYNTHROID TAKE 1 TABLET BY MOUTH DAILY BEFORE BREAKFAST What changed: See the new instructions.   losartan 50 MG tablet Commonly known as: COZAAR Take 0.5 tablets (25 mg total) by mouth daily.   meclizine 12.5 MG tablet Commonly known as: ANTIVERT Take by mouth.    metFORMIN 500 MG 24 hr tablet Commonly known as: GLUCOPHAGE-XR TAKE 2 TABLETS(1000 MG) BY MOUTH IN THE MORNING AND AT BEDTIME   mirabegron ER 25 MG Tb24 tablet Commonly known as: [name] Take 1 tablet (25 mg total) by mouth daily.   montelukast 10 MG tablet Commonly known as: SINGULAIR Take 1 tablet (10 mg total) by mouth at bedtime. What changed:   when to take this  reasons to take this   multivitamin with minerals Tabs tablet Take 1 tablet by mouth daily.   naproxen 500 MG tablet Commonly known as: NAPROSYN Take 1 tablet (500 mg total) by mouth 2 (two) times daily with a meal.   Repatha SureClick 140 MG/ML [facility] Generic drug: Evolocumab Inject 140 mg into the skin every 14 (fourteen) days.       Allergies:  Allergies  Allergen Reactions  . Statins Other (See Comments)    myalgia    Family History: Family History  Problem Relation Age of Onset  . Hypertension Brother   . Diabetes Brother   . Healthy Son   . Healthy Son   . Healthy Daughter   . Kidney cancer Neg Hx   . Bladder Cancer Neg Hx   . Breast cancer Neg Hx     Social History:   reports that she quit smoking about 14 years ago. Her smoking use included cigarettes. She quit after 50.00 years of use. She has never used smokeless tobacco. She reports current alcohol use of about 7.0 standard drinks of alcohol per week. She reports that she does not use drugs.  Physical Exam: BP 137/79   Pulse 86   Ht 5\' 5"  (1.651 m)   Wt 183 lb (83 kg)   BMI 30.45 kg/m   Constitutional:  Alert and oriented, no acute distress, nontoxic appearing HEENT: [place], AT Cardiovascular: No clubbing, cyanosis, or edema Respiratory: Normal respiratory effort, no increased work of breathing Skin: No rashes, bruises or suspicious lesions Neurologic: Grossly intact, no focal deficits, moving all 4 extremities Psychiatric: Normal mood and affect  Laboratory Data: Results for orders placed or performed in visit on 01/10/21   Bladder Scan (Post Void Residual) in office  Result Value Ref Range   Scan Result 0    Assessment & Plan:   1. OAB (overactive bladder) Symptomatic improvement on combination therapy intravesical Botox and [name] 25 mg.  PVR WNL today.  Patient would like to try an increased dose of [name], I am in agreement with this plan.  Samples provided today, will follow up with virtual visit in 4 weeks. - Bladder Scan (Post Void Residual) in office - mirabegron ER ([name]) 50 MG TB24 tablet; Take 1 tablet (50 mg total) by mouth daily.  Dispense: 28 tablet; Refill: 0   Return in about 4 weeks (around 02/07/2021) for  Symptom recheck virtual visit.  Debroah Loop, PA-C  [facility] [address], [place], [place] [address] [phone]

## 2021-01-11 ENCOUNTER — Encounter: Payer: Self-pay | Admitting: Family Medicine

## 2021-01-11 ENCOUNTER — Other Ambulatory Visit: Payer: Self-pay

## 2021-01-11 ENCOUNTER — Ambulatory Visit (INDEPENDENT_AMBULATORY_CARE_PROVIDER_SITE_OTHER): Payer: Medicare Other | Admitting: Family Medicine

## 2021-01-11 VITALS — BP 126/76 | HR 74 | Temp 97.7°F | Wt 186.8 lb

## 2021-01-11 DIAGNOSIS — E782 Mixed hyperlipidemia: Secondary | ICD-10-CM | POA: Diagnosis not present

## 2021-01-11 DIAGNOSIS — F3341 Major depressive disorder, recurrent, in partial remission: Secondary | ICD-10-CM

## 2021-01-11 DIAGNOSIS — G8929 Other chronic pain: Secondary | ICD-10-CM | POA: Diagnosis not present

## 2021-01-11 DIAGNOSIS — R42 Dizziness and giddiness: Secondary | ICD-10-CM

## 2021-01-11 DIAGNOSIS — C641 Malignant neoplasm of right kidney, except renal pelvis: Secondary | ICD-10-CM

## 2021-01-11 DIAGNOSIS — C679 Malignant neoplasm of bladder, unspecified: Secondary | ICD-10-CM

## 2021-01-11 DIAGNOSIS — R519 Headache, unspecified: Secondary | ICD-10-CM

## 2021-01-11 DIAGNOSIS — E119 Type 2 diabetes mellitus without complications: Secondary | ICD-10-CM | POA: Diagnosis not present

## 2021-01-11 DIAGNOSIS — I129 Hypertensive chronic kidney disease with stage 1 through stage 4 chronic kidney disease, or unspecified chronic kidney disease: Secondary | ICD-10-CM

## 2021-01-11 LAB — BAYER DCA HB A1C WAIVED: HB A1C (BAYER DCA - WAIVED): 6.3 % (ref <7.0)

## 2021-01-11 NOTE — Progress Notes (Signed)
BP 126/76   Pulse 74   Temp 97.7 F (36.5 C)   Wt 186 lb 12.8 oz (84.7 kg)   SpO2 96%   BMI 31.09 kg/m    Subjective:    Patient ID: Stacy Mercado, female    DOB: [birth year], 75 y.o.   MRN: [id number]  HPI: [name] is a 75 y.o. female  Chief Complaint  Patient presents with  . Diabetes  . Dizziness   DIZZINESS Duration: about a month and a half Description of symptoms: lightheaded Duration of episode: constant Dizziness frequency: recurrent, constant Provoking factors: constant Triggered by rolling over in bed: yes Triggered by bending over: no Aggravated by head movement: no Aggravated by exertion, coughing, loud noises: no Recent head injury: no Recent or current viral symptoms: no History of vasovagal episodes: no Nausea: no Vomiting: no Tinnitus: no Hearing loss: no Aural fullness: no Headache: yes Photophobia/phonophobia: no Unsteady gait: yes Postural instability: yes Diplopia: yes Dysarthria, dysphagia or weakness: no Related to exertion: no Pallor: yes Diaphoresis: no Dyspnea: no Chest pain: no  HYPERTENSION / HYPERLIPIDEMIA Satisfied with current treatment? yes Duration of hypertension: chronic BP monitoring frequency: not checking BP medication side effects: no Past BP meds: losartan Duration of hyperlipidemia: chronic Cholesterol medication side effects: no Cholesterol supplements: none Past cholesterol medications: repatha Medication compliance: excellent compliance Aspirin: no Recent stressors: yes Recurrent headaches: yes Visual changes: no Palpitations: no Dyspnea: no Chest pain: no Lower extremity edema: no Dizzy/lightheaded: yes  DIABETES Hypoglycemic episodes:no Polydipsia/polyuria: no Visual disturbance: no Chest pain: no Paresthesias: no Glucose Monitoring: yes Taking Insulin?: no Blood Pressure Monitoring: not checking Retinal Examination: Not up to Date Foot Exam: Not up to Date Diabetic Education:  Completed Pneumovax: Up to Date Influenza: Up to Date Aspirin: no  DEPRESSION Mood status: controlled Satisfied with current treatment?: yes Symptom severity: mild  Duration of current treatment : chronic Side effects: no Medication compliance: excellent compliance Psychotherapy/counseling: no  Previous psychiatric medications: lexapro Depressed mood: no Anxious mood: no Anhedonia: no Significant weight loss or gain: no Insomnia: no  Fatigue: yes Feelings of worthlessness or guilt: no Impaired concentration/indecisiveness: no Suicidal ideations: no Hopelessness: no Crying spells: no Depression screen [facility] 2/9 01/14/2021 12/28/2020 04/01/2020 01/02/2020 03/31/2019  Decreased Interest 0 0 0 0 0  Down, Depressed, Hopeless 0 0 0 1 0  PHQ - 2 Score 0 0 0 1 0  Altered sleeping - '3 1 3 ' 0  Tired, decreased energy - '3 1 3 ' 0  Change in appetite - 3 0 0 0  Feeling bad or failure about yourself  - 0 0 0 0  Trouble concentrating - 0 0 0 0  Moving slowly or fidgety/restless - 0 0 0 0  Suicidal thoughts - 0 - 0 0  PHQ-9 Score - '9 2 7 ' 0  Difficult doing work/chores - - Not difficult at all Not difficult at all Not difficult at all     Relevant past medical, surgical, family and social history reviewed and updated as indicated. Interim medical history since our last visit reviewed. Allergies and medications reviewed and updated.  Review of Systems  Constitutional: Positive for fatigue. Negative for activity change, appetite change, chills, diaphoresis, fever and unexpected weight change.  HENT: Negative.   Respiratory: Negative.   Cardiovascular: Negative.   Gastrointestinal: Negative.   Musculoskeletal: Negative.   Neurological: Positive for dizziness, light-headedness and headaches. Negative for tremors, seizures, syncope, facial asymmetry, speech difficulty, weakness and numbness.  Psychiatric/Behavioral: Negative.  Per HPI unless specifically indicated above     Objective:     BP 126/76   Pulse 74   Temp 97.7 F (36.5 C)   Wt 186 lb 12.8 oz (84.7 kg)   SpO2 96%   BMI 31.09 kg/m   Wt Readings from Last 3 Encounters:  01/14/21 186 lb (84.4 kg)  01/11/21 186 lb 12.8 oz (84.7 kg)  01/10/21 183 lb (83 kg)    Physical Exam Vitals and nursing note reviewed.  Constitutional:      General: She is not in acute distress.    Appearance: Normal appearance. She is not ill-appearing, toxic-appearing or diaphoretic.  HENT:     Head: Normocephalic and atraumatic.     Right Ear: External ear normal.     Left Ear: External ear normal.     Nose: Nose normal.     Mouth/Throat:     Mouth: Mucous membranes are moist.     Pharynx: Oropharynx is clear.  Eyes:     General: No scleral icterus.       Right eye: No discharge.        Left eye: No discharge.     Extraocular Movements: Extraocular movements intact.     Right eye: No nystagmus.     Left eye: No nystagmus.     Conjunctiva/sclera: Conjunctivae normal.     Pupils: Pupils are equal, round, and reactive to light.  Cardiovascular:     Rate and Rhythm: Normal rate and regular rhythm.     Pulses: Normal pulses.     Heart sounds: Normal heart sounds. No murmur heard. No friction rub. No gallop.   Pulmonary:     Effort: Pulmonary effort is normal. No respiratory distress.     Breath sounds: Normal breath sounds. No stridor. No wheezing, rhonchi or rales.  Chest:     Chest wall: No tenderness.  Musculoskeletal:        General: Normal range of motion.     Cervical back: Normal range of motion and neck supple.  Skin:    General: Skin is warm and dry.     Capillary Refill: Capillary refill takes less than 2 seconds.     Coloration: Skin is not jaundiced or pale.     Findings: No bruising, erythema, lesion or rash.  Neurological:     General: No focal deficit present.     Mental Status: She is alert and oriented to person, place, and time. Mental status is at baseline.     Cranial Nerves: No cranial nerve  deficit.     Sensory: No sensory deficit.     Motor: No weakness.     Coordination: Coordination normal.     Gait: Gait normal.     Deep Tendon Reflexes: Reflexes normal.  Psychiatric:        Mood and Affect: Mood normal.        Behavior: Behavior normal.        Thought Content: Thought content normal.        Judgment: Judgment normal.     Results for orders placed or performed in visit on 01/11/21  Comprehensive metabolic panel  Result Value Ref Range   Glucose 167 (H) 65 - 99 mg/dL   BUN 14 8 - 27 mg/dL   Creatinine, Ser 0.79 0.57 - 1.00 mg/dL   GFR calc non Af Amer 73 >59 mL/min/1.73   GFR calc Af Amer 84 >59 mL/min/1.73   BUN/Creatinine Ratio 18 12 - 28  Sodium 144 134 - 144 mmol/L   Potassium 4.6 3.5 - 5.2 mmol/L   Chloride 103 96 - 106 mmol/L   CO2 26 20 - 29 mmol/L   Calcium 10.5 (H) 8.7 - 10.3 mg/dL   Total Protein 6.3 6.0 - 8.5 g/dL   Albumin 4.1 3.7 - 4.7 g/dL   Globulin, Total 2.2 1.5 - 4.5 g/dL   Albumin/Globulin Ratio 1.9 1.2 - 2.2   Bilirubin Total 0.5 0.0 - 1.2 mg/dL   Alkaline Phosphatase 69 44 - 121 IU/L   AST 22 0 - 40 IU/L   ALT 23 0 - 32 IU/L  Bayer DCA Hb A1c Waived  Result Value Ref Range   HB A1C (BAYER DCA - WAIVED) 6.3 <7.0 %  Lipid Panel w/o Chol/HDL Ratio  Result Value Ref Range   Cholesterol, Total 124 100 - 199 mg/dL   Triglycerides 146 0 - 149 mg/dL   HDL 42 >39 mg/dL   VLDL Cholesterol Cal 25 5 - 40 mg/dL   LDL Chol Calc (NIH) 57 0 - 99 mg/dL      Assessment & Plan:   Problem List Items Addressed This Visit      Endocrine   Controlled type 2 diabetes mellitus ([place]) - Primary    Doing well with a1c of 6.3. Continue current regimen. Continue to monitor. Call with any concerns.       Relevant Medications   metFORMIN (GLUCOPHAGE-XR) 500 MG 24 hr tablet   losartan (COZAAR) 25 MG tablet   Other Relevant Orders   Comprehensive metabolic panel (Completed)   Bayer DCA Hb A1c Waived (Completed)     Genitourinary   Bladder cancer  [facility])    Following with urology. Continue to monitor. Call with any concerns.       Relevant Medications   ALPRAZolam (XANAX) 0.5 MG tablet   Benign hypertensive renal disease    Under good control on current regimen. Continue current regimen. Continue to monitor. Call with any concerns. Refills given. Labs drawn today.        Relevant Orders   Comprehensive metabolic panel (Completed)   Renal cell carcinoma of right kidney [facility])    Following with urology. Continue to monitor. Call with any concerns.       Relevant Medications   ALPRAZolam (XANAX) 0.5 MG tablet   Other Relevant Orders   MR Brain W Wo Contrast     Other   Depression    Under good control on current regimen. Continue current regimen. Continue to monitor. Call with any concerns. Refills given. Labs drawn today.        Relevant Medications   escitalopram (LEXAPRO) 10 MG tablet   ALPRAZolam (XANAX) 0.5 MG tablet   Hyperlipidemia    Under good control on current regimen. Continue current regimen. Continue to monitor. Call with any concerns. Refills given. Labs drawn today.        Relevant Medications   Evolocumab (REPATHA SURECLICK) 655 MG/ML SOAJ   losartan (COZAAR) 25 MG tablet   Other Relevant Orders   Comprehensive metabolic panel (Completed)   Lipid Panel w/o Chol/HDL Ratio (Completed)   Dizziness    Stable for 6 weeks without any improvement. No sign of vertigo. History of bladder and renal cell carcinoma. Concern for mass, met or stroke. Will obtain MRI w and w/o. Await results. Will refer to ENT for further evaluation- may also need to see neurology. Call with any concerns.       Relevant Orders  MR Brain W Wo Contrast   Ambulatory referral to ENT    Other Visit Diagnoses    Chronic nonintractable headache, unspecified headache type       See discussion under dizziness.    Relevant Medications   escitalopram (LEXAPRO) 10 MG tablet   naproxen (NAPROSYN) 500 MG tablet   gabapentin (NEURONTIN)  300 MG capsule   Other Relevant Orders   MR Brain W Wo Contrast       Follow up plan: Return after MRI.  >50 minutes spent with patient today.

## 2021-01-11 NOTE — Patient Instructions (Signed)
How to Perform the Epley Maneuver The Epley maneuver is an exercise that relieves symptoms of vertigo. Vertigo is the feeling that you or your surroundings are moving when they are not. When you feel vertigo, you may feel like the room is spinning and may have trouble walking. The Epley maneuver is used for a type of vertigo caused by a calcium deposit in a part of the inner ear. The maneuver involves changing head positions to help the deposit move out of the area. You can do this maneuver at home whenever you have symptoms of vertigo. You can repeat it in 24 hours if your vertigo has not gone away. Even though the Epley maneuver may relieve your vertigo for a few weeks, it is possible that your symptoms will return. This maneuver relieves vertigo, but it does not relieve dizziness. What are the risks? If it is done correctly, the Epley maneuver is considered safe. Sometimes it can lead to dizziness or nausea that goes away after a short time. If you develop other symptoms--such as changes in vision, weakness, or numbness--stop doing the maneuver and call your health care provider. Supplies needed:  A bed or table.  A pillow. How to do the Epley maneuver 1. Sit on the edge of a bed or table with your back straight and your legs extended or hanging over the edge of the bed or table. 2. Turn your head halfway toward the affected ear or side as told by your health care provider. 3. Lie backward quickly with your head turned until you are lying flat on your back. You may want to position a pillow under your shoulders. 4. Hold this position for at least 30 seconds. If you feel dizzy or have symptoms of vertigo, continue to hold the position until the symptoms stop. 5. Turn your head to the opposite direction until your unaffected ear is facing the floor. 6. Hold this position for at least 30 seconds. If you feel dizzy or have symptoms of vertigo, continue to hold the position until the symptoms  stop. 7. Turn your whole body to the same side as your head so that you are positioned on your side. Your head will now be nearly facedown. Hold for at least 30 seconds. If you feel dizzy or have symptoms of vertigo, continue to hold the position until the symptoms stop. 8. Sit back up. You can repeat the maneuver in 24 hours if your vertigo does not go away.      Follow these instructions at home: For 24 hours after doing the Epley maneuver:  Keep your head in an upright position.  When lying down to sleep or rest, keep your head raised (elevated) with two or more pillows.  Avoid excessive neck movements. Activity  Do not drive or use machinery if you feel dizzy.  After doing the Epley maneuver, return to your normal activities as told by your health care provider. Ask your health care provider what activities are safe for you. General instructions  Drink enough fluid to keep your urine pale yellow.  Do not drink alcohol.  Take over-the-counter and prescription medicines only as told by your health care provider.  Keep all follow-up visits as told by your health care provider. This is important. Preventing vertigo symptoms Ask your health care provider if there is anything you should do at home to prevent vertigo. He or she may recommend that you:  Keep your head elevated with two or more pillows while you sleep.    Do not sleep on the side of your affected ear.  Get up slowly from bed.  Avoid sudden movements during the day.  Avoid extreme head positions or movement, such as looking up or bending over. Contact a health care provider if:  Your vertigo gets worse.  You have other symptoms, including: ? Nausea. ? Vomiting. ? Headache. Get help right away if you:  Have vision changes.  Have a headache or neck pain that is severe or getting worse.  Cannot stop vomiting.  Have new numbness or weakness in any part of your body. Summary  Vertigo is the feeling that  you or your surroundings are moving when they are not.  The Epley maneuver is an exercise that relieves symptoms of vertigo.  If the Epley maneuver is done correctly, it is considered safe and relieves vertigo quickly. This information is not intended to replace advice given to you by your health care provider. Make sure you discuss any questions you have with your health care provider. Document Revised: 10/01/2019 Document Reviewed: 10/01/2019 Elsevier Patient Education  2021 [employer]  

## 2021-01-12 ENCOUNTER — Telehealth: Payer: Medicare Other | Admitting: Pharmacist

## 2021-01-12 LAB — COMPREHENSIVE METABOLIC PANEL
ALT: 23 IU/L (ref 0–32)
AST: 22 IU/L (ref 0–40)
Albumin/Globulin Ratio: 1.9 (ref 1.2–2.2)
Albumin: 4.1 g/dL (ref 3.7–4.7)
Alkaline Phosphatase: 69 IU/L (ref 44–121)
BUN/Creatinine Ratio: 18 (ref 12–28)
BUN: 14 mg/dL (ref 8–27)
Bilirubin Total: 0.5 mg/dL (ref 0.0–1.2)
CO2: 26 mmol/L (ref 20–29)
Calcium: 10.5 mg/dL — ABNORMAL HIGH (ref 8.7–10.3)
Chloride: 103 mmol/L (ref 96–106)
Creatinine, Ser: 0.79 mg/dL (ref 0.57–1.00)
GFR calc Af Amer: 84 mL/min/{1.73_m2} (ref >59)
GFR calc non Af Amer: 73 mL/min/{1.73_m2} (ref >59)
Globulin, Total: 2.2 g/dL (ref 1.5–4.5)
Glucose: 167 mg/dL — ABNORMAL HIGH (ref 65–99)
Potassium: 4.6 mmol/L (ref 3.5–5.2)
Sodium: 144 mmol/L (ref 134–144)
Total Protein: 6.3 g/dL (ref 6.0–8.5)

## 2021-01-12 LAB — LIPID PANEL W/O CHOL/HDL RATIO
Cholesterol, Total: 124 mg/dL (ref 100–199)
HDL: 42 mg/dL (ref >39)
LDL Chol Calc (NIH): 57 mg/dL (ref 0–99)
Triglycerides: 146 mg/dL (ref 0–149)
VLDL Cholesterol Cal: 25 mg/dL (ref 5–40)

## 2021-01-12 NOTE — Chronic Care Management (AMB) (Unsigned)
Chronic Care Management Pharmacy  Name: Stacy Mercado  MRN: [id number] DOB: 02/18/1946   Chief Complaint/ HPI  Stacy Mercado,  75 y.o. , female presents for their Follow-Up CCM visit with the clinical pharmacist via telephone.  PCP : Stacy Roys, DO Patient Care Team: Stacy Roys, DO as PCP - General (Family Medicine) Stacy Mercado, [facility] as Pharmacist (Pharmacist)  Their chronic conditions include: Hyperlipidemia, Diabetes, Hypothyroidism, Depression, Osteopenia and PVD OAB with H/O Bladder cancer and rt renal lesion, hypertension  Office Visits: 01/11/21- Dr. Wynetta Emery- bloodwork 01/10/21- Bonnye Fava, PA-C- Urology- myrbetriq 50 mg , PVR WNL 12/28/20- Dr. Ky Barban- losartan decrease to 25 mg qd, orthostatics checked 07/15/20 [name], PharmD- CCM -pt self decreased losartan to 68mg, not checking BP 07/06/20 Dr. JWynetta Emery- labs, referral to Phys med, referral ophthalmology  Consult Visit: 11/08/20- Dr. BErlene Quan intravesicular Botox 09/21/20- [name] samples from Urology 06/2720 - Dr. MAlba Destine- MRI asses for epidural  injection  Allergies  Allergen Reactions  . Statins Other (See Comments)    myalgia    Medications: Outpatient Encounter Medications as of 01/12/2021  Medication Sig  . aspirin 81 MG chewable tablet Chew 81 mg by mouth daily.   . baclofen (LIORESAL) 10 MG tablet Take 1 tablet (10 mg total) by mouth at bedtime. (Patient taking differently: Take 10 mg by mouth at bedtime as needed for muscle spasms.)  . escitalopram (LEXAPRO) 10 MG tablet TAKE 1 TABLET(10 MG) BY MOUTH DAILY  . Evolocumab (REPATHA SURECLICK) 1144MG/ML SOAJ Inject 140 mg into the skin every 14 (fourteen) days.  .Marland Kitchengabapentin (NEURONTIN) 300 MG capsule TAKE 1 CAPSULE(300 MG) BY MOUTH AT BEDTIME  . HYDROcodone-acetaminophen (NORCO/VICODIN) 5-325 MG tablet Take 1-2 tablets by mouth every 6 (six) hours as needed for moderate pain.  .Marland Kitchenlevothyroxine (SYNTHROID) 50 MCG tablet TAKE 1  TABLET BY MOUTH DAILY BEFORE BREAKFAST (Patient taking differently: Take 50 mcg by mouth daily before breakfast.)  . Lidocaine-Menthol (ICY HOT LIDOCAINE PLUS MENTHOL EX) Apply 1 application topically 2 (two) times daily as needed (muscle pain).  .Marland Kitchenlosartan (COZAAR) 50 MG tablet Take 0.5 tablets (25 mg total) by mouth daily.  . metFORMIN (GLUCOPHAGE-XR) 500 MG 24 hr tablet TAKE 2 TABLETS(1000 MG) BY MOUTH IN THE MORNING AND AT BEDTIME  . mirabegron ER ([name]) 50 MG TB24 tablet Take 1 tablet (50 mg total) by mouth daily.  . montelukast (SINGULAIR) 10 MG tablet Take 1 tablet (10 mg total) by mouth at bedtime. (Patient taking differently: Take 10 mg by mouth at bedtime as needed (allergies).)  . Multiple Vitamin (MULTIVITAMIN WITH MINERALS) TABS tablet Take 1 tablet by mouth daily.  . naproxen (NAPROSYN) 500 MG tablet Take 1 tablet (500 mg total) by mouth 2 (two) times daily with a meal.   No facility-administered encounter medications on file as of 01/12/2021.    Wt Readings from Last 3 Encounters:  01/11/21 186 lb 12.8 oz (84.7 kg)  01/10/21 183 lb (83 kg)  12/28/20 185 lb 3.2 oz (84 kg)    Current Diagnosis/Assessment:    Goals Addressed   None    Diabetes   A1c goal <7%  Recent Relevant Labs: Lab Results  Component Value Date/Time   HGBA1C 6.3 01/11/2021 09:59 AM   HGBA1C 7.3 (H) 10/25/2020 01:23 PM   HGBA1C 6.4 07/06/2020 11:23 AM   HGBA1C 5.3 05/24/2016 12:00 AM   HGBA1C 5.9 12/29/2015 12:00 AM   MICROALBUR 10 01/02/2020 09:23 AM  MICROALBUR 10 03/31/2019 01:51 PM    Last diabetic Eye exam:  Lab Results  Component Value Date/Time   HMDIABEYEEXA No Retinopathy 09/12/2016 04:27 PM    Last diabetic Foot exam: No results found for: HMDIABFOOTEX   Checking BG: Never   Patient has failed these meds in past: NA Patient is currently controlled on the following medications: Marland Kitchen Metformin XR  1066m bid  We discussed: Diet and exercise. Patient states she his  controlled per her A1c readings and does not check BG at home. Reviewed s/sxs of hypoglycemia.  Plan  Continue current medications  Hypertension   BP goal is:  <130/80  Office blood pressures are  BP Readings from Last 3 Encounters:  01/11/21 126/76  01/10/21 137/79  12/28/20 (!) 96/58   Patient checks BP at home Never Patient home BP readings are ranging: Not checking  Patient has failed these meds in the past: NA Patient is currently query  controlled on the following medications:  . Losartan 25 mg qd  We discussed diet and exercise extensively and the importance of checking BP at home. Reviewed in office readings and goal BP. Encouraged patient to obtain BP monitor and begin checking. Patient is following up with nephrology for renal lesion on rt kidney. Has previously declined intervention d/t COVID concerns  Plan  Continue current medications     OAB/HX bladder Cancer   Patient has failed these meds in past: NA Patient is currently controlled on the following medication: .Marland KitchenMyrbetriq (samples from urology office)  We discussed:    Plan  Continue current medications   Hyperlipidemia   LDL goal < 70  Lipid Panel     Component Value Date/Time   CHOL 124 01/11/2021 1000   CHOL 211 (H) 08/14/2016 1025   TRIG 146 01/11/2021 1000   TRIG 226 (H) 08/14/2016 1025   HDL 42 01/11/2021 1000   LDLCALC 57 01/11/2021 1000    Hepatic Function Latest Ref Rng & Units 01/11/2021 05/03/2020 01/02/2020  Total Protein 6.0 - 8.5 g/dL 6.3 6.7 6.0  Albumin 3.7 - 4.7 g/dL 4.1 4.0 3.9  AST 0 - 40 IU/L '22 19 20  ' ALT 0 - 32 IU/L '23 28 29  ' Alk Phosphatase 44 - 121 IU/L 69 86 93  Total Bilirubin 0.0 - 1.2 mg/dL 0.5 0.6 0.4     The ASCVD Risk score ([place], et al., 2013) failed to calculate for the following reasons:   The valid total cholesterol range is 130 to 320 mg/dL   Patient has failed these meds in past: NA Patient is currently controlled on the following  medications:  . Repatha 140 mg q14 d  ([employer] funding $2500/year)  We discussed:  Discussed with patient that [employer] Energy[employer] no longer available for hypercholesterolemia. Will ask CPA to initiate PAP for Repatha to avoid gap in therapy.   Plan  Continue current medications  Hypothyroidism   Lab Results  Component Value Date/Time   TSH 3.900 01/02/2020 09:29 AM   TSH 3.240 03/31/2019 01:54 PM    Patient has failed these meds in past: NA Patient is currently controlled on the following medications:  . Levothyroxine 50 mcg qd  We discussed:  Importance of taking at he same time each day.  Plan  Continue current medications  Depression / Anxiety   PHQ9 Score:  PHQ9 SCORE ONLY 12/28/2020 04/01/2020 01/02/2020  PHQ-9 Total Score '9 2 7   ' GAD7 Score: No flowsheet data found.  Patient has  failed these meds in past: NA Patient is currently controlled on the following medications:  . Escitopram 10 mg qd  Willl discuss at follow up.  Plan  Continue current medications  Sciatica/ Chronic Back pain   Patient has failed these meds in past: NA Patient is currently controlled on the following medications:  . Gabapentin 600 mg qpm . Baclofen 10 mg qpm prn . Naproxen 500 mg bid .   We discussed:  Patient had an epidural  Injection in her back on 09/08/20 by Dr. Alba Destine. She reports marked improvement in her pain. She rarely takes baclofen.   Plan  Continue current medications   Medication Management   Pt uses [facility] for all medications Uses pill box?  Pt endorses 95% compliance  We discussed: Current pharmacy is preferred with insurance plan and patient is satisfied with pharmacy services  Plan  Continue current medication management strategy    Follow up: 3 month phone visit  Junita Push. Kenton Kingfisher PharmD, [facility] [phone]

## 2021-01-13 ENCOUNTER — Ambulatory Visit: Payer: Medicare Other

## 2021-01-14 ENCOUNTER — Ambulatory Visit (INDEPENDENT_AMBULATORY_CARE_PROVIDER_SITE_OTHER): Payer: Medicare Other

## 2021-01-14 VITALS — Ht 65.0 in | Wt 186.0 lb

## 2021-01-14 DIAGNOSIS — Z0000 Encounter for general adult medical examination without abnormal findings: Secondary | ICD-10-CM

## 2021-01-14 NOTE — Patient Instructions (Signed)
Stacy Mercado , Thank you for taking time to come for your Medicare Wellness Visit. I appreciate your ongoing commitment to your health goals. Please review the following plan we discussed and let me know if I can assist you in the future.   Screening recommendations/referrals: Colonoscopy: not required Mammogram: patient to schedule Bone Density: completed 06/12/2018 Recommended yearly ophthalmology/optometry visit for glaucoma screening and checkup Recommended yearly dental visit for hygiene and checkup  Vaccinations: Influenza vaccine: completed 09/25/2020, due 07/18/2021 Pneumococcal vaccine: completed 10/28/2020 Tdap vaccine: completed 11/28/2016, due 11/28/2026 Shingles vaccine: discussed   Covid-19: 09/25/2020, 02/03/2020, 01/13/2020  Advanced directives: Advance directive discussed with you today.  Conditions/risks identified: none  Next appointment: Follow up in one year for your annual wellness visit    Preventive Care 65 Years and Older, Female Preventive care refers to lifestyle choices and visits with your health care provider that can promote health and wellness. What does preventive care include?  A yearly physical exam. This is also called an annual well check.  Dental exams once or twice a year.  Routine eye exams. Ask your health care provider how often you should have your eyes checked.  Personal lifestyle choices, including:  Daily care of your teeth and gums.  Regular physical activity.  Eating a healthy diet.  Avoiding tobacco and drug use.  Limiting alcohol use.  Practicing safe sex.  Taking low-dose aspirin every day.  Taking vitamin and mineral supplements as recommended by your health care provider. What happens during an annual well check? The services and screenings done by your health care provider during your annual well check will depend on your age, overall health, lifestyle risk factors, and family history of disease. Counseling  Your  health care provider may ask you questions about your:  Alcohol use.  Tobacco use.  Drug use.  Emotional well-being.  Home and relationship well-being.  Sexual activity.  Eating habits.  History of falls.  Memory and ability to understand (cognition).  Work and work Statistician.  Reproductive health. Screening  You may have the following tests or measurements:  Height, weight, and BMI.  Blood pressure.  Lipid and cholesterol levels. These may be checked every 5 years, or more frequently if you are over 52 years old.  Skin check.  Lung cancer screening. You may have this screening every year starting at age 18 if you have a 30-pack-year history of smoking and currently smoke or have quit within the past 15 years.  Fecal occult blood test (FOBT) of the stool. You may have this test every year starting at age 107.  Flexible sigmoidoscopy or colonoscopy. You may have a sigmoidoscopy every 5 years or a colonoscopy every 10 years starting at age 76.  Hepatitis C blood test.  Hepatitis B blood test.  Sexually transmitted disease (STD) testing.  Diabetes screening. This is done by checking your blood sugar (glucose) after you have not eaten for a while (fasting). You may have this done every 1-3 years.  Bone density scan. This is done to screen for osteoporosis. You may have this done starting at age 76.  Mammogram. This may be done every 1-2 years. Talk to your health care provider about how often you should have regular mammograms. Talk with your health care provider about your test results, treatment options, and if necessary, the need for more tests. Vaccines  Your health care provider may recommend certain vaccines, such as:  Influenza vaccine. This is recommended every year.  Tetanus, diphtheria, and acellular  pertussis (Tdap, Td) vaccine. You may need a Td booster every 10 years.  Zoster vaccine. You may need this after age 83.  Pneumococcal 13-valent  conjugate (PCV13) vaccine. One dose is recommended after age 27.  Pneumococcal polysaccharide (PPSV23) vaccine. One dose is recommended after age 69. Talk to your health care provider about which screenings and vaccines you need and how often you need them. This information is not intended to replace advice given to you by your health care provider. Make sure you discuss any questions you have with your health care provider. Document Released: 12/31/2015 Document Revised: 08/23/2016 Document Reviewed: 10/05/2015 Elsevier Interactive Patient Education  2017 [place] Prevention in the Home Falls can cause injuries. They can happen to people of all ages. There are many things you can do to make your home safe and to help prevent falls. What can I do on the outside of my home?  Regularly fix the edges of walkways and driveways and fix any cracks.  Remove anything that might make you trip as you walk through a door, such as a raised step or threshold.  Trim any bushes or trees on the path to your home.  Use bright outdoor lighting.  Clear any walking paths of anything that might make someone trip, such as rocks or tools.  Regularly check to see if handrails are loose or broken. Make sure that both sides of any steps have handrails.  Any raised decks and porches should have guardrails on the edges.  Have any leaves, snow, or ice cleared regularly.  Use sand or salt on walking paths during winter.  Clean up any spills in your garage right away. This includes oil or grease spills. What can I do in the bathroom?  Use night lights.  Install grab bars by the toilet and in the tub and shower. Do not use towel bars as grab bars.  Use non-skid mats or decals in the tub or shower.  If you need to sit down in the shower, use a plastic, non-slip stool.  Keep the floor dry. Clean up any water that spills on the floor as soon as it happens.  Remove soap buildup in the tub or  shower regularly.  Attach bath mats securely with double-sided non-slip rug tape.  Do not have throw rugs and other things on the floor that can make you trip. What can I do in the bedroom?  Use night lights.  Make sure that you have a light by your bed that is easy to reach.  Do not use any sheets or blankets that are too big for your bed. They should not hang down onto the floor.  Have a firm chair that has side arms. You can use this for support while you get dressed.  Do not have throw rugs and other things on the floor that can make you trip. What can I do in the kitchen?  Clean up any spills right away.  Avoid walking on wet floors.  Keep items that you use a lot in easy-to-reach places.  If you need to reach something above you, use a strong step stool that has a grab bar.  Keep electrical cords out of the way.  Do not use floor polish or wax that makes floors slippery. If you must use wax, use non-skid floor wax.  Do not have throw rugs and other things on the floor that can make you trip. What can I do with my stairs?  Do not leave any items on the stairs.  Make sure that there are handrails on both sides of the stairs and use them. Fix handrails that are broken or loose. Make sure that handrails are as long as the stairways.  Check any carpeting to make sure that it is firmly attached to the stairs. Fix any carpet that is loose or worn.  Avoid having throw rugs at the top or bottom of the stairs. If you do have throw rugs, attach them to the floor with carpet tape.  Make sure that you have a light switch at the top of the stairs and the bottom of the stairs. If you do not have them, ask someone to add them for you. What else can I do to help prevent falls?  Wear shoes that:  Do not have high heels.  Have rubber bottoms.  Are comfortable and fit you well.  Are closed at the toe. Do not wear sandals.  If you use a stepladder:  Make sure that it is fully  opened. Do not climb a closed stepladder.  Make sure that both sides of the stepladder are locked into place.  Ask someone to hold it for you, if possible.  Clearly mark and make sure that you can see:  Any grab bars or handrails.  First and last steps.  Where the edge of each step is.  Use tools that help you move around (mobility aids) if they are needed. These include:  Canes.  Walkers.  Scooters.  Crutches.  Turn on the lights when you go into a dark area. Replace any light bulbs as soon as they burn out.  Set up your furniture so you have a clear path. Avoid moving your furniture around.  If any of your floors are uneven, fix them.  If there are any pets around you, be aware of where they are.  Review your medicines with your doctor. Some medicines can make you feel dizzy. This can increase your chance of falling. Ask your doctor what other things that you can do to help prevent falls. This information is not intended to replace advice given to you by your health care provider. Make sure you discuss any questions you have with your health care provider. Document Released: 09/30/2009 Document Revised: 05/11/2016 Document Reviewed: 01/08/2015 Elsevier Interactive Patient Education  2017 [employer].

## 2021-01-14 NOTE — Progress Notes (Signed)
I connected with Stacy Mercado today by telephone and verified that I am speaking with the correct person using two identifiers. Location patient: home Location provider: work Persons participating in the virtual visit: Narcedalia Poague, Glenna Durand LPN.   I discussed the limitations, risks, security and privacy concerns of performing an evaluation and management service by telephone and the availability of in person appointments. I also discussed with the patient that there may be a patient responsible charge related to this service. The patient expressed understanding and verbally consented to this telephonic visit.    Interactive audio and video telecommunications were attempted between this provider and patient, however failed, due to patient having technical difficulties OR patient did not have access to video capability.  We continued and completed visit with audio only.     Vital signs may be patient reported or missing.  Subjective:   Stacy Mercado is a 75 y.o. female who presents for Medicare Annual (Subsequent) preventive examination.  Review of Systems     Cardiac Risk Factors include: advanced age (>47men, >41 women);diabetes mellitus;dyslipidemia;obesity (BMI >30kg/m2);sedentary lifestyle     Objective:    Today's Vitals   01/14/21 1111  Weight: 186 lb (84.4 kg)  Height: 5\' 5"  (1.651 m)  PainSc: 5    Body mass index is 30.95 kg/m.  Advanced Directives 01/14/2021 11/08/2020 11/02/2020 10/27/2020 10/25/2020 01/07/2020 03/18/2018  Does Patient Have a Medical Advance Directive? No No No No No No No  Does patient want to make changes to medical advance directive? - - - - - Yes (MAU/Ambulatory/Procedural Areas - Information given) -  Would patient like information on creating a medical advance directive? - No - Patient declined - No - Patient declined - - Yes (MAU/Ambulatory/Procedural Areas - Information given)    Current Medications (verified) Outpatient Encounter  Medications as of 01/14/2021  Medication Sig  . aspirin 81 MG chewable tablet Chew 81 mg by mouth daily.   . baclofen (LIORESAL) 10 MG tablet Take 1 tablet (10 mg total) by mouth at bedtime. (Patient taking differently: Take 10 mg by mouth at bedtime as needed for muscle spasms.)  . escitalopram (LEXAPRO) 10 MG tablet TAKE 1 TABLET(10 MG) BY MOUTH DAILY  . Evolocumab (REPATHA SURECLICK) [id number] MG/ML SOAJ Inject 140 mg into the skin every 14 (fourteen) days.  Marland Kitchen gabapentin (NEURONTIN) 300 MG capsule TAKE 1 CAPSULE(300 MG) BY MOUTH AT BEDTIME  . levothyroxine (SYNTHROID) 50 MCG tablet TAKE 1 TABLET BY MOUTH DAILY BEFORE BREAKFAST (Patient taking differently: Take 50 mcg by mouth daily before breakfast.)  . losartan (COZAAR) 50 MG tablet Take 0.5 tablets (25 mg total) by mouth daily.  . metFORMIN (GLUCOPHAGE-XR) 500 MG 24 hr tablet TAKE 2 TABLETS(1000 MG) BY MOUTH IN THE MORNING AND AT BEDTIME  . mirabegron ER ([name]) 50 MG TB24 tablet Take 1 tablet (50 mg total) by mouth daily.  . montelukast (SINGULAIR) 10 MG tablet Take 1 tablet (10 mg total) by mouth at bedtime. (Patient taking differently: Take 10 mg by mouth at bedtime as needed (allergies).)  . Multiple Vitamin (MULTIVITAMIN WITH MINERALS) TABS tablet Take 1 tablet by mouth daily.  . naproxen (NAPROSYN) 500 MG tablet Take 1 tablet (500 mg total) by mouth 2 (two) times daily with a meal.  . HYDROcodone-acetaminophen (NORCO/VICODIN) 5-325 MG tablet Take 1-2 tablets by mouth every 6 (six) hours as needed for moderate pain. (Patient not taking: Reported on 01/14/2021)  . Lidocaine-Menthol (ICY HOT LIDOCAINE PLUS MENTHOL EX) Apply 1 application topically  2 (two) times daily as needed (muscle pain). (Patient not taking: Reported on 01/14/2021)   No facility-administered encounter medications on file as of 01/14/2021.    Allergies (verified) Statins   History: Past Medical History:  Diagnosis Date  . Arthritis   . Bladder cancer ([place]) 2017  .  Chronic low back pain   . Depression   . Diabetes mellitus without complication ([place])   . History of blood transfusion   . History of iron deficiency anemia   . History of pleurisy   . History of pneumonia   . Hyperlipidemia 08/07/2016  . Hypertension   . Hypothyroidism   . Migraines   . Personal history of chemotherapy    Bladder Ca  . Urge incontinence of urine 08/07/2016   Past Surgical History:  Procedure Laterality Date  . ABDOMINAL HYSTERECTOMY    . APPENDECTOMY  1961  . bladder cancer removed  2014  . BOTOX INJECTION N/A 11/08/2020   Procedure: BOTOX INJECTION;  Surgeon: Hollice Espy, MD;  Location: [facility] ORS;  Service: Urology;  Laterality: N/A;  . CATARACT EXTRACTION W/ INTRAOCULAR LENS  IMPLANT, BILATERAL    . complete hysterectomy  1999  . DILATION AND CURETTAGE OF UTERUS  1999  . IR RADIOLOGIST EVAL & MGMT  07/20/2020  . IR RADIOLOGIST EVAL & MGMT  10/19/2020  . IR RADIOLOGIST EVAL & MGMT  12/02/2020  . L rotator cuff Left 2010  . R thumb ruptured ligament and tendon  2012  . RADIOLOGY WITH ANESTHESIA Right 10/27/2020   Procedure: IR WITH ANESTHESIA RIGHT RENAL CRYOABLATION;  Surgeon: Sandi Mariscal, MD;  Location: [facility];  Service: Anesthesiology;  Laterality: Right;  . REVERSE SHOULDER ARTHROPLASTY Right 11/20/2017   Procedure: REVERSE SHOULDER ARTHROPLASTY;  Surgeon: Corky Mull, MD;  Location: [facility] ORS;  Service: Orthopedics;  Laterality: Right;  . SHOULDER ARTHROSCOPY WITH BICEPSTENOTOMY Right 01/30/2017   Procedure: SHOULDER ARTHROSCOPY WITH BICEPSTENOTOMY;  Surgeon: Corky Mull, MD;  Location: [facility] OR[facility];  Service: Orthopedics;  Laterality: Right;  biceps tenolysis  . SHOULDER ARTHROSCOPY WITH OPEN ROTATOR CUFF REPAIR Right 01/30/2017   Procedure: SHOULDER ARTHROSCOPY WITH OPEN ROTATOR CUFF REPAIR;  Surgeon: Corky Mull, MD;  Location: [facility] ORS;  Service: Orthopedics;  Laterality: Right;  Massive rotator cuff tear repair   Family History  Problem Relation Age of  Onset  . Hypertension Brother   . Diabetes Brother   . Healthy Son   . Healthy Son   . Healthy Daughter   . Kidney cancer Neg Hx   . Bladder Cancer Neg Hx   . Breast cancer Neg Hx    Social History   Socioeconomic History  . Marital status: Divorced    Spouse name: Not on file  . Number of children: Not on file  . Years of education: Not on file  . Highest education level: Not on file  Occupational History  . Occupation: retired  Tobacco Use  . Smoking status: Former Smoker    Years: 50.00    Types: Cigarettes    Quit date: 12/18/2006    Years since quitting: 14.0  . Smokeless tobacco: Never Used  Vaping Use  . Vaping Use: Never used  Substance and Sexual Activity  . Alcohol use: Yes    Alcohol/week: 7.0 standard drinks    Types: 7 Shots of liquor per week    Comment: glass of liquor nightly  . Drug use: No  . Sexual activity: Not on file  Other Topics Concern  .  Not on file  Social History Narrative  . Not on file   Social Determinants of Health   Financial Resource Strain: Low Risk   . Difficulty of Paying Living Expenses: Not hard at all  Food Insecurity: No Food Insecurity  . Worried About Charity fundraiser in the Last Year: Never true  . Ran Out of Food in the Last Year: Never true  Transportation Needs: No Transportation Needs  . Lack of Transportation (Medical): No  . Lack of Transportation (Non-Medical): No  Physical Activity: Inactive  . Days of Exercise per Week: 0 days  . Minutes of Exercise per Session: 0 min  Stress: No Stress Concern Present  . Feeling of Stress : Not at all  Social Connections: Not on file    Tobacco Counseling Counseling given: Not Answered   Clinical Intake:  Pre-visit preparation completed: Yes  Pain : 0-10 Pain Score: 5  Pain Type: Chronic pain Pain Location: Head Pain Descriptors / Indicators: Aching,Constant Pain Onset: More than a month ago Pain Frequency: Constant     Nutritional Status: BMI > 30   Obese Nutritional Risks: None Diabetes: Yes  How often do you need to have someone help you when you read instructions, pamphlets, or other written materials from your doctor or pharmacy?: 1 - Never What is the last grade level you completed in school?: 12th grade  Diabetic? Yes Nutrition Risk Assessment:  Has the patient had any N/V/D within the last 2 months?  No  Does the patient have any non-healing wounds?  No  Has the patient had any unintentional weight loss or weight gain?  No   Diabetes:  Is the patient diabetic?  Yes  If diabetic, was a CBG obtained today?  No  Did the patient bring in their glucometer from home?  No  How often do you monitor your CBG's? Does not.   Financial Strains and Diabetes Management:  Are you having any financial strains with the device, your supplies or your medication? No .  Does the patient want to be seen by Chronic Care Management for management of their diabetes?  No  Would the patient like to be referred to a Nutritionist or for Diabetic Management?  No   Diabetic Exams:  Diabetic Eye Exam: Overdue for diabetic eye exam. Pt has been advised about the importance in completing this exam. Patient advised to call and schedule an eye exam. Diabetic Foot Exam: Overdue, Pt has been advised about the importance in completing this exam. Pt is scheduled for diabetic foot exam on next appointment.   Interpreter Needed?: No  Information entered by :: NAllen LPN   Activities of Daily Living In your present state of health, do you have any difficulty performing the following activities: 01/14/2021 11/02/2020  Hearing? N N  Vision? N N  Difficulty concentrating or making decisions? N N  Walking or climbing stairs? N N  Dressing or bathing? N N  Doing errands, shopping? N N  Preparing Food and eating ? N -  Using the Toilet? N -  In the past six months, have you accidently leaked urine? Y -  Comment over active bladder -  Do you have problems  with loss of bowel control? N -  Managing your Medications? N -  Managing your Finances? N -  Housekeeping or managing your Housekeeping? N -  Some recent data might be hidden    Patient Care Team: Valerie Roys, DO as PCP - General (Family Medicine)  Vladimir Faster, [facility] as Pharmacist (Pharmacist)  Indicate any recent Medical Services you may have received from other than [facility] providers in the past year (date may be approximate).     Assessment:   This is a routine wellness examination for Anayeli.  Hearing/Vision screen  Hearing Screening   125Hz  250Hz  500Hz  1000Hz  2000Hz  3000Hz  4000Hz  6000Hz  8000Hz   Right ear:           Left ear:           Vision Screening Comments: No regular eye exams, [facility]  Dietary issues and exercise activities discussed: Current Exercise Habits: The patient does not participate in regular exercise at present  Goals    .  DIET - INCREASE WATER INTAKE      Recommend drinking at least 6-8 glasses of water a day     .  Patient Stated      01/14/2021, continue to lose weight    .  Pharmacy care plan      CARE PLAN ENTRY (see longitudinal plan of care for additional care plan information)  Current Barriers:  . Chronic Disease Management support, education, and care coordination needs related to Hypertension, Hyperlipidemia, Diabetes, Hypothyroidism, Depression, Osteopenia, and Overactive Bladder   Hypertension BP Readings from Last 3 Encounters:  07/07/20 (!) 154/79  07/06/20 134/75  05/03/20 (!) 161/69   . Pharmacist Clinical Goal(s): o Over the next 90 days, patient will work with PharmD and providers to achieve BP goal <130/80 . Current regimen:  o Losartan 50 mg daily . Interventions: o Reviewed goal BP  o Reviewed proper BP technique and encouraged patient to check at home . Patient self care activities - Over the next 90 days, patient will: o Check BP 2-3 times weekly, document, and provide at future appointments o Ensure  daily salt intake < 2300 mg/day  Hyperlipidemia Lab Results  Component Value Date/Time   LDLCALC 45 05/03/2020 03:29 PM   . Pharmacist Clinical Goal(s): o Over the next 90 days, patient will work with PharmD and providers to maintain LDL goal < 70 . Current regimen:  o Repatha 140 mg every 14 days . Interventions: o Discussed unavailability of [place] for next year o Reviewed PAP option for medication o Will  have CPA complete PAP . Patient self care activities - Over the next 90 days, patient will: o Continue to focus on healthy diet o Provide required portion of PAP documentation  Diabetes Lab Results  Component Value Date/Time   HGBA1C 6.4 07/06/2020 11:23 AM   HGBA1C 7.5 (H) 04/01/2020 10:46 AM   HGBA1C 5.3 05/24/2016 12:00 AM   HGBA1C 5.9 12/29/2015 12:00 AM   . Pharmacist Clinical Goal(s): o Over the next 90 days, patient will work with PharmD and providers to maintain A1c goal <7% . Current regimen:  o Metformin XR 1000 mg bid . Interventions: o Reviewed goal glucose readings for an A1c of <7%, we want to see fasting sugars <130 and 2 hour after meal sugars <180.  o Encouraged patient to consider checking at home o Reviewed signs and symptoms of hypoglycemia . Patient self care activities - Over the next 90 days, patient will: o Check blood sugar  at office visits o Contact provider with any episodes of hypoglycemia  Medication management . Pharmacist Clinical Goal(s): o Over the next 90 days, patient will work with PharmD and providers to achieve optimal medication adherence . Current pharmacy: [facility] . Interventions o Comprehensive medication review performed. o  Utilize [facility] pharmacy for medication synchronization, packaging and delivery . Patient self care activities - Over the next 90 days, patient will: o Focus on medication adherence by fill dates o Take medications as prescribed o Report any questions or concerns to PharmD and/or  provider(s)  Initial goal documentation     .  PharmD "My medication is expensive" (pt-stated)      CARE PLAN ENTRY (see longtitudinal plan of care for additional care plan information)  Current Barriers:  . Social, financial, or community barriers: o Patient called today noting that she was being charged $55 for Repatha, which should be $0 with the combination of [employer] and [employer] . Polypharmacy; complex patient with multiple comorbidities including HLD, PVD o HLD: Repatha 140 mg Q14 days. Last LDL well at goal <70. Continues to deny issues w/ Repatha injections.  - Received approval for [employer]. Total of $2500 over a full year o ASCVD risk reduction: ASA 81 mg daily  o T2DM: last A1c 6.4%, metformin XR 1000 mg BID o HTN: losartan 50 mg daily, last BP at goal <130/80 o Overactive bladder/hx bladder cancer: oxybutynin XL 10 mg daily; renal CT tomorrow o Depression: escitalopram 10 mg daily o Sciatica: gabapentin 600 mg QPM, baclofen 10 mg QPM PRN, naproxen 500 mg BID; orthopedic appt today, MRI for evaluation next week  Pharmacist Clinical Goal(s):  Marland Kitchen Over the next 90 days, patient will work with PharmD and provider towards optimized medication management  Interventions: . Comprehensive medication review performed, medication list updated in electronic medical record . Inter-disciplinary care team collaboration (see longitudinal plan of care) . Contacted [facility]. They were only running through [place]. They will run through the same secondary information that they used last month. Counseled patient that she may need to take the billing information from [place] back to the pharmacy. She verbalized understanding  Patient Self Care Activities:  . Patient will take medications as prescribed  Please see past updates related to this goal by clicking on the "Past Updates" button in the selected goal        Depression Screen PHQ 2/9 Scores  01/14/2021 12/28/2020 04/01/2020 01/02/2020 03/31/2019 09/17/2018 03/18/2018  PHQ - 2 Score 0 0 0 1 0 0 0  PHQ- 9 Score - 9 2 7  0 0 0    Fall Risk Fall Risk  01/14/2021 12/28/2020 04/01/2020 01/07/2020 01/02/2020  Falls in the past year? 1 0 0 0 0  Comment fell going up the stairs - - - -  Number falls in past yr: 0 - 0 0 0  Injury with Fall? 1 - 0 0 0  Comment bruising - - - -  Risk Factor Category  - - - - -  Risk for fall due to : Impaired balance/gait;Medication side effect - - - -  Follow up Falls evaluation completed;Education provided;Falls prevention discussed - - - Falls evaluation completed    FALL RISK PREVENTION PERTAINING TO THE HOME:  Any stairs in or around the home? Yes  If so, are there any without handrails? No  Home free of loose throw rugs in walkways, pet beds, electrical cords, etc? Yes  Adequate lighting in your home to reduce risk of falls? Yes   ASSISTIVE DEVICES UTILIZED TO PREVENT FALLS:  Life alert? No  Use of a cane, walker or w/c? No  Grab bars in the bathroom? No  Shower chair or bench in shower? No  Elevated toilet seat or a handicapped toilet? No  TIMED UP AND GO:  Was the test performed? No .    Cognitive Function:     6CIT Screen 01/14/2021 01/07/2020 03/18/2018 03/13/2017  What Year? 0 points 0 points 0 points 0 points  What month? 0 points 0 points 0 points 0 points  What time? 0 points 0 points 0 points 0 points  Count back from 20 0 points 0 points 0 points 0 points  Months in reverse 0 points 0 points 0 points 0 points  Repeat phrase 2 points 0 points 0 points 0 points  Total Score 2 0 0 0    Immunizations Immunization History  Administered Date(s) Administered  . Influenza, High Dose Seasonal PF 08/28/2016, 09/20/2017, 09/17/2018, 08/07/2019  . PFIZER(Purple Top)SARS-COV-2 Vaccination 01/13/2020, 02/03/2020, 09/25/2020  . Pneumococcal Conjugate-13 06/29/2015, 09/27/2016  . Pneumococcal Polysaccharide-23 08/06/2013, 10/28/2020  . Tdap  11/28/2016  . Zoster 08/06/2013    TDAP status: Up to date  Flu Vaccine status: Up to date  Pneumococcal vaccine status: Up to date  Covid-19 vaccine status: Completed vaccines  Qualifies for Shingles Vaccine? Yes   Zostavax completed Yes   Shingrix Completed?: No.    Education has been provided regarding the importance of this vaccine. Patient has been advised to call insurance company to determine out of pocket expense if they have not yet received this vaccine. Advised may also receive vaccine at local pharmacy or Health Dept. Verbalized acceptance and understanding.  Screening Tests Health Maintenance  Topic Date Due  . OPHTHALMOLOGY EXAM  09/12/2017  . FOOT EXAM  03/19/2019  . COVID-19 Vaccine (4 - Booster for Pfizer series) 03/26/2021  . HEMOGLOBIN A1C  07/11/2021  . TETANUS/TDAP  11/28/2026  . INFLUENZA VACCINE  Completed  . DEXA SCAN  Completed  . Hepatitis C Screening  Completed  . PNA vac Low Risk Adult  Completed    Health Maintenance  Health Maintenance Due  Topic Date Due  . OPHTHALMOLOGY EXAM  09/12/2017  . FOOT EXAM  03/19/2019    Colorectal cancer screening: No longer required.   Mammogram status: patient to schedule  Bone Density status: Completed 06/12/2018.   Lung Cancer Screening: (Low Dose CT Chest recommended if Age 47-80 years, 30 pack-year currently smoking OR have quit w/in 15years.) does not qualify.   Lung Cancer Screening Referral: no  Additional Screening:  Hepatitis C Screening: does qualify; Completed 08/14/2016  Vision Screening: Recommended annual ophthalmology exams for early detection of glaucoma and other disorders of the eye. Is the patient up to date with their annual eye exam?  No  Who is the provider or what is the name of the office in which the patient attends annual eye exams? [facility] If pt is not established with a provider, would they like to be referred to a provider to establish care? No .   Dental  Screening: Recommended annual dental exams for proper oral hygiene  Community Resource Referral / Chronic Care Management: CRR required this visit?  No   CCM required this visit?  No      Plan:     I have personally reviewed and noted the following in the patient's chart:   . Medical and social history . Use of alcohol, tobacco or illicit drugs  . Current medications and supplements . Functional ability and status . Nutritional status . Physical activity . Advanced directives . List of other physicians . Hospitalizations, surgeries, and ER visits in previous 12 months . Vitals . Screenings to include cognitive, depression,  and falls . Referrals and appointments  In addition, I have reviewed and discussed with patient certain preventive protocols, quality metrics, and best practice recommendations. A written personalized care plan for preventive services as well as general preventive health recommendations were provided to patient.     Kellie Simmering, LPN   [id number]   Nurse Notes:

## 2021-01-16 MED ORDER — ALPRAZOLAM 0.5 MG PO TABS: 0.5000 mg | ORAL_TABLET | Freq: Once | ORAL | 0 refills | Status: AC

## 2021-01-16 MED ORDER — GABAPENTIN 300 MG PO CAPS
ORAL_CAPSULE | ORAL | 1 refills | Status: DC
Start: 2021-01-16 — End: 2021-07-04

## 2021-01-16 MED ORDER — LOSARTAN POTASSIUM 25 MG PO TABS
25.0000 mg | ORAL_TABLET | Freq: Every day | ORAL | 1 refills | Status: DC
Start: 2021-01-16 — End: 2021-07-03

## 2021-01-16 MED ORDER — NAPROXEN 500 MG PO TABS
500.0000 mg | ORAL_TABLET | Freq: Two times a day (BID) | ORAL | 1 refills | Status: DC
Start: 2021-01-16 — End: 2021-07-03

## 2021-01-16 MED ORDER — REPATHA SURECLICK 140 MG/ML ~~LOC~~ SOAJ: 140.0000 mg | SUBCUTANEOUS | 6 refills | Status: DC

## 2021-01-16 MED ORDER — ESCITALOPRAM OXALATE 10 MG PO TABS
10.0000 mg | ORAL_TABLET | Freq: Every day | ORAL | 1 refills | Status: DC
Start: 2021-01-16 — End: 2021-07-04

## 2021-01-16 MED ORDER — METFORMIN HCL ER 500 MG PO TB24
ORAL_TABLET | ORAL | 1 refills | Status: DC
Start: 2021-01-16 — End: 2021-07-04

## 2021-01-16 NOTE — Assessment & Plan Note (Signed)
Stable for 6 weeks without any improvement. No sign of vertigo. History of bladder and renal cell carcinoma. Concern for mass, met or stroke. Will obtain MRI w and w/o. Await results. Will refer to ENT for further evaluation- may also need to see neurology. Call with any concerns.

## 2021-01-16 NOTE — Assessment & Plan Note (Signed)
Following with urology. Continue to monitor. Call with any concerns.  

## 2021-01-16 NOTE — Assessment & Plan Note (Signed)
Under good control on current regimen. Continue current regimen. Continue to monitor. Call with any concerns. Refills given. Labs drawn today.   

## 2021-01-16 NOTE — Assessment & Plan Note (Signed)
Doing well with a1c of 6.3. Continue current regimen. Continue to monitor. Call with any concerns.

## 2021-01-19 ENCOUNTER — Telehealth: Payer: Self-pay | Admitting: Pharmacist

## 2021-01-20 ENCOUNTER — Telehealth: Payer: Self-pay | Admitting: Pharmacist

## 2021-01-20 NOTE — Progress Notes (Addendum)
Chronic Care Management Pharmacy Assistant   Name: Stacy Mercado  MRN: [id number] DOB: [birth year]  Reason for Encounter: Patient Assistance Application [place]    PCP : Valerie Roys, DO  Allergies:   Allergies  Allergen Reactions   Statins Other (See Comments)    myalgia    Medications: Outpatient Encounter Medications as of 01/19/2021  Medication Sig   aspirin 81 MG chewable tablet Chew 81 mg by mouth daily.    baclofen (LIORESAL) 10 MG tablet Take 1 tablet (10 mg total) by mouth at bedtime. (Patient taking differently: Take 10 mg by mouth at bedtime as needed for muscle spasms.)   escitalopram (LEXAPRO) 10 MG tablet Take 1 tablet (10 mg total) by mouth daily.   Evolocumab (REPATHA SURECLICK) 381 MG/ML SOAJ Inject 140 mg into the skin every 14 (fourteen) days.   gabapentin (NEURONTIN) 300 MG capsule TAKE 1 CAPSULE(300 MG) BY MOUTH AT BEDTIME   HYDROcodone-acetaminophen (NORCO/VICODIN) 5-325 MG tablet Take 1-2 tablets by mouth every 6 (six) hours as needed for moderate pain. (Patient not taking: Reported on 01/14/2021)   levothyroxine (SYNTHROID) 50 MCG tablet TAKE 1 TABLET BY MOUTH DAILY BEFORE BREAKFAST (Patient taking differently: Take 50 mcg by mouth daily before breakfast.)   Lidocaine-Menthol (ICY HOT LIDOCAINE PLUS MENTHOL EX) Apply 1 application topically 2 (two) times daily as needed (muscle pain). (Patient not taking: Reported on 01/14/2021)   losartan (COZAAR) 25 MG tablet Take 1 tablet (25 mg total) by mouth daily.   metFORMIN (GLUCOPHAGE-XR) 500 MG 24 hr tablet TAKE 2 TABLETS(1000 MG) BY MOUTH IN THE MORNING AND AT BEDTIME   mirabegron ER ([name]) 50 MG TB24 tablet Take 1 tablet (50 mg total) by mouth daily.   montelukast (SINGULAIR) 10 MG tablet Take 1 tablet (10 mg total) by mouth at bedtime. (Patient taking differently: Take 10 mg by mouth at bedtime as needed (allergies).)   Multiple Vitamin (MULTIVITAMIN WITH MINERALS) TABS tablet Take 1 tablet by mouth  daily.   naproxen (NAPROSYN) 500 MG tablet Take 1 tablet (500 mg total) by mouth 2 (two) times daily with a meal.   No facility-administered encounter medications on file as of 01/19/2021.    Current Diagnosis: Patient Active Problem List   Diagnosis Date Noted   Dizziness 12/28/2020   Renal cell carcinoma of right kidney ([place]) 10/27/2020   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/22/2020   Chronic bilateral low back pain with left-sided sciatica 01/75/1025   Lichen sclerosus 85/27/7824   Vulvar irritation 04/09/2019   Osteopenia 06/14/2018   Hypothyroidism 04/26/2018   Status post reverse total shoulder replacement, 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   Complex tear of lateral meniscus of right knee as current injury 08/03/2017   Primary osteoarthritis of right knee 08/03/2017   Advance directive discussed with patient 03/13/2017   Cataracts, bilateral 08/14/2016   Rotator cuff tendinitis, right 08/14/2016   Right kidney mass 08/07/2016   Hyperlipidemia 08/07/2016   Urge incontinence of urine 08/07/2016   Spondylosis of cervical spine 08/07/2016   Bladder cancer ([place])    Controlled type 2 diabetes mellitus ([place])    Depression    Benign hypertensive renal disease    PVD (peripheral vascular disease) ([place])     01-19-2021: Patient assistance application was faxed to [employer] of [place] on 12-21-20. Patient inquired about the status of her application. No follow up information has been sent to the patient or PCP office. [place] where the recording states "  due to incluement weather we are experiencing excessive hold times." Selected option 1 for a same day call back.   01-20-2021: [employer] did call back but after business hours. Left a message requesting a call back at 7:30 pm on 01-19-2021. Made PharmD Birdena Crandall aware of the late call back. Called [employer] back again with the same recording stating; "" due to incluement weather we are  experiencing excessive hold times." Again I selected option 1 for a same day call back.   01-21-2021: Spoke with call representative with [place]. Per representative the patient's application was in "pending status due to the application not being legible".   Made PharmD Birdena Crandall aware of the above and faxed the application again per [place].   Cloretta Ned, LPN Clinical Pharmacist Assistant  [phone]    Follow-Up:  Pharmacist Review I have reviewed the care management and care coordination activities outlined in this encounter and I am certifying that I agree with the content of this note.  Application has been refaxed to [place] with requested government eligibility information verified by patient. Patient is aware of process.  Junita Push. Kenton Kingfisher PharmD, [facility] [phone]

## 2021-01-25 ENCOUNTER — Ambulatory Visit
Admission: RE | Admit: 2021-01-25 | Discharge: 2021-01-25 | Disposition: A | Discharge status: Home / Self Care | Payer: Medicare Other | Source: Ambulatory Visit | Attending: Family Medicine | Admitting: Family Medicine

## 2021-01-25 ENCOUNTER — Other Ambulatory Visit: Payer: Self-pay

## 2021-01-25 DIAGNOSIS — I6782 Cerebral ischemia: Secondary | ICD-10-CM | POA: Diagnosis not present

## 2021-01-25 DIAGNOSIS — R519 Headache, unspecified: Secondary | ICD-10-CM | POA: Insufficient documentation

## 2021-01-25 DIAGNOSIS — Z85528 Personal history of other malignant neoplasm of kidney: Secondary | ICD-10-CM | POA: Diagnosis not present

## 2021-01-25 DIAGNOSIS — R42 Dizziness and giddiness: Secondary | ICD-10-CM | POA: Diagnosis not present

## 2021-01-25 DIAGNOSIS — I616 Nontraumatic intracerebral hemorrhage, multiple localized: Secondary | ICD-10-CM | POA: Diagnosis not present

## 2021-01-25 DIAGNOSIS — C641 Malignant neoplasm of right kidney, except renal pelvis: Secondary | ICD-10-CM | POA: Insufficient documentation

## 2021-01-25 DIAGNOSIS — G8929 Other chronic pain: Secondary | ICD-10-CM | POA: Diagnosis not present

## 2021-01-25 MED ORDER — GADOBUTROL 1 MMOL/ML IV SOLN
8.0000 mL | Freq: Once | INTRAVENOUS | Status: AC | PRN
  Administered 2021-01-25: 8 mL via INTRAVENOUS

## 2021-01-26 ENCOUNTER — Other Ambulatory Visit: Payer: Self-pay | Admitting: Family Medicine

## 2021-01-26 DIAGNOSIS — R42 Dizziness and giddiness: Secondary | ICD-10-CM

## 2021-01-27 ENCOUNTER — Encounter: Payer: Self-pay | Admitting: Family Medicine

## 2021-01-27 ENCOUNTER — Ambulatory Visit (INDEPENDENT_AMBULATORY_CARE_PROVIDER_SITE_OTHER): Payer: Medicare Other | Admitting: Family Medicine

## 2021-01-27 ENCOUNTER — Other Ambulatory Visit: Payer: Self-pay

## 2021-01-27 VITALS — BP 107/66 | HR 76 | Temp 97.7°F | Wt 185.8 lb

## 2021-01-27 DIAGNOSIS — I6930 Unspecified sequelae of cerebral infarction: Secondary | ICD-10-CM

## 2021-01-27 DIAGNOSIS — R42 Dizziness and giddiness: Secondary | ICD-10-CM | POA: Diagnosis not present

## 2021-01-27 NOTE — Patient Instructions (Addendum)
Dr. Melrose Nakayama- neurologist [phone]

## 2021-01-27 NOTE — Progress Notes (Signed)
BP 107/66   Pulse 76   Temp 97.7 F (36.5 C) (Oral)   Wt 185 lb 12.8 oz (84.3 kg)   SpO2 95%   BMI 30.92 kg/m    Subjective:    Patient ID: Stacy Mercado, female    DOB: [birth year], 75 y.o.   MRN: [id number]  HPI: Stacy Mercado is a 75 y.o. female  Chief Complaint  Patient presents with  . Dizziness    Pt states she is constantly dizzy and states it gets better when she sits down.    DIZZINESS Duration: about 2 month Description of symptoms: lightheaded Duration of episode: constant Dizziness frequency: recurrent, constant Provoking factors: constatn Triggered by rolling over in bed: yes Triggered by bending over: no Aggravated by head movement: no Aggravated by exertion, coughing, loud noises: no Recent head injury: no Recent or current viral symptoms: no History of vasovagal episodes: no Nausea: no Vomiting: no Tinnitus: no Hearing loss: no Aural fullness: no Headache: yes Photophobia/phonophobia: no Unsteady gait: yes Postural instability: yes Diplopia, dysarthria, dysphagia or weakness: no Related to exertion: no Pallor: yes Diaphoresis: no Dyspnea: no Chest pain: no  Relevant past medical, surgical, family and social history reviewed and updated as indicated. Interim medical history since our last visit reviewed. Allergies and medications reviewed and updated.  Review of Systems  Constitutional: Negative.   Respiratory: Negative.   Cardiovascular: Negative.   Gastrointestinal: Negative.   Musculoskeletal: Negative.   Neurological: Positive for dizziness and light-headedness. Negative for tremors, seizures, syncope, facial asymmetry, speech difficulty, weakness, numbness and headaches.  Psychiatric/Behavioral: Negative.     Per HPI unless specifically indicated above     Objective:    BP 107/66   Pulse 76   Temp 97.7 F (36.5 C) (Oral)   Wt 185 lb 12.8 oz (84.3 kg)   SpO2 95%   BMI 30.92 kg/m   Wt Readings from Last 3  Encounters:  01/27/21 185 lb 12.8 oz (84.3 kg)  01/14/21 186 lb (84.4 kg)  01/11/21 186 lb 12.8 oz (84.7 kg)    Physical Exam Vitals and nursing note reviewed.  Constitutional:      General: She is not in acute distress.    Appearance: Normal appearance. She is not ill-appearing, toxic-appearing or diaphoretic.  HENT:     Head: Normocephalic and atraumatic.     Right Ear: External ear normal.     Left Ear: External ear normal.     Nose: Nose normal.     Mouth/Throat:     Mouth: Mucous membranes are moist.     Pharynx: Oropharynx is clear.  Eyes:     General: No scleral icterus.       Right eye: No discharge.        Left eye: No discharge.     Extraocular Movements: Extraocular movements intact.     Conjunctiva/sclera: Conjunctivae normal.     Pupils: Pupils are equal, round, and reactive to light.  Cardiovascular:     Rate and Rhythm: Normal rate and regular rhythm.     Pulses: Normal pulses.     Heart sounds: Normal heart sounds. No murmur heard. No friction rub. No gallop.   Pulmonary:     Effort: Pulmonary effort is normal. No respiratory distress.     Breath sounds: Normal breath sounds. No stridor. No wheezing, rhonchi or rales.  Chest:     Chest wall: No tenderness.  Musculoskeletal:        General: Normal range of  motion.     Cervical back: Normal range of motion and neck supple.  Skin:    General: Skin is warm and dry.     Capillary Refill: Capillary refill takes less than 2 seconds.     Coloration: Skin is not jaundiced or pale.     Findings: No bruising, erythema, lesion or rash.  Neurological:     General: No focal deficit present.     Mental Status: She is alert and oriented to person, place, and time. Mental status is at baseline.  Psychiatric:        Mood and Affect: Mood normal.        Behavior: Behavior normal.        Thought Content: Thought content normal.        Judgment: Judgment normal.     Results for orders placed or performed in visit on  01/11/21  Comprehensive metabolic panel  Result Value Ref Range   Glucose 167 (H) 65 - 99 mg/dL   BUN 14 8 - 27 mg/dL   Creatinine, Ser 0.79 0.57 - 1.00 mg/dL   GFR calc non Af Amer 73 >59 mL/min/1.73   GFR calc Af Amer 84 >59 mL/min/1.73   BUN/Creatinine Ratio 18 12 - 28   Sodium 144 134 - 144 mmol/L   Potassium 4.6 3.5 - 5.2 mmol/L   Chloride 103 96 - 106 mmol/L   CO2 26 20 - 29 mmol/L   Calcium 10.5 (H) 8.7 - 10.3 mg/dL   Total Protein 6.3 6.0 - 8.5 g/dL   Albumin 4.1 3.7 - 4.7 g/dL   Globulin, Total 2.2 1.5 - 4.5 g/dL   Albumin/Globulin Ratio 1.9 1.2 - 2.2   Bilirubin Total 0.5 0.0 - 1.2 mg/dL   Alkaline Phosphatase 69 44 - 121 IU/L   AST 22 0 - 40 IU/L   ALT 23 0 - 32 IU/L  Bayer DCA Hb A1c Waived  Result Value Ref Range   HB A1C (BAYER DCA - WAIVED) 6.3 <7.0 %  Lipid Panel w/o Chol/HDL Ratio  Result Value Ref Range   Cholesterol, Total 124 100 - 199 mg/dL   Triglycerides 146 0 - 149 mg/dL   HDL 42 >39 mg/dL   VLDL Cholesterol Cal 25 5 - 40 mg/dL   LDL Chol Calc (NIH) 57 0 - 99 mg/dL      Assessment & Plan:   Problem List Items Addressed This Visit      Other   Dizziness - Primary    No change. MRI showed previous centrum semiovale stroke, otherwise normal. Will get her into neurology and ENT for evaluation. Call with any concerns.       Other Visit Diagnoses    History of CVA with residual deficit       Found on MRI- will get her into neurology for evaluation.        Follow up plan: Return July.

## 2021-01-28 ENCOUNTER — Other Ambulatory Visit: Payer: Self-pay | Admitting: Family Medicine

## 2021-01-28 DIAGNOSIS — Z1231 Encounter for screening mammogram for malignant neoplasm of breast: Secondary | ICD-10-CM

## 2021-01-30 ENCOUNTER — Encounter: Payer: Self-pay | Admitting: Family Medicine

## 2021-01-30 NOTE — Assessment & Plan Note (Signed)
No change. MRI showed previous centrum semiovale stroke, otherwise normal. Will get her into neurology and ENT for evaluation. Call with any concerns.

## 2021-02-04 ENCOUNTER — Telehealth: Payer: Self-pay | Admitting: Pharmacist

## 2021-02-08 ENCOUNTER — Other Ambulatory Visit: Payer: Self-pay | Admitting: Interventional Radiology

## 2021-02-08 DIAGNOSIS — N2889 Other specified disorders of kidney and ureter: Secondary | ICD-10-CM

## 2021-02-08 NOTE — Progress Notes (Signed)
Chronic Care Management Pharmacy Assistant   Name: Stacy Mercado  MRN: [id number] DOB: 04/18/1946  Reason for Encounter: Follow Up Patient Assistance for [place]    PCP : Valerie Roys, DO  Allergies:   Allergies  Allergen Reactions  . Statins Other (See Comments)    myalgia    Medications: Outpatient Encounter Medications as of 02/04/2021  Medication Sig  . ALPRAZolam (XANAX) 0.5 MG tablet Take 0.5 mg by mouth once.  Marland Kitchen aspirin 81 MG chewable tablet Chew 81 mg by mouth daily.   . baclofen (LIORESAL) 10 MG tablet Take 1 tablet (10 mg total) by mouth at bedtime. (Patient taking differently: Take 10 mg by mouth at bedtime as needed for muscle spasms.)  . escitalopram (LEXAPRO) 10 MG tablet Take 1 tablet (10 mg total) by mouth daily.  . Evolocumab (REPATHA SURECLICK) 993 MG/ML SOAJ Inject 140 mg into the skin every 14 (fourteen) days.  Marland Kitchen gabapentin (NEURONTIN) 300 MG capsule TAKE 1 CAPSULE(300 MG) BY MOUTH AT BEDTIME  . HYDROcodone-acetaminophen (NORCO/VICODIN) 5-325 MG tablet Take 1-2 tablets by mouth every 6 (six) hours as needed for moderate pain.  Marland Kitchen levothyroxine (SYNTHROID) 50 MCG tablet TAKE 1 TABLET BY MOUTH DAILY BEFORE BREAKFAST (Patient taking differently: Take 50 mcg by mouth daily before breakfast.)  . Lidocaine-Menthol (ICY HOT LIDOCAINE PLUS MENTHOL EX) Apply 1 application topically 2 (two) times daily as needed (muscle pain).  Marland Kitchen losartan (COZAAR) 25 MG tablet Take 1 tablet (25 mg total) by mouth daily.  . metFORMIN (GLUCOPHAGE-XR) 500 MG 24 hr tablet TAKE 2 TABLETS(1000 MG) BY MOUTH IN THE MORNING AND AT BEDTIME  . mirabegron ER ([name]) 50 MG TB24 tablet Take 1 tablet (50 mg total) by mouth daily.  . montelukast (SINGULAIR) 10 MG tablet Take 1 tablet (10 mg total) by mouth at bedtime. (Patient taking differently: Take 10 mg by mouth at bedtime as needed (allergies).)  . Multiple Vitamin (MULTIVITAMIN WITH MINERALS) TABS tablet Take 1 tablet by mouth daily.   . naproxen (NAPROSYN) 500 MG tablet Take 1 tablet (500 mg total) by mouth 2 (two) times daily with a meal.   No facility-administered encounter medications on file as of 02/04/2021.    Current Diagnosis: Patient Active Problem List   Diagnosis Date Noted  . Dizziness 12/28/2020  . Renal cell carcinoma of right kidney ([place]) 10/27/2020  . 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/22/2020  . Chronic bilateral low back pain with left-sided sciatica 07/13/2020  . Lichen sclerosus 57/12/7791  . Vulvar irritation 04/09/2019  . Osteopenia 06/14/2018  . Hypothyroidism 04/26/2018  . 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  . Complex tear of lateral meniscus of right knee as current injury 08/03/2017  . Primary osteoarthritis of right knee 08/03/2017  . Advance directive discussed with patient 03/13/2017  . Cataracts, bilateral 08/14/2016  . Rotator cuff tendinitis, right 08/14/2016  . Right kidney mass 08/07/2016  . Hyperlipidemia 08/07/2016  . Urge incontinence of urine 08/07/2016  . 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 Bladder cancer ([place])   . Controlled type 2 diabetes mellitus ([place])   . Depression   . Benign hypertensive renal disease   . PVD (peripheral vascular disease) ([place])    02-02-2021: Patient called requesting a follow up on her patient assistance application for [place]. She reported that she attempted several times to speak with someone, but the hold times were "ridiculious". I explained to the patient I follow up on her  application and reach back out to her once I had some new information. The patient verbalized understanding.   02-02-2021: Called [employer] where I was told the patient's application was not legible. I explained that we faxed the patient's application on [phone] due to the application not being legible. The representative confirm they did receive the application, but the application was under "pending"  status. She continued to explain that [employer] will reach back out to the PCP office via fax if any other information was needed.   02-04-2021: [employer] sent a fax to the PCP office requesting more information. Requested information was sent back to [employer] as requested. Called the patient and explained the above. I also explained I would follow back up with [employer] the following week. She verbalized an understanding.   02-11-2021: Several days and on going attempts to follow up on the status of the patient's assistance application with [employer]. Representative today explained they have received all the requested information and the application is being reviewed. Again I stressed the patient has been out of medications for about a month now. Representative verbalized understanding to all information and stated she would mark the patient's account as urgent. Made PharmD Birdena Crandall aware of phone conversation.   Cloretta Ned, LPN Clinical Pharmacist Assistant  [phone]    Follow-Up:  Pharmacist Review

## 2021-02-09 DIAGNOSIS — R42 Dizziness and giddiness: Secondary | ICD-10-CM | POA: Diagnosis not present

## 2021-02-09 DIAGNOSIS — H9041 Sensorineural hearing loss, unilateral, right ear, with unrestricted hearing on the contralateral side: Secondary | ICD-10-CM | POA: Diagnosis not present

## 2021-02-09 DIAGNOSIS — H90A21 Sensorineural hearing loss, unilateral, right ear, with restricted hearing on the contralateral side: Secondary | ICD-10-CM | POA: Diagnosis not present

## 2021-02-09 DIAGNOSIS — H9209 Otalgia, unspecified ear: Secondary | ICD-10-CM | POA: Diagnosis not present

## 2021-02-10 ENCOUNTER — Other Ambulatory Visit: Payer: Self-pay

## 2021-02-10 ENCOUNTER — Telehealth (INDEPENDENT_AMBULATORY_CARE_PROVIDER_SITE_OTHER): Payer: Medicare Other | Admitting: Physician Assistant

## 2021-02-10 DIAGNOSIS — N3281 Overactive bladder: Secondary | ICD-10-CM | POA: Diagnosis not present

## 2021-02-10 NOTE — Progress Notes (Signed)
Virtual Visit via Telephone Note  I connected with Stacy Mercado on 02/10/21 at  3:00 PM EST by telephone and verified that I am speaking with the correct person using two identifiers.  Location: Patient: Home in [place], [place] Provider: Clinic in [place], [place]   I discussed the limitations, risks, security and privacy concerns of performing an evaluation and management service by telephone and the availability of in person appointments. I also discussed with the patient that there may be a patient responsible charge related to this service. The patient expressed understanding and agreed to proceed.  History of Present Illness: 75 year old female with PMH bladder cancer s/p BCG, right renal mass s/p cryoablation, and refractory OAB s/p intravesical Botox in November 2021 also on [name] who presents today for symptom recheck on [name] 50 mg daily.  Today she reports symptomatic improvement on an increased dose of [name].  She is most pleased with the improvement in her nocturia, now x1, previously x3.  She believes she is emptying her bladder well and denies dysuria.  She voids approximately every 2 hours during the daytime and describes her daytime frequency is "controlled.".  She wishes to to continue the increased dose of [name], however she notes that this medication remains quite expensive at over $200 per month even with insurance coverage.    Observations/Objective: Asking appropriate questions, in no apparent distress.  Assessment and Plan: 1. OAB (overactive bladder) Symptoms greatly improved with increased dose of [name] 50 mg daily combined with intravesical Botox.  We will plan to continue this.  I will send the patient cost savings program information and provide her samples in the interim.  Unfortunately, she has failed multiple anticholinergics and her insurance does not reimburse [place] [name] remains her only viable option for pharmacotherapy at this  point.  Follow Up Instructions: Return in about 3 months (around 05/10/2021) for Botox retreatment planning.    I discussed the assessment and treatment plan with the patient. The patient was provided an opportunity to ask questions and all were answered. The patient agreed with the plan and demonstrated an understanding of the instructions.   The patient was advised to call back or seek an in-person evaluation if the symptoms worsen or if the condition fails to improve as anticipated.  I provided 14 minutes of non-face-to-face time during this encounter.  Debroah Loop, PA-C

## 2021-02-15 ENCOUNTER — Other Ambulatory Visit: Payer: Self-pay

## 2021-02-15 ENCOUNTER — Ambulatory Visit
Admission: RE | Admit: 2021-02-15 | Discharge: 2021-02-15 | Disposition: A | Discharge status: Home / Self Care | Payer: Medicare Other | Source: Ambulatory Visit | Attending: Family Medicine | Admitting: Family Medicine

## 2021-02-15 DIAGNOSIS — Z1231 Encounter for screening mammogram for malignant neoplasm of breast: Secondary | ICD-10-CM | POA: Diagnosis not present

## 2021-02-17 DIAGNOSIS — R42 Dizziness and giddiness: Secondary | ICD-10-CM | POA: Diagnosis not present

## 2021-02-21 ENCOUNTER — Encounter: Payer: Self-pay | Admitting: Family Medicine

## 2021-02-23 ENCOUNTER — Encounter: Payer: Self-pay | Admitting: Urology

## 2021-02-23 ENCOUNTER — Other Ambulatory Visit: Payer: Self-pay

## 2021-02-23 ENCOUNTER — Ambulatory Visit (INDEPENDENT_AMBULATORY_CARE_PROVIDER_SITE_OTHER): Payer: Medicare Other | Admitting: Urology

## 2021-02-23 VITALS — BP 130/76 | HR 76 | Ht 65.0 in | Wt 185.0 lb

## 2021-02-23 DIAGNOSIS — Z8551 Personal history of malignant neoplasm of bladder: Secondary | ICD-10-CM | POA: Diagnosis not present

## 2021-02-23 NOTE — Progress Notes (Signed)
03/05/20 2:02 PM   Stacy Mercado 05-Jun-1946 [id number]  Referring provider: Valerie Roys, DO [place],  [place] [address]  Chief Complaint  Patient presents with  . Cysto    HPI: Stacy Mercado is a 75 y.o. who returns to the office today for cystoscopy for bladder cancer surveillance.  History of bladder cancer Records review, it appears that she was diagnosed with bladder tumors (low grade superficial) in October 2015 abd  March 2016. She underwent induction BCG completed in March 2016. as well as maintenance with cytology and was followed with cystoscopy every 3 months.  Her last BCG maintenance BCG was in 11/2015.     Right renal mass  In terms of the renal mass, it is solid in appearance based on the description from review of her records. It did not changed dramatically in size from 2015 to 2016, stable around 15 mm this is been followed conservatively.  CT abdomen and pelvis from 12/20/2016 with and without contrast shows a 2.1 cm enhancing left lateral interpolar renal mass. There is no evidence of lymphadenopathy.  Most recent ultrasound on 03/02/20 showed enlarging exophytic renal lesion.   Elected cryoablation which she underwent in 10/2020 which was uncomplicated.  Surgical pathology consistent with oncocytoma.  OAB/ urinary incontinence Baseline urgency/ urge incontinence.  She's tried Toviaz, Ditropan, and Mybetriq. She reports of transitioning to Oxybutynin for financial reasons from [name]. Overall, Mybetriq works best. She has issues with side effects from anticholinergics primarily worsened constipation.   More recently, she underwent Botox injection.  She reports today that this is helped her significantly but she has had to stay on the [name] 50 mg in addition to Botox.  This combination is the most effective so far.  She does worry about the cost of [name].  She does have baseline mild SUI with leakage with left coughing and sneezing  but minimal bother from this.  She is a diabetic, well controlled.    History of TAH '99  Blood pressure 130/76, pulse 76, height 5\' 5"  (1.651 m), weight 185 lb (83.9 kg). NED. A&Ox3.   No respiratory distress   Abd soft, NT, ND Normal external genitalia with patent urethral meatus.  Anterior bladder prolapse into the vagina appreciated.  Cystoscopy Procedure Note  Patient identification was confirmed, informed consent was obtained, and patient was prepped using Betadine solution.  Lidocaine jelly was administered per urethral meatus.    Procedure: - Flexible cystoscope introduced, without any difficulty.   - Thorough search of the bladder revealed:    normal urethral meatus    normal urothelium multiple stellate scars    no stones    no ulcers     no tumors    no urethral polyps    no trabeculation  - Ureteral orifices were normal in position and appearance.  Post-Procedure: - Patient tolerated the procedure well   Assessment & Plan:    1. Mixed stress and urge urinary incontinence Currently on Botox as well as [name] 50 mg Last injection 11/22 2021, discussed proceeding with in office injection at her 92-month interval  Unable to qualify for any assistance program, will continue to supply her as best possible with samples  2. History of bladder cancer History of low-grade superficial bladder cancer 2 Last recurrence 02/2015 NED today on cysto She would like to continue annual cystoscopy but given that she is now having Botox treatments, we can look in her bladder at the same time  3. Renal mass Status post cryoablation-pathology consistent with oncocytoma Schedule for surveillance CT on 03/11/2021-   [facility] [address], [place], [place] [address] [phone]   Hollice Espy, MD

## 2021-02-25 DIAGNOSIS — H8191 Unspecified disorder of vestibular function, right ear: Secondary | ICD-10-CM | POA: Diagnosis not present

## 2021-02-25 LAB — MICROSCOPIC EXAMINATION: Bacteria, UA: NONE SEEN

## 2021-02-25 LAB — URINALYSIS, COMPLETE
Bilirubin, UA: NEGATIVE
Glucose, UA: NEGATIVE
Ketones, UA: NEGATIVE
Leukocytes,UA: NEGATIVE
Nitrite, UA: NEGATIVE
Protein,UA: NEGATIVE
RBC, UA: NEGATIVE
Specific Gravity, UA: 1.02 (ref 1.005–1.030)
Urobilinogen, Ur: 0.2 mg/dL (ref 0.2–1.0)
pH, UA: 5.5 (ref 5.0–7.5)

## 2021-03-01 ENCOUNTER — Other Ambulatory Visit: Payer: Self-pay | Admitting: Family Medicine

## 2021-03-01 NOTE — Telephone Encounter (Signed)
Patient was last seen in office on 01/27/21 and upcoming appointment in 06/27/21

## 2021-03-01 NOTE — Telephone Encounter (Signed)
Requested medication (s) are due for refill today: yes  Requested medication (s) are on the active medication list: yes  Last refill:  11/30/2020  Future visit scheduled: yes  Notes to clinic:  TSH needs to be rechecked within 3 months after an abnormal result. Refill until TSH is due Please advise for refill as labs are needed    Requested Prescriptions  Pending Prescriptions Disp Refills   levothyroxine (SYNTHROID) 50 MCG tablet [Pharmacy Med Name: LEVOTHYROXINE 0.05MG  (50MCG) TAB] 90 tablet 2    Sig: TAKE 1 TABLET BY MOUTH DAILY BEFORE BREAKFAST      Endocrinology:  Hypothyroid Agents Failed - 03/01/2021 10:58 AM      Failed - TSH needs to be rechecked within 3 months after an abnormal result. Refill until TSH is due.      Failed - TSH in normal range and within 360 days    TSH  Date Value Ref Range Status  01/02/2020 3.900 0.450 - 4.500 uIU/mL Final          Passed - Valid encounter within last 12 months    Recent Outpatient Visits           1 month ago Dizziness   [place], Megan P, DO   1 month ago Controlled type 2 diabetes mellitus without complication, without long-term current use of insulin ([place])   [place], Triplett, DO   2 months ago Dizziness   [place] Rory Percy M, DO   7 months ago Controlled type 2 diabetes mellitus without complication, without long-term current use of insulin ([place])   [place], Megan P, DO   10 months ago Benign hypertensive renal disease   [facility] Johnson, Megan P, DO       Future Appointments             In 2 weeks  [place], [facility]   In 3 months Johnson, Barb Merino, DO [place], [place]   In 10 months  [facility] [employer], [facility]

## 2021-03-03 DIAGNOSIS — Z8673 Personal history of transient ischemic attack (TIA), and cerebral infarction without residual deficits: Secondary | ICD-10-CM | POA: Diagnosis not present

## 2021-03-03 DIAGNOSIS — R262 Difficulty in walking, not elsewhere classified: Secondary | ICD-10-CM | POA: Diagnosis not present

## 2021-03-03 DIAGNOSIS — R42 Dizziness and giddiness: Secondary | ICD-10-CM | POA: Diagnosis not present

## 2021-03-07 ENCOUNTER — Other Ambulatory Visit: Payer: Self-pay

## 2021-03-07 ENCOUNTER — Ambulatory Visit: Payer: Medicare Other | Attending: Otolaryngology | Admitting: Physical Therapy

## 2021-03-07 DIAGNOSIS — R42 Dizziness and giddiness: Secondary | ICD-10-CM

## 2021-03-07 NOTE — Therapy (Signed)
[place] MAIN [facility] SERVICES [address] [place], [place], [address] Phone: [phone]   Fax:  [phone]  Physical Therapy Evaluation  Patient Details  Name: Stacy Mercado MRN: [id number] Date of Birth: 1946-03-21 Referring Provider (PT): Dr. Pryor Ochoa   Encounter Date: 03/07/2021   PT End of Session - 03/08/21 1907    Visit Number 1    Number of Visits 13    Date for PT Re-Evaluation 05/30/21    PT Start Time 1424    PT Stop Time 1534    PT Time Calculation (min) 70 min    Equipment Utilized During Treatment Gait belt    Activity Tolerance Patient tolerated treatment well    Behavior During Therapy [facility] for tasks assessed/performed           Past Medical History:  Diagnosis Date  . Arthritis   . Bladder cancer ([place]) 2017  . Chronic low back pain   . Depression   . Diabetes mellitus without complication ([place])   . History of blood transfusion   . History of iron deficiency anemia   . History of pleurisy   . History of pneumonia   . Hyperlipidemia 08/07/2016  . Hypertension   . Hypothyroidism   . Migraines   . Personal history of chemotherapy    Bladder Ca  . Urge incontinence of urine 08/07/2016    Past Surgical History:  Procedure Laterality Date  . ABDOMINAL HYSTERECTOMY    . APPENDECTOMY  1961  . bladder cancer removed  2014  . BOTOX INJECTION N/A 11/08/2020   Procedure: BOTOX INJECTION;  Surgeon: Hollice Espy, MD;  Location: [facility] ORS;  Service: Urology;  Laterality: N/A;  . CATARACT EXTRACTION W/ INTRAOCULAR LENS  IMPLANT, BILATERAL    . complete hysterectomy  1999  . DILATION AND CURETTAGE OF UTERUS  1999  . IR RADIOLOGIST EVAL & MGMT  07/20/2020  . IR RADIOLOGIST EVAL & MGMT  10/19/2020  . IR RADIOLOGIST EVAL & MGMT  12/02/2020  . L rotator cuff Left 2010  . R thumb ruptured ligament and tendon  2012  . RADIOLOGY WITH ANESTHESIA Right 10/27/2020   Procedure: IR WITH ANESTHESIA RIGHT RENAL CRYOABLATION;   Surgeon: Sandi Mariscal, MD;  Location: [facility] ORS;  Service: Anesthesiology;  Laterality: Right;  . REVERSE SHOULDER ARTHROPLASTY Right 11/20/2017   Procedure: REVERSE SHOULDER ARTHROPLASTY;  Surgeon: Corky Mull, MD;  Location: [facility] ORS;  Service: Orthopedics;  Laterality: Right;  . SHOULDER ARTHROSCOPY WITH BICEPSTENOTOMY Right 01/30/2017   Procedure: SHOULDER ARTHROSCOPY WITH BICEPSTENOTOMY;  Surgeon: Corky Mull, MD;  Location: [facility] ORS;  Service: Orthopedics;  Laterality: Right;  biceps tenolysis  . SHOULDER ARTHROSCOPY WITH OPEN ROTATOR CUFF REPAIR Right 01/30/2017   Procedure: SHOULDER ARTHROSCOPY WITH OPEN ROTATOR CUFF REPAIR;  Surgeon: Corky Mull, MD;  Location: [facility] ORS;  Service: Orthopedics;  Laterality: Right;  Massive rotator cuff tear repair    There were no vitals filed for this visit.    Subjective Assessment - 03/08/21 2004    Subjective Patient reports that she has had imbalance and lightheadedness sensation for months.    Pertinent History History obtained from patient report and medical history. Patient was seen at [facility] for sever right ear pain on 12/02/2020, and she began to experience dizziness/imbalance shortly afterwards. Patient was seen by Dr. Pryor Ochoa, ENT physician, on 02/09/21 and VNG testing was ordered. 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  Patient describes her dizziness  as lightheadedness and sensation of head fullness, and patient states she frequently veers when walking. Patient denies vertigo. Patient reports when she is walking and at night when she sits still more are when she feels the lightheadedness sensation. Patient reports that walking and working outside doing yard work aggravate her symptoms and nothing makes it better. Patient reports that she gets daily episodes of lightheadedness and imbalance that lasts seconds. Patient reports that one physician stated that she might have had shingles in the right ear.     Diagnostic tests VNG: 96% unilateral vestibular loss; MRI brain Feb 2022 revealed evidence of past stroke    Patient Stated Goals Patient would like to be able to garden and do yard work without dizziness/imbalance and patient would like to be able to ambulate without veering.              [facility] PT Assessment - 03/07/21 1458      Assessment   Medical Diagnosis right unilateral vestibular loss    Referring Provider (PT) Dr. Pryor Ochoa    Onset Date/Surgical Date 12/02/20    Prior Therapy no prior vestibular therapy      Precautions   Precautions Fall      Restrictions   Weight Bearing Restrictions No      Balance Screen   Has the patient fallen in the past 6 months No    Has the patient had a decrease in activity level because of a fear of falling?  No    Is the patient reluctant to leave their home because of a fear of falling?  No      Home Environment   Living Environment Private residence    Type of [place] to enter    Entrance Stairs-Number of Steps 5    Entrance Stairs-Rails Left;Right;Can reach both    [place] One level    [place] --   has a walker; unsure if SW or RW-patient states she does not use     Prior Function   Level of Independence Independent with community mobility without device    Leisure works in the yard      Cognition   Overall Cognitive Status Within Functional Limits for tasks assessed      Standardized Balance Assessment   Standardized Balance Assessment Dynamic Gait Index      Dynamic Gait Index   Level Surface Mild Impairment    Change in Gait Speed Mild Impairment    Gait with Horizontal Head Turns Mild Impairment    Gait with Vertical Head Turns Moderate Impairment    Gait and Pivot Turn Moderate Impairment    Step Over Obstacle Moderate Impairment    Step Around Obstacles Mild Impairment    Steps Mild Impairment    Total Score 13            VESTIBULAR AND BALANCE EVALUATION   HISTORY:   Subjective history of current problem: History obtained from patient report and medical history. Patient was seen at Urgent Care for sever right ear pain on 12/02/2020, and she began to experience dizziness/imbalance shortly afterwards. Patient was seen by Dr. Pryor Ochoa, ENT physician, on 02/09/21 and VNG testing was ordered. 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  Patient describes her dizziness as lightheadedness and sensation of head fullness, and patient states she frequently veers when walking. Patient denies vertigo. Patient reports when she is walking and at night when she sits still  more are when she feels the lightheadedness sensation. Patient reports that walking and working outside doing yard work, bending and looking up aggravate her symptoms and nothing makes it better. Patient reports that she gets daily episodes of lightheadedness and imbalance that lasts seconds. Patient reports that one physician stated that she might have had shingles in the right ear.    Patient would like to be able to garden and do yard work without dizziness/imbalance.   Description of dizziness: unsteadiness, lightheadedness, aural fullness Frequency: daily Duration: lasts a few seconds  Symptom nature: motion provoked, intermittent  Progression of symptoms: the ear pain is better, but no change since onset in regards to the imbalance/dizziness History of similar episodes: none  Falls (yes/no): no Number of falls in past 6 months: 0  Auditory complaints (tinnitus, pain, drainage): denies drainage and pain in ears  Vision (last eye exam, diplopia, recent changes): denies; eye examination has been greater than 12 years; cataract removal surgery bilaterally  Current Symptoms: (dysarthria, dysphagia, drop attacks, bowel and bladder changes, recent weight loss/gain)  Review of systems negative for red flags.    EXAMINATION  SOMATOSENSORY:  Any N & T in extremities or  weakness: denies      COORDINATION: Finger to Nose: Dysmetric  /  Normal Past Pointing:  Left  /  Right  /  Normal  MUSCULOSKELETAL SCREEN: Cervical Spine ROM: AROM cervical spine WFL   Functional Mobility: Patient performs sit to/from standing independently, but patient pauses with initial standing to try to regain her balance.   Gait: Patient arrives ambulating without AD. Patient ambulates with wide base of support with decreased walking speed and patient slows down when attempting to turn and has decreased scanning of the visual environment.   Balance: Patient is challenged by uneven surfaces, eyes closed, narrow base of support, single leg stance, ambulation with head turns and body turns  POSTURAL CONTROL TESTS:  Clinical Test of Sensory Interaction for Balance (CTSIB):  CONDITION TIME STRATEGY SWAY  Eyes open, firm surface 30 seconds ankle +1  Eyes closed, firm surface 30 seconds ankle +3  Eyes open, foam surface 20 seconds ankle +4  Eyes closed, foam surface 14 seconds ankle +4    OCULOMOTOR / VESTIBULAR TESTING:  Oculomotor Exam- Room Light  Normal Abnormal Comments  Ocular Alignment N    Ocular ROM N    Spontaneous Nystagmus N    Gaze evoked Nystagmus N    Smooth Pursuit N  Few mild hypometric saccades noted in left and right field  Saccades  Abn Few mild hypometric saccades noted in left and right field  VOR  Abn   VOR Cancellation N    Left Head Impulse   Deferred secondary to VNG results indicated R UVL  Right Head Impulse   Deferred secondary to VNG results indicated R UVL    BPPV TESTS: Deferred; patient had VNG testing which was negative for canal testing on Feb 2022.    FUNCTIONAL OUTCOME MEASURES:  Results Comments  DHI 18/100 Low perception of handicap; in need of intervention  ABC Scale 21.8% High falls risk; in need of intervention  DGI 13/24 High falls risk; in need of intervention  FOTO 65/100 Given the patient's risk adjustment variables,  like-patients nationally had a FS score of 53/100 at intake    Neuromuscular Re-education:  VOR X 1 exercise:  Demonstrated and educated as to VOR X1.  Patient performed VOR X 1 horizontal in sitting 1 rep of 30 seconds  and 2 reps of 1 minute each with verbal cues for technique.  Patient reports the target is staying in focus and reports no dizziness.  In standing, performed 1 rep of VORX 1 horizontal 30 second reps and patient noted to have increased sway and patient reported imbalance sensation.  Issued for HEP.  Non-compliant/ Firm Surface: On firm surface, patient performed feet together progressions (progressed to feet about 1.5" apart) and semi-tandem progressions with alternating lead leg with and without body turns and horizontal and vertical head turns.  Patient reports mild increase in dizziness with horizontal head turns.      PT Education - 03/08/21 1906    Education Details discussed plan of care and goals; issued handout on vestibulopathy from [email or web address]; issued HEP-VOR X 1 in sitting and feet together in standing on firm surface with horizontal and vertical head turns    Person(s) Educated Patient    Methods Explanation;Demonstration;Handout;Verbal cues    Comprehension Returned demonstration;Verbalized understanding            PT Short Term Goals - 03/08/21 1908      PT SHORT TERM GOAL #1   Title Pt will be independent with HEP in order to improve balance and decrease dizziness-lightheadedness and imbalance symptoms in order to decrease fall risk and improve function at home and work.    Time 4    Period Weeks    Status New    Target Date 04/05/21             PT Long Term Goals - 03/08/21 1908      PT LONG TERM GOAL #1   Title Patient will demonstrate reduced falls risk as evidenced by Dynamic Gait Index (DGI) >19/24.    Baseline scored 13/24 on 03/07/21    Time 12    Period Weeks    Status New    Target Date 05/30/21      PT LONG TERM GOAL #2    Title Patient will reduce falls risk as indicated by Activities Specific Balance Confidence Scale (ABC) >67%.    Baseline scored 21.8% on 03/07/21;    Time 12    Period Weeks    Status New    Target Date 05/30/21      PT LONG TERM GOAL #3   Title Patient will have improved FOTO score of 5 points or greater in order to demonstrate improvements in patient's ADLs and functional performance.    Baseline scored 65/100 on 03/07/21    Time 12    Period Weeks    Status New    Target Date 05/30/21      PT LONG TERM GOAL #4   Title Patient will report 50% or greater improvement in her symptoms of dizziness/lightheadness and imbalance with provoking motions or position    Time 12    Period Weeks    Status New    Target Date 05/30/21      PT LONG TERM GOAL #5   Title Patient will be able to ambulate 400' or greater on even and uneven surfaces without veering to be able to do yard work.    Baseline patient reports that she veers/bumps into the walls when walking about her home and that she uses a rake for support when moving about the yard due to imbalance on 03/07/21;    Time 12    Period Weeks    Status New    Target Date 05/30/21  Plan - 03/08/21 1910    Clinical Impression Statement Patient presents with reports of sudden onset of imbalance and lightheadedness that beginning in December 2021. 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 Patient scored 21/8% on the ABC scale which indicates high falls risk and scored 13/24 on the DGI indicating falls risk. Patient scored 65/100 on the FOTO outcome measure indicating some difficulty with functional tasks. Patient presents with listed functioanl deficits and would benefit from vestibular PT services to address goals and to try to reduce falls risk.    Personal Factors and Comorbidities Comorbidity 3+    Comorbidities depression, migraines, DM,  HTN    Examination-Activity Limitations [place];Locomotion Level;Stairs    Examination-Participation Restrictions Yard Work    [employer] Evolving/Moderate complexity    Clinical Decision Making Moderate    Rehab Potential Good    PT Frequency 1x / week    PT Duration 12 weeks    PT Treatment/Interventions Canalith Repostioning;Gait training;Stair training;Therapeutic activities;Therapeutic exercise;Balance training;Neuromuscular re-education;Patient/family education;Vestibular    PT Next Visit Plan review HEP and work on progressions, semi-tandem with head and body turns, ambulation with head turns, body wall rolls    PT Home Exercise Plan VOR X 1 horizontal in sitting with plain background 1 minute reps and feet together on firm surface with horizontal and vertical head turns    Consulted and Agree with Plan of Care Patient           Patient will benefit from skilled therapeutic intervention in order to improve the following deficits and impairments:  Decreased balance,Difficulty walking,Dizziness  Visit Diagnosis: Dizziness and giddiness     Problem List Patient Active Problem List   Diagnosis Date Noted  . Dizziness 12/28/2020  . 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/22/2020  . Chronic bilateral low back pain with left-sided sciatica 07/13/2020  . Lichen sclerosus 29/52/8413  . Vulvar irritation 04/09/2019  . Osteopenia 06/14/2018  . Hypothyroidism 04/26/2018  . 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  . Complex tear of lateral meniscus of right knee as current injury 08/03/2017  . Primary osteoarthritis of right knee 08/03/2017  . Advance directive discussed with patient 03/13/2017  . Cataracts, bilateral 08/14/2016  . Rotator cuff tendinitis, right 08/14/2016  . Right kidney mass 08/07/2016  . Hyperlipidemia 08/07/2016  . Urge  incontinence of urine 08/07/2016  . 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 Bladder cancer ([place])   . Controlled type 2 diabetes mellitus ([place])   . Depression   . Benign hypertensive renal disease   . PVD (peripheral vascular disease) ([place])    Lady Deutscher PT, DPT [phone] Lady Deutscher 03/08/2021, 8:21 PM  [place] MAIN [facility] SERVICES [address] [place], [place], [address] Phone: [phone]   Fax:  [phone]  Name: BAYLI QUESINBERRY MRN: [id number] Date of Birth: 20-Mar-1946

## 2021-03-08 ENCOUNTER — Encounter: Payer: Self-pay | Admitting: Physical Therapy

## 2021-03-11 ENCOUNTER — Ambulatory Visit
Admission: RE | Admit: 2021-03-11 | Discharge: 2021-03-11 | Disposition: A | Discharge status: Home / Self Care | Payer: Medicare Other | Source: Ambulatory Visit | Attending: Interventional Radiology | Admitting: Interventional Radiology

## 2021-03-11 ENCOUNTER — Other Ambulatory Visit: Payer: Self-pay

## 2021-03-11 DIAGNOSIS — N2889 Other specified disorders of kidney and ureter: Secondary | ICD-10-CM | POA: Insufficient documentation

## 2021-03-11 DIAGNOSIS — I700 Atherosclerosis of aorta: Secondary | ICD-10-CM | POA: Diagnosis not present

## 2021-03-11 DIAGNOSIS — K760 Fatty (change of) liver, not elsewhere classified: Secondary | ICD-10-CM | POA: Diagnosis not present

## 2021-03-11 LAB — POCT I-STAT CREATININE: Creatinine, Ser: 0.7 mg/dL (ref 0.44–1.00)

## 2021-03-11 MED ORDER — IOHEXOL 300 MG/ML  SOLN
100.0000 mL | Freq: Once | INTRAMUSCULAR | Status: AC | PRN
  Administered 2021-03-11: 100 mL via INTRAVENOUS

## 2021-03-15 ENCOUNTER — Ambulatory Visit
Admission: RE | Admit: 2021-03-15 | Discharge: 2021-03-15 | Disposition: A | Discharge status: Home / Self Care | Payer: Medicare Other | Source: Ambulatory Visit | Attending: Interventional Radiology | Admitting: Interventional Radiology

## 2021-03-15 ENCOUNTER — Encounter: Payer: Self-pay | Admitting: *Deleted

## 2021-03-15 ENCOUNTER — Other Ambulatory Visit: Payer: Self-pay

## 2021-03-15 DIAGNOSIS — Z9889 Other specified postprocedural states: Secondary | ICD-10-CM | POA: Diagnosis not present

## 2021-03-15 DIAGNOSIS — N2889 Other specified disorders of kidney and ureter: Secondary | ICD-10-CM | POA: Diagnosis not present

## 2021-03-15 HISTORY — PX: IR RADIOLOGIST EVAL & MGMT: IMG5224

## 2021-03-15 NOTE — Progress Notes (Signed)
Patient ID: [name], female   DOB: [birth year], 75 y.o.   MRN: [id number]         Chief Complaint: Right renal mass, post right sided renal biopsy and cryoablation (10/27/20)  Referring Physician(s): Hollice Espy (Urology) Park Liter (PCP)  History of Present Illness: KAYLN GARCEAU a 75 y.o.femalewith past medical history significant for diabetes, hypertension, hyperlipidemia and bladder cancer who underwent technically successful right-sided renal cryoablation and biopsy on 10/27/2020 with biopsy compatible with oncocytoma.   The patient is seen today in telemedicine consultation for postprocedural evaluation and management following acquisition of additional postprocedural renal protocol CT scan performed 03/11/2021.  She is currently without complaint.  Past Medical History:  Diagnosis Date  . Arthritis   . Bladder cancer ([place]) 2017  . Chronic low back pain   . Depression   . Diabetes mellitus without complication ([place])   . History of blood transfusion   . History of iron deficiency anemia   . History of pleurisy   . History of pneumonia   . Hyperlipidemia 08/07/2016  . Hypertension   . Hypothyroidism   . Migraines   . Personal history of chemotherapy    Bladder Ca  . Urge incontinence of urine 08/07/2016    Past Surgical History:  Procedure Laterality Date  . ABDOMINAL HYSTERECTOMY    . APPENDECTOMY  1961  . bladder cancer removed  2014  . BOTOX INJECTION N/A 11/08/2020   Procedure: BOTOX INJECTION;  Surgeon: Hollice Espy, MD;  Location: [facility];  Service: Urology;  Laterality: N/A;  . CATARACT EXTRACTION W/ INTRAOCULAR LENS  IMPLANT, BILATERAL    . complete hysterectomy  1999  . DILATION AND CURETTAGE OF UTERUS  1999  . IR RADIOLOGIST EVAL & MGMT  07/20/2020  . IR RADIOLOGIST EVAL & MGMT  10/19/2020  . IR RADIOLOGIST EVAL & MGMT  12/02/2020  . L rotator cuff Left 2010  . R thumb ruptured ligament and tendon  2012  . RADIOLOGY WITH  ANESTHESIA Right 10/27/2020   Procedure: IR WITH ANESTHESIA RIGHT RENAL CRYOABLATION;  Surgeon: Sandi Mariscal, MD;  Location: [facility];  Service: Anesthesiology;  Laterality: Right;  . REVERSE SHOULDER ARTHROPLASTY Right 11/20/2017   Procedure: REVERSE SHOULDER ARTHROPLASTY;  Surgeon: Corky Mull, MD;  Location: [facility];  Service: Orthopedics;  Laterality: Right;  . SHOULDER ARTHROSCOPY WITH BICEPSTENOTOMY Right 01/30/2017   Procedure: SHOULDER ARTHROSCOPY WITH BICEPSTENOTOMY;  Surgeon: Corky Mull, MD;  Location: [facility];  Service: Orthopedics;  Laterality: Right;  biceps tenolysis  . SHOULDER ARTHROSCOPY WITH OPEN ROTATOR CUFF REPAIR Right 01/30/2017   Procedure: SHOULDER ARTHROSCOPY WITH OPEN ROTATOR CUFF REPAIR;  Surgeon: Corky Mull, MD;  Location: [facility];  Service: Orthopedics;  Laterality: Right;  Massive rotator cuff tear repair    Allergies: Statins  Medications: Prior to Admission medications   Medication Sig Start Date End Date Taking? Authorizing Provider  aspirin 81 MG chewable tablet Chew 81 mg by mouth daily.     [provider]  baclofen (LIORESAL) 10 MG tablet Take 1 tablet (10 mg total) by mouth at bedtime. Patient taking differently: Take 10 mg by mouth at bedtime as needed for muscle spasms. 01/02/20   Johnson, Megan P, DO  escitalopram (LEXAPRO) 10 MG tablet Take 1 tablet (10 mg total) by mouth daily. 01/16/21   Johnson, Megan P, DO  gabapentin (NEURONTIN) 300 MG capsule TAKE 1 CAPSULE(300 MG) BY MOUTH AT BEDTIME 01/16/21   Johnson, Megan P, DO  HYDROcodone-acetaminophen (NORCO/VICODIN)  5-325 MG tablet Take 1-2 tablets by mouth every 6 (six) hours as needed for moderate pain. 10/28/20   Allred, Shirlyn Goltz, PA-C  levothyroxine (SYNTHROID) 50 MCG tablet TAKE 1 TABLET BY MOUTH DAILY BEFORE BREAKFAST 03/01/21   Cannady, Jolene T, NP  Lidocaine-Menthol (ICY HOT LIDOCAINE PLUS MENTHOL EX) Apply 1 application topically 2 (two) times daily as needed (muscle pain).     [provider]  losartan (COZAAR) 25 MG tablet Take 1 tablet (25 mg total) by mouth daily. 01/16/21   Johnson, Megan P, DO  metFORMIN (GLUCOPHAGE-XR) 500 MG 24 hr tablet TAKE 2 TABLETS(1000 MG) BY MOUTH IN THE MORNING AND AT BEDTIME 01/16/21   Johnson, Megan P, DO  mirabegron ER ([name]) 50 MG TB24 tablet Take 1 tablet (50 mg total) by mouth daily. 01/10/21   Vaillancourt, Aldona Bar, PA-C  montelukast (SINGULAIR) 10 MG tablet Take 1 tablet (10 mg total) by mouth at bedtime. 05/03/20   Park Liter P, DO  Multiple Vitamin (MULTIVITAMIN WITH MINERALS) TABS tablet Take 1 tablet by mouth daily.    [provider]  naproxen (NAPROSYN) 500 MG tablet Take 1 tablet (500 mg total) by mouth 2 (two) times daily with a meal. 01/16/21   Valerie Roys, DO     Family History  Problem Relation Age of Onset  . Hypertension Brother   . Diabetes Brother   . Healthy Son   . Healthy Son   . Healthy Daughter   . Kidney cancer Neg Hx   . Bladder Cancer Neg Hx   . Breast cancer Neg Hx     Social History   Socioeconomic History  . Marital status: Divorced    Spouse name: Not on file  . Number of children: Not on file  . Years of education: Not on file  . Highest education level: Not on file  Occupational History  . Occupation: retired  Tobacco Use  . Smoking status: Former Smoker    Years: 50.00    Types: Cigarettes    Quit date: 12/18/2006    Years since quitting: 14.2  . Smokeless tobacco: Never Used  Vaping Use  . Vaping Use: Never used  Substance and Sexual Activity  . Alcohol use: Yes    Alcohol/week: 7.0 standard drinks    Types: 7 Shots of liquor per week    Comment: glass of liquor nightly  . Drug use: No  . Sexual activity: Not on file  Other Topics Concern  . Not on file  Social History Narrative  . Not on file   Social Determinants of Health   Financial Resource Strain: Low Risk   . Difficulty of Paying Living Expenses: Not hard at all  Food Insecurity: No  Food Insecurity  . Worried About Charity fundraiser in the Last Year: Never true  . Ran Out of Food in the Last Year: Never true  Transportation Needs: No Transportation Needs  . Lack of Transportation (Medical): No  . Lack of Transportation (Non-Medical): No  Physical Activity: Inactive  . Days of Exercise per Week: 0 days  . Minutes of Exercise per Session: 0 min  Stress: No Stress Concern Present  . Feeling of Stress : Not at all  Social Connections: Not on file    ECOG Status: 0 - Asymptomatic  Review of Systems  Review of Systems: A 12 point ROS discussed and pertinent positives are indicated in the HPI above.  All other systems are negative.  Physical Exam No direct  physical exam was performed (except for noted visual exam findings with Video Visits).   Vital Signs: There were no vitals taken for this visit.  Imaging:  Selected images from the following examinations were reviewed in detail:  CT abdomen and pelvis - 03/11/2021; 10/12/2020 image guided right renal cryoablation and biopsy - 10/27/2020  Review of potential postprocedural CT scan performed 03/11/2021 demonstrates a technically excellent result without evidence of residual or recurrent disease.  No evidence of complication.  Specifically, no evidence of hematoma or urine leak.   CT ABDOMEN W WO CONTRAST  Result Date: 03/12/2021 CLINICAL DATA:  Right renal mass, status post cryoablation EXAM: CT ABDOMEN WITHOUT AND WITH CONTRAST TECHNIQUE: Multidetector CT imaging of the abdomen was performed following the standard protocol before and following the bolus administration of intravenous contrast. CONTRAST:  156mL OMNIPAQUE IOHEXOL 300 MG/ML  SOLN COMPARISON:  CT guided cryoablation, 10/27/2020, CT abdomen, 10/12/2020 FINDINGS: Lower chest: No acute abnormality.  Coronary artery calcification. Hepatobiliary: No focal liver abnormality is seen. Hepatic steatosis. No gallstones, gallbladder wall thickening, or biliary  dilatation. Pancreas: Unremarkable. No pancreatic ductal dilatation or surrounding inflammatory changes. Spleen: Normal in size without focal abnormality. Adrenals/Urinary Tract: Adrenal glands are unremarkable. Ablation defect of the lateral midportion of the right kidney, without residual contrast enhancement (series 6, image 49). The kidneys are otherwise normal without renal calculi, focal lesion, or hydronephrosis. Stomach/Bowel: Stomach is within normal limits. No evidence of bowel wall thickening, distention, or inflammatory changes. Vascular/Lymphatic: Aortic atherosclerosis. No enlarged abdominal lymph nodes. Other: No abdominal wall hernia or abnormality. Musculoskeletal: No acute or significant osseous findings. IMPRESSION: 1. Ablation defect of the lateral midportion of the right kidney, without residual contrast enhancement or other evidence of residual/recurrent disease on this post treatment baseline examination. 2. Hepatic steatosis. 3. Coronary artery disease. Aortic Atherosclerosis (ICD10-I70.0). Electronically Signed   By: Eddie Candle M.D.   On: 03/12/2021 10:47   MM 3D SCREEN BREAST BILATERAL  Result Date: 02/16/2021 CLINICAL DATA:  Screening. EXAM: DIGITAL SCREENING BILATERAL MAMMOGRAM WITH TOMOSYNTHESIS AND CAD TECHNIQUE: Bilateral screening digital craniocaudal and mediolateral oblique mammograms were obtained. Bilateral screening digital breast tomosynthesis was performed. The images were evaluated with computer-aided detection. COMPARISON:  Previous exam(s). ACR Breast Density Category b: There are scattered areas of fibroglandular density. FINDINGS: There are no findings suspicious for malignancy. The images were evaluated with computer-aided detection. IMPRESSION: No mammographic evidence of malignancy. A result letter of this screening mammogram will be mailed directly to the patient. RECOMMENDATION: Screening mammogram in one year. (Code:SM-B-01Y) BI-RADS CATEGORY  1: Negative.  Electronically Signed   By: Fidela Salisbury M.D.   On: 02/16/2021 17:22    Labs:  CBC: Recent Labs    10/25/20 1323 10/27/20 0750  WBC 5.9 6.6  HGB 12.5 13.3  HCT 39.2 41.6  PLT 180 186    COAGS: Recent Labs    10/27/20 0750  INR 0.9    BMP: Recent Labs    05/03/20 1529 07/06/20 1143 10/12/20 1443 10/25/20 1323 10/27/20 0750 01/11/21 1000 03/11/21 1144  NA 141 142  --  141 140 144  --   K 4.7 4.4  --  4.2 4.5 4.6  --   CL 101 103  --  105 103 103  --   CO2 26 26  --  29 27 26   --   GLUCOSE 144* 222*  --  154* 149* 167*  --   BUN 20 16  --  20 19 14   --  CALCIUM 10.4* 10.3  --  10.1 10.2 10.5*  --   CREATININE 0.73 0.75   < > 0.78 0.69 0.79 0.70  GFRNONAA 81 78  --  >60 >60 73  --   GFRAA 93 90  --   --   --  84  --    < > = values in this interval not displayed.    LIVER FUNCTION TESTS: Recent Labs    05/03/20 1529 01/11/21 1000  BILITOT 0.6 0.5  AST 19 22  ALT 28 23  ALKPHOS 86 69  PROT 6.7 6.3  ALBUMIN 4.0 4.1    TUMOR MARKERS: No results for input(s): AFPTM, CEA, CA199, CHROMGRNA in the last 8760 hours.  Assessment and Plan:  Aanyah Loa Ackermanis a 75 y.o.femalewith past medical history significant for diabetes, hypertension, hyperlipidemia and bladder cancer who underwent technically successful right-sided renal cryoablation and biopsy on 10/27/2020 with biopsy compatible with oncocytoma.   The patient is seen today in telemedicine consultation for postprocedural evaluation and management following acquisition of additional postprocedural renal protocol CT scan performed 03/11/2021.  She is currently without complaint.  Selected images from the following examinations were reviewed in detail:  CT abdomen and pelvis - 03/11/2021; 10/12/2020 image guided right renal cryoablation and biopsy - 10/27/2020  Review of potential postprocedural CT scan performed 03/11/2021 demonstrates a technically excellent result without evidence of  residual or recurrent disease.  No evidence of complication.  Specifically, no evidence of hematoma or urine leak.  I explained that while oncocytoma is considered a benign renal lesion, there is a likelihood of a sampling error and as such we will maintain our diligence with surveillance imaging until we document 3 years of stability.  As such, we will obtain our next postprocedural surveillance imaging 3 months (late June/early July 2022).   Prolonged conversations were held with the patient regarding surveillance imaging with either CT or MRI and benefits and risks associated with each.  Following this prolonged conversation with the patient wishes to pursue surveillance imaging with CT given its convenience.  I think this is a reasonable option given her advanced age and as such we will pursue surveillance with renal protocol CT and will reserve MRI for problem-solving purposes.  The patient demonstrated excellent understanding and is in agreement with the above plan of care.  The patient knows to call the interventional radiology clinic with any interval questions or concerns.  Plan: - Postprocedural renal protocol CT scan performed 3 months following the cryoablation (mid February 2022).  A copy of this report was sent to the requesting provider on this date.  Electronically Signed: Sandi Mariscal 03/15/2021, 11:40 AM   I spent a total of 15 Minutes in remote  clinical consultation, greater than 50% of which was counseling/coordinating care for post right-sided renal cryoablation and biopsy.    Visit type: Audio only (telephone). Audio (no video) only due to patient's lack of internet/smartphone capability. Alternative for in-person consultation at [facility], [address] [place], [place], [place]. This visit type was conducted due to national recommendations for restrictions regarding the COVID-19 Pandemic (e.g. social distancing).  This format is felt to be most appropriate for  this patient at this time.  All issues noted in this document were discussed and addressed.

## 2021-03-17 ENCOUNTER — Telehealth: Payer: Self-pay | Admitting: *Deleted

## 2021-03-17 NOTE — Telephone Encounter (Signed)
Medicare/Supplemental insurance not required Prior Auth for Botox procedure

## 2021-03-22 ENCOUNTER — Other Ambulatory Visit: Payer: Self-pay

## 2021-03-22 ENCOUNTER — Ambulatory Visit: Payer: Medicare Other | Attending: Otolaryngology | Admitting: Physical Therapy

## 2021-03-22 ENCOUNTER — Encounter: Payer: Self-pay | Admitting: Physical Therapy

## 2021-03-22 DIAGNOSIS — R42 Dizziness and giddiness: Secondary | ICD-10-CM | POA: Insufficient documentation

## 2021-03-22 NOTE — Therapy (Signed)
[place] MAIN [facility] SERVICES [address] [place], [place], [address] Phone: [phone]   Fax:  [phone]  Physical Therapy Treatment  Patient Details  Name: Stacy Mercado MRN: [id number] Date of Birth: [birth year] Referring Provider (PT): Dr. Pryor Ochoa   Encounter Date: 03/22/2021   PT End of Session - 03/22/21 0939    Visit Number 2    Number of Visits 13    Date for PT Re-Evaluation 05/30/21    PT Start Time 0933    PT Stop Time 1016    PT Time Calculation (min) 43 min    Equipment Utilized During Treatment Gait belt    Activity Tolerance Patient tolerated treatment well    Behavior During Therapy [facility] for tasks assessed/performed           Past Medical History:  Diagnosis Date  . Arthritis   . Bladder cancer ([place]) 2017  . Chronic low back pain   . Depression   . Diabetes mellitus without complication ([place])   . History of blood transfusion   . History of iron deficiency anemia   . History of pleurisy   . History of pneumonia   . Hyperlipidemia 08/07/2016  . Hypertension   . Hypothyroidism   . Migraines   . Personal history of chemotherapy    Bladder Ca  . Urge incontinence of urine 08/07/2016    Past Surgical History:  Procedure Laterality Date  . ABDOMINAL HYSTERECTOMY    . APPENDECTOMY  1961  . bladder cancer removed  2014  . BOTOX INJECTION N/A 11/08/2020   Procedure: BOTOX INJECTION;  Surgeon: Hollice Espy, MD;  Location: [facility] ORS;  Service: Urology;  Laterality: N/A;  . CATARACT EXTRACTION W/ INTRAOCULAR LENS  IMPLANT, BILATERAL    . complete hysterectomy  1999  . DILATION AND CURETTAGE OF UTERUS  1999  . IR RADIOLOGIST EVAL & MGMT  07/20/2020  . IR RADIOLOGIST EVAL & MGMT  10/19/2020  . IR RADIOLOGIST EVAL & MGMT  12/02/2020  . IR RADIOLOGIST EVAL & MGMT  03/15/2021  . L rotator cuff Left 2010  . R thumb ruptured ligament and tendon  2012  . RADIOLOGY WITH ANESTHESIA Right 10/27/2020   Procedure: IR WITH  ANESTHESIA RIGHT RENAL CRYOABLATION;  Surgeon: Sandi Mariscal, MD;  Location: [facility] ORS;  Service: Anesthesiology;  Laterality: Right;  . REVERSE SHOULDER ARTHROPLASTY Right 11/20/2017   Procedure: REVERSE SHOULDER ARTHROPLASTY;  Surgeon: Corky Mull, MD;  Location: [facility] ORS;  Service: Orthopedics;  Laterality: Right;  . SHOULDER ARTHROSCOPY WITH BICEPSTENOTOMY Right 01/30/2017   Procedure: SHOULDER ARTHROSCOPY WITH BICEPSTENOTOMY;  Surgeon: Corky Mull, MD;  Location: [facility] ORS;  Service: Orthopedics;  Laterality: Right;  biceps tenolysis  . SHOULDER ARTHROSCOPY WITH OPEN ROTATOR CUFF REPAIR Right 01/30/2017   Procedure: SHOULDER ARTHROSCOPY WITH OPEN ROTATOR CUFF REPAIR;  Surgeon: Corky Mull, MD;  Location: [facility] ORS;  Service: Orthopedics;  Laterality: Right;  Massive rotator cuff tear repair    There were no vitals filed for this visit.   Subjective Assessment - 03/22/21 0937    Subjective Patient states she bent over to pick something on the gravel driveway last Thursday and she fell forward. Patient reports she did not hurt herself or hit her head. Patient states she had been working in the yard earlier and was bending without difficulty, but states she was holding onto a rake. Patient reports that when she was doing the VOR x1 exercise at home she felt like  she was getting cross side so she stopped doing the exercise.  Patient states she did the other exercises from the home exercise program including feet together and semitandem progressions with horizontal and vertical head turns.    Pertinent History History obtained from patient report and medical history. Patient was seen at Urgent Care for sever right ear pain on 12/02/2020, and she began to experience dizziness/imbalance shortly afterwards. Patient was seen by Dr. Pryor Ochoa, ENT physician, on 02/09/21 and VNG testing was ordered. 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  Patient describes her dizziness as  lightheadedness and sensation of head fullness, and patient states she frequently veers when walking. Patient denies vertigo. Patient reports when she is walking and at night when she sits still more are when she feels the lightheadedness sensation. Patient reports that walking and working outside doing yard work aggravate her symptoms and nothing makes it better. Patient reports that she gets daily episodes of lightheadedness and imbalance that lasts seconds. Patient reports that one physician stated that she might have had shingles in the right ear.    Diagnostic tests VNG: 96% unilateral vestibular loss; MRI brain Feb 2022 revealed evidence of past stroke    Patient Stated Goals Patient would like to be able to garden and do yard work without dizziness/imbalance and patient would like to be able to ambulate without veering.           Neuromuscular Re-education:  Body Wall Rolls:  Patient performed 4 reps of supported, body wall rolls with eyes open with contact-guard assist to min assistance.  Patient touching the wall greater than 50% of the time and had a few losses of balance. Patient reports no dizziness with this activity but reports unsteadiness.   VOR x 1 exercise: Patient performed VOR x1 horizontal in standing 3 reps of 1 minute each with verbal cues initially for the amount of head turn excursion as patient was turning her head too far.  Patient demonstrating good speed and able to keep her eyes fixed on the target. Patient denies dizziness with this activity but noted increased sway.  Ambulation with head turns:  Patient performed 100' trials of forwards and retro ambulation with horizontal and vertical head turns with CGA.  Patient with no overt losses of balance but noted mild unsteadiness and veering with ambulation activities.   Patient demonstrates markedly decreased speed with retro ambulation.  Non-compliant/ Firm Surface: On firm surface, patient performed feet together  progressions and semi-tandem progressions with alternating lead leg with and without horizontal and vertical head turns.   Airex pad:  On firm surface, patient performed narrow base of support with horizontal and vertical head turns multiple 30 to 60 seconds repetitions.   Note: Patient denies dizziness throughout session, but reports unsteadiness with various activities.     PT Education - 03/22/21 1525    Education Details Discussed using patient's walking stick if she is going to be ambulating outside and on uneven surfaces.  Reviewed home exercise program and added progression of diagonal head turns with feet together and semitandem holds.    Person(s) Educated Patient    Methods Explanation;Verbal cues    Comprehension Verbalized understanding            PT Short Term Goals - 03/08/21 1908      PT SHORT TERM GOAL #1   Title Pt will be independent with HEP in order to improve balance and decrease dizziness-lightheadedness and imbalance symptoms in order to  decrease fall risk and improve function at home and work.    Time 4    Period Weeks    Status New    Target Date 04/05/21             PT Long Term Goals - 03/08/21 1908      PT LONG TERM GOAL #1   Title Patient will demonstrate reduced falls risk as evidenced by Dynamic Gait Index (DGI) >19/24.    Baseline scored 13/24 on 03/07/21    Time 12    Period Weeks    Status New    Target Date 05/30/21      PT LONG TERM GOAL #2   Title Patient will reduce falls risk as indicated by Activities Specific Balance Confidence Scale (ABC) >67%.    Baseline scored 21.8% on 03/07/21;    Time 12    Period Weeks    Status New    Target Date 05/30/21      PT LONG TERM GOAL #3   Title Patient will have improved FOTO score of 5 points or greater in order to demonstrate improvements in patient's ADLs and functional performance.    Baseline scored 65/100 on 03/07/21    Time 12    Period Weeks    Status New    Target Date 05/30/21       PT LONG TERM GOAL #4   Title Patient will report 50% or greater improvement in her symptoms of dizziness/lightheadness and imbalance with provoking motions or position    Time 12    Period Weeks    Status New    Target Date 05/30/21      PT LONG TERM GOAL #5   Title Patient will be able to ambulate 400' or greater on even and uneven surfaces without veering to be able to do yard work.    Baseline patient reports that she veers/bumps into the walls when walking about her home and that she uses a rake for support when moving about the yard due to imbalance on 03/07/21;    Time 12    Period Weeks    Status New    Target Date 05/30/21                 Plan - 03/22/21 1535    Clinical Impression Statement Patient motivated to session and reports compliance with home exercise program except for the VOR x1 exercise.  Reviewed VOR x1 exercise in standing and provided cueing for the amount of head turn excursion and patient reported that this felt better and she would try to perform for her home exercise program this week.  Patient encouraged to use her walking stick when ambulating outside and in the community on uneven surfaces. Patient challenged by supported body wall rolls with eyes open and would benefit from continued practice next session.  Patient challenged by Airex pad activities and ambulation with head turns especially with retroambulation.  Next session will plan on performing body wall rolls, VOR x1 with conflicting background, static and then ambulation with ball toss to self and Airex pad progressions.  Patient would benefit from continued vestibular rehab services to further address functional deficits and goals as set on plan of care and in order to help reduce falls risk.    Personal Factors and Comorbidities Comorbidity 3+    Comorbidities depression, migraines, DM, HTN    Examination-Activity Limitations Bend;Locomotion Level;Stairs    Examination-Participation  Restrictions Yard Work    [employer]  Evolving/Moderate complexity    Rehab Potential Good    PT Frequency 1x / week    PT Duration 12 weeks    PT Treatment/Interventions Canalith Repostioning;Gait training;Stair training;Therapeutic activities;Therapeutic exercise;Balance training;Neuromuscular re-education;Patient/family education;Vestibular    PT Next Visit Plan review HEP and work on progressions, semi-tandem with head and body turns, ambulation with head turns, body wall rolls    PT Home Exercise Plan VOR X 1 horizontal in sitting with plain background 1 minute reps and feet together on firm surface with horizontal and vertical head turns    Consulted and Agree with Plan of Care Patient           Patient will benefit from skilled therapeutic intervention in order to improve the following deficits and impairments:  Decreased balance,Difficulty walking,Dizziness  Visit Diagnosis: Dizziness and giddiness     Problem List Patient Active Problem List   Diagnosis Date Noted  . Dizziness 12/28/2020  . 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/22/2020  . Chronic bilateral low back pain with left-sided sciatica 07/13/2020  . Lichen sclerosus 13/14/3888  . Vulvar irritation 04/09/2019  . Osteopenia 06/14/2018  . Hypothyroidism 04/26/2018  . 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  . Complex tear of lateral meniscus of right knee as current injury 08/03/2017  . Primary osteoarthritis of right knee 08/03/2017  . Advance directive discussed with patient 03/13/2017  . Cataracts, bilateral 08/14/2016  . Rotator cuff tendinitis, right 08/14/2016  . Right kidney mass 08/07/2016  . Hyperlipidemia 08/07/2016  . Urge incontinence of urine 08/07/2016  . 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 Bladder cancer ([place])   . Controlled type 2 diabetes mellitus  ([place])   . Depression   . Benign hypertensive renal disease   . PVD (peripheral vascular disease) ([place])    Lady Deutscher PT, DPT [phone] Lady Deutscher 03/22/2021, 3:38 PM  [place] MAIN [facility] SERVICES [address] [place], [place], [address] Phone: [phone]   Fax:  [phone]  Name: TYANNAH SANE MRN: [id number] Date of Birth: May 23, 1946

## 2021-03-25 ENCOUNTER — Telehealth: Payer: Self-pay

## 2021-03-25 NOTE — Progress Notes (Signed)
    Chronic Care Management Pharmacy Assistant   Name: ERIKAH THUMM  MRN: [id number] DOB: 1946/02/10  Reason for Encounter: Disease State- General Adherence Call   Recent office visits:   01/27/21- Park Liter, DO- Dizziness, referral to neurology   Recent consult visits:  03/22/21-  Lady Deutscher, PT(Rehab) 03/07/21-  Lady Deutscher, PT(Rehab) 03/03/21- Gurney Maxin ( Neurology)- Initial Visit  02/23/21- Hollice Espy, MD (Urology)- Cystoscopy, CT scheduled  02/10/21- Debroah Loop, PA-C (Urology)- No medication changes indicated, follow up 3 months for Botox re- treatment [facility] visits:  None in previous 6 months  Medications: Outpatient Encounter Medications as of 03/25/2021  Medication Sig  . aspirin 81 MG chewable tablet Chew 81 mg by mouth daily.   . baclofen (LIORESAL) 10 MG tablet Take 1 tablet (10 mg total) by mouth at bedtime. (Patient taking differently: Take 10 mg by mouth at bedtime as needed for muscle spasms.)  . escitalopram (LEXAPRO) 10 MG tablet Take 1 tablet (10 mg total) by mouth daily.  Marland Kitchen gabapentin (NEURONTIN) 300 MG capsule TAKE 1 CAPSULE(300 MG) BY MOUTH AT BEDTIME  . HYDROcodone-acetaminophen (NORCO/VICODIN) 5-325 MG tablet Take 1-2 tablets by mouth every 6 (six) hours as needed for moderate pain.  Marland Kitchen levothyroxine (SYNTHROID) 50 MCG tablet TAKE 1 TABLET BY MOUTH DAILY BEFORE BREAKFAST  . Lidocaine-Menthol (ICY HOT LIDOCAINE PLUS MENTHOL EX) Apply 1 application topically 2 (two) times daily as needed (muscle pain).  Marland Kitchen losartan (COZAAR) 25 MG tablet Take 1 tablet (25 mg total) by mouth daily.  . metFORMIN (GLUCOPHAGE-XR) 500 MG 24 hr tablet TAKE 2 TABLETS(1000 MG) BY MOUTH IN THE MORNING AND AT BEDTIME  . mirabegron ER ([name]) 50 MG TB24 tablet Take 1 tablet (50 mg total) by mouth daily.  . montelukast (SINGULAIR) 10 MG tablet Take 1 tablet (10 mg total) by mouth at bedtime.  . Multiple Vitamin (MULTIVITAMIN WITH MINERALS)  TABS tablet Take 1 tablet by mouth daily.  . naproxen (NAPROSYN) 500 MG tablet Take 1 tablet (500 mg total) by mouth 2 (two) times daily with a meal.   No facility-administered encounter medications on file as of 03/25/2021.   Have you had any problems recently with your health? Patient stated she is doing good with her health. She says that even though rehab will be long and tough, it is going well.  Have you had any problems with your pharmacy? Patient stated there are no current problems.  What issues or side effects are you having with your medications?  Patient stated she has no side effects or issues.  What would you like me to pass along to Birdena Crandall ,CPP for them to help you with?  Patient stated there is nothing at this time.  What can we do to take care of you better? Patient stated that there is currently nothing we can do to improve.  Star Rating Drugs: Losartan 25mg - 90DS last filled 01/17/21 Metformin 500mg - 90DS last filled 01/05/21  Wil[facility] CPA, CMA

## 2021-03-27 DIAGNOSIS — Z23 Encounter for immunization: Secondary | ICD-10-CM | POA: Diagnosis not present

## 2021-03-29 ENCOUNTER — Encounter: Payer: Medicare Other | Admitting: Physical Therapy

## 2021-03-29 DIAGNOSIS — R42 Dizziness and giddiness: Secondary | ICD-10-CM | POA: Diagnosis not present

## 2021-03-29 DIAGNOSIS — H818X1 Other disorders of vestibular function, right ear: Secondary | ICD-10-CM | POA: Diagnosis not present

## 2021-04-05 ENCOUNTER — Ambulatory Visit: Payer: Medicare Other | Admitting: Physical Therapy

## 2021-04-05 ENCOUNTER — Other Ambulatory Visit: Payer: Self-pay | Admitting: *Deleted

## 2021-04-05 DIAGNOSIS — N2889 Other specified disorders of kidney and ureter: Secondary | ICD-10-CM

## 2021-04-12 ENCOUNTER — Ambulatory Visit: Payer: Medicare Other | Admitting: Physical Therapy

## 2021-04-18 ENCOUNTER — Other Ambulatory Visit: Payer: Self-pay

## 2021-04-18 ENCOUNTER — Other Ambulatory Visit: Payer: Medicare Other

## 2021-04-18 DIAGNOSIS — N2889 Other specified disorders of kidney and ureter: Secondary | ICD-10-CM | POA: Diagnosis not present

## 2021-04-19 ENCOUNTER — Encounter: Payer: Self-pay | Admitting: Physical Therapy

## 2021-04-19 ENCOUNTER — Ambulatory Visit: Payer: Medicare Other | Attending: Otolaryngology | Admitting: Physical Therapy

## 2021-04-19 DIAGNOSIS — R42 Dizziness and giddiness: Secondary | ICD-10-CM | POA: Insufficient documentation

## 2021-04-19 LAB — URINALYSIS, COMPLETE
Bilirubin, UA: NEGATIVE
Glucose, UA: NEGATIVE
Ketones, UA: NEGATIVE
Leukocytes,UA: NEGATIVE
Nitrite, UA: NEGATIVE
Protein,UA: NEGATIVE
RBC, UA: NEGATIVE
Specific Gravity, UA: 1.02 (ref 1.005–1.030)
Urobilinogen, Ur: 0.2 mg/dL (ref 0.2–1.0)
pH, UA: 5.5 (ref 5.0–7.5)

## 2021-04-19 LAB — MICROSCOPIC EXAMINATION

## 2021-04-19 NOTE — Therapy (Signed)
[place] MAIN [facility] SERVICES [address] [place], [place], [address] Phone: [phone]   Fax:  [phone]  Physical Therapy Treatment  Patient Details  Name: Stacy Mercado MRN: [id number] Date of Birth: 24-Sep-1946 Referring Provider (PT): Dr. Pryor Ochoa   Encounter Date: 04/19/2021   PT End of Session - 04/19/21 1106    Visit Number 3    Number of Visits 13    Date for PT Re-Evaluation 05/30/21    PT Start Time 1017    PT Stop Time 1105    PT Time Calculation (min) 48 min    Equipment Utilized During Treatment Gait belt    Activity Tolerance Patient tolerated treatment well    Behavior During Therapy [facility] for tasks assessed/performed           Past Medical History:  Diagnosis Date  . Arthritis   . Bladder cancer ([place]) 2017  . Chronic low back pain   . Depression   . Diabetes mellitus without complication ([place])   . History of blood transfusion   . History of iron deficiency anemia   . History of pleurisy   . History of pneumonia   . Hyperlipidemia 08/07/2016  . Hypertension   . Hypothyroidism   . Migraines   . Personal history of chemotherapy    Bladder Ca  . Urge incontinence of urine 08/07/2016    Past Surgical History:  Procedure Laterality Date  . ABDOMINAL HYSTERECTOMY    . APPENDECTOMY  1961  . bladder cancer removed  2014  . BOTOX INJECTION N/A 11/08/2020   Procedure: BOTOX INJECTION;  Surgeon: Hollice Espy, MD;  Location: [facility] ORS;  Service: Urology;  Laterality: N/A;  . CATARACT EXTRACTION W/ INTRAOCULAR LENS  IMPLANT, BILATERAL    . complete hysterectomy  1999  . DILATION AND CURETTAGE OF UTERUS  1999  . IR RADIOLOGIST EVAL & MGMT  07/20/2020  . IR RADIOLOGIST EVAL & MGMT  10/19/2020  . IR RADIOLOGIST EVAL & MGMT  12/02/2020  . IR RADIOLOGIST EVAL & MGMT  03/15/2021  . L rotator cuff Left 2010  . R thumb ruptured ligament and tendon  2012  . RADIOLOGY WITH ANESTHESIA Right 10/27/2020   Procedure: IR WITH  ANESTHESIA RIGHT RENAL CRYOABLATION;  Surgeon: Sandi Mariscal, MD;  Location: [facility] ORS;  Service: Anesthesiology;  Laterality: Right;  . REVERSE SHOULDER ARTHROPLASTY Right 11/20/2017   Procedure: REVERSE SHOULDER ARTHROPLASTY;  Surgeon: Corky Mull, MD;  Location: [facility] ORS;  Service: Orthopedics;  Laterality: Right;  . SHOULDER ARTHROSCOPY WITH BICEPSTENOTOMY Right 01/30/2017   Procedure: SHOULDER ARTHROSCOPY WITH BICEPSTENOTOMY;  Surgeon: Corky Mull, MD;  Location: [facility] ORS;  Service: Orthopedics;  Laterality: Right;  biceps tenolysis  . SHOULDER ARTHROSCOPY WITH OPEN ROTATOR CUFF REPAIR Right 01/30/2017   Procedure: SHOULDER ARTHROSCOPY WITH OPEN ROTATOR CUFF REPAIR;  Surgeon: Corky Mull, MD;  Location: [facility] ORS;  Service: Orthopedics;  Laterality: Right;  Massive rotator cuff tear repair    There were no vitals filed for this visit.   Subjective Assessment - 04/19/21 1109    Subjective Patient states that she had to cancel a few appointments because she was walking out in her yard and had twisted her right ankle and it was hurting. Patient states that she rested and iced it and that her ankle is not hurting now. Patient reports no falls since last seen in the clinic, but states her balance is more off. Patient reports that she is feeling more  unsteady now. Patient states she has been doing some of her exercises. Patient states she has been doing more outdoor work, gardening. Patient states she has been getting cramps in her legs and thighs and that her back has been hurting her more since gardening. Patient states she has a walking stick. Recommended that she use it at all times until her balance improves.    Pertinent History History obtained from patient report and medical history. Patient was seen at Urgent Care for sever right ear pain on 12/02/2020, and she began to experience dizziness/imbalance shortly afterwards. Patient was seen by Dr. Pryor Ochoa, ENT physician, on 02/09/21 and VNG testing was  ordered. 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  Patient describes her dizziness as lightheadedness and sensation of head fullness, and patient states she frequently veers when walking. Patient denies vertigo. Patient reports when she is walking and at night when she sits still more are when she feels the lightheadedness sensation. Patient reports that walking and working outside doing yard work aggravate her symptoms and nothing makes it better. Patient reports that she gets daily episodes of lightheadedness and imbalance that lasts seconds. Patient reports that one physician stated that she might have had shingles in the right ear.    Diagnostic tests VNG: 96% unilateral vestibular loss; MRI brain Feb 2022 revealed evidence of past stroke    Patient Stated Goals Patient would like to be able to garden and do yard work without dizziness/imbalance and patient would like to be able to ambulate without veering.    Currently in Pain? Yes    Pain Location Back    Pain Orientation Lower           Neuromuscular Re-education:  Ambulation with head turns:   Patient performed 125' trials of forwards and then retro ambulation with one trekking pole with horizontal and vertical head turns with CGA.   Patient demonstrates imbalance with these activities and noted decrease in gait speed with all, but more decreased with retro ambulation.   Slow Marching: Patient performed on firm surface, slow marching with CGA with patient progressing from two hand to one hand support with practice. Performed 1 set 15 reps and 1 set 8 reps secondary to fatigue requiring sitting rest break.   Balance pod tapping: On firm surface, patient performed foot tapping to cones in series of one, two and then three targets as called out by therapist with CGA. Patient progressed from two hand to one hand support with practice. Patient reports that this activity requires her to concentrate and is  challenging for her balance.   Mini-squats: Patient performed 2 sets of 15 reps mini-squats with 3 second holds with verbal cues with CGA.  Patient able to maintain good upright posture.       PT Education - 04/19/21 1112    Education Details Discussed importance of home exercise program and added mini-squats with 3 second holds and slow marching with 3 second holds with one hand assist to HEP. Issued Vestibular PowerPoint slides 5, 6    Person(s) Educated Patient    Methods Explanation;Demonstration;Handout;Verbal cues    Comprehension Verbalized understanding;Returned demonstration            PT Short Term Goals - 03/08/21 1908      PT SHORT TERM GOAL #1   Title Pt will be independent with HEP in order to improve balance and decrease dizziness-lightheadedness and imbalance symptoms in order to decrease fall risk and improve function at  home and work.    Time 4    Period Weeks    Status New    Target Date 04/05/21             PT Long Term Goals - 03/08/21 1908      PT LONG TERM GOAL #1   Title Patient will demonstrate reduced falls risk as evidenced by Dynamic Gait Index (DGI) >19/24.    Baseline scored 13/24 on 03/07/21    Time 12    Period Weeks    Status New    Target Date 05/30/21      PT LONG TERM GOAL #2   Title Patient will reduce falls risk as indicated by Activities Specific Balance Confidence Scale (ABC) >67%.    Baseline scored 21.8% on 03/07/21;    Time 12    Period Weeks    Status New    Target Date 05/30/21      PT LONG TERM GOAL #3   Title Patient will have improved FOTO score of 5 points or greater in order to demonstrate improvements in patient's ADLs and functional performance.    Baseline scored 65/100 on 03/07/21    Time 12    Period Weeks    Status New    Target Date 05/30/21      PT LONG TERM GOAL #4   Title Patient will report 50% or greater improvement in her symptoms of dizziness/lightheadness and imbalance with provoking motions or  position    Time 12    Period Weeks    Status New    Target Date 05/30/21      PT LONG TERM GOAL #5   Title Patient will be able to ambulate 400' or greater on even and uneven surfaces without veering to be able to do yard work.    Baseline patient reports that she veers/bumps into the walls when walking about her home and that she uses a rake for support when moving about the yard due to imbalance on 03/07/21;    Time 12    Period Weeks    Status New    Target Date 05/30/21                 Plan - 04/19/21 1142    Clinical Impression Statement Patient reports that she twisted her right ankle a few weeks ago and that she tried to rest and ice her ankle, Patient reports that her ankle is no longer hurting, but states that she feels more unsteadiness now and difficulty with her balance. Recommended that patient use her walking stick for all mobility at this time for safety. Patient challenged by single leg stance activties this date and required one hand assist for balance. Added additional exercises to home exericse program, issued slow marching and mini-squats. Plan on performing balance pod tapping and working on progressions next session as well as reviewing slow marching. Plan on working on ambulation while scanning for targets next session. Patient would benefit from continued PT services to further address balance deficits and goals as set on plan of care.    Personal Factors and Comorbidities Comorbidity 3+    Comorbidities depression, migraines, DM, HTN    Examination-Activity Limitations Bend;Locomotion Level;Stairs    Examination-Participation Restrictions Yard Work    [employer] Evolving/Moderate complexity    Rehab Potential Good    PT Frequency 1x / week    PT Duration 12 weeks    PT Treatment/Interventions Canalith Repostioning;Gait training;Stair training;Therapeutic activities;Therapeutic exercise;Balance training;Neuromuscular  re-education;Patient/family education;Vestibular    PT Next Visit Plan review HEP and work on progressions, semi-tandem with head and body turns, ambulation with head turns, body wall rolls    PT Home Exercise Plan VOR X 1 horizontal in sitting with plain background 1 minute reps and feet together on firm surface with horizontal and vertical head turns    Consulted and Agree with Plan of Care Patient           Patient will benefit from skilled therapeutic intervention in order to improve the following deficits and impairments:  Decreased balance,Difficulty walking,Dizziness  Visit Diagnosis: Dizziness and giddiness     Problem List Patient Active Problem List   Diagnosis Date Noted  . Dizziness 12/28/2020  . 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/22/2020  . Chronic bilateral low back pain with left-sided sciatica 07/13/2020  . Lichen sclerosus [id number]  . Vulvar irritation 04/09/2019  . Osteopenia 06/14/2018  . Hypothyroidism 04/26/2018  . 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  . Complex tear of lateral meniscus of right knee as current injury 08/03/2017  . Primary osteoarthritis of right knee 08/03/2017  . Advance directive discussed with patient 03/13/2017  . Cataracts, bilateral 08/14/2016  . Rotator cuff tendinitis, right 08/14/2016  . Right kidney mass 08/07/2016  . Hyperlipidemia 08/07/2016  . Urge incontinence of urine 08/07/2016  . 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 Bladder cancer ([place])   . Controlled type 2 diabetes mellitus ([place])   . Depression   . Benign hypertensive renal disease   . PVD (peripheral vascular disease) ([place])    Lady Deutscher PT, DPT [id number] Lady Deutscher 04/19/2021, 4:24 PM  [place] MAIN [facility] SERVICES [address] [place], [place], [address] Phone: [phone]   Fax:   [phone]  Name: Stacy Mercado MRN: [id number] Date of Birth: February 03, 1946

## 2021-04-21 LAB — CULTURE, URINE COMPREHENSIVE

## 2021-04-25 ENCOUNTER — Ambulatory Visit (INDEPENDENT_AMBULATORY_CARE_PROVIDER_SITE_OTHER): Payer: Medicare Other | Admitting: Pharmacist

## 2021-04-25 DIAGNOSIS — E782 Mixed hyperlipidemia: Secondary | ICD-10-CM

## 2021-04-25 DIAGNOSIS — E119 Type 2 diabetes mellitus without complications: Secondary | ICD-10-CM | POA: Diagnosis not present

## 2021-04-25 DIAGNOSIS — I10 Essential (primary) hypertension: Secondary | ICD-10-CM | POA: Diagnosis not present

## 2021-04-25 NOTE — Progress Notes (Signed)
Chronic Care Management Pharmacy Note  05/12/2021 Name:  Stacy Mercado MRN:  [id number] DOB:  1946-07-06  Subjective: Stacy Mercado is an 75 y.o. year old female who is a primary patient of Valerie Roys, DO.  The CCM team was consulted for assistance with disease management and care coordination needs.    Engaged with patient by telephone for follow up visit in response to provider referral for pharmacy case management and/or care coordination services.   Consent to Services:  The patient was given information about Chronic Care Management services, agreed to services, and gave verbal consent prior to initiation of services.  Please see initial visit note for detailed documentation.   Patient Care Team: Valerie Roys, DO as PCP - General (Family Medicine) Vladimir Faster, [facility] as Pharmacist (Pharmacist)  Recent office visits: 01/27/21- Johnson(PCP)- refer to neuro  Recent consult visits: 03/22/21-  Lady Deutscher, PT(Rehab) 03/11/21-[facility] CT ABD w &  wo contrast, no evidence of recurrent cncer , hepatic steatosis, CAD 03/07/21-  Lady Deutscher, PT(Rehab) 03/03/21- Gurney Maxin ( Neurology)- Initial Visit , continue asa, PT, and follow with ENT.Marland Kitchenold left infarct incedentally on MRI 2/08 02/23/21- Hollice Espy, MD (Urology)- Cystoscopy, CT scheduled  02/10/21- Debroah Loop, PA-C (Urology)- No medication changes indicated, follow up 3 months for Botox re- treatment [facility] visits: None in previous 6 months  Objective:  Lab Results  Component Value Date   CREATININE 0.70 03/11/2021   BUN 14 01/11/2021   GFRNONAA 73 01/11/2021   GFRAA 84 01/11/2021   NA 144 01/11/2021   K 4.6 01/11/2021   CALCIUM 10.5 (H) 01/11/2021   CO2 26 01/11/2021   GLUCOSE 167 (H) 01/11/2021    Lab Results  Component Value Date/Time   HGBA1C 6.3 01/11/2021 09:59 AM   HGBA1C 7.3 (H) 10/25/2020 01:23 PM   HGBA1C 6.4 07/06/2020 11:23 AM   HGBA1C 5.3 05/24/2016  12:00 AM   HGBA1C 5.9 12/29/2015 12:00 AM   MICROALBUR 10 01/02/2020 09:23 AM   MICROALBUR 10 03/31/2019 01:51 PM    Last diabetic Eye exam:  Lab Results  Component Value Date/Time   HMDIABEYEEXA No Retinopathy 09/12/2016 04:27 PM    Last diabetic Foot exam: No results found for: HMDIABFOOTEX   Lab Results  Component Value Date   CHOL 124 01/11/2021   HDL 42 01/11/2021   LDLCALC 57 01/11/2021   TRIG 146 01/11/2021    Hepatic Function Latest Ref Rng & Units 01/11/2021 05/03/2020 01/02/2020  Total Protein 6.0 - 8.5 g/dL 6.3 6.7 6.0  Albumin 3.7 - 4.7 g/dL 4.1 4.0 3.9  AST 0 - 40 IU/L '22 19 20  ' ALT 0 - 32 IU/L '23 28 29  ' Alk Phosphatase 44 - 121 IU/L 69 86 93  Total Bilirubin 0.0 - 1.2 mg/dL 0.5 0.6 0.4    Lab Results  Component Value Date/Time   TSH 3.900 01/02/2020 09:29 AM   TSH 3.240 03/31/2019 01:54 PM    CBC Latest Ref Rng & Units 10/27/2020 10/25/2020 01/02/2020  WBC 4.0 - 10.5 K/uL 6.6 5.9 7.3  Hemoglobin 12.0 - 15.0 g/dL 13.3 12.5 12.9  Hematocrit 36.0 - 46.0 % 41.6 39.2 39.4  Platelets 150 - 400 K/uL 186 180 174    No results found for: VD25OH  Clinical ASCVD: Yes  The ASCVD Risk score Mikey Bussing DC Jr., et al., 2013) failed to calculate for the following reasons:   The valid total cholesterol range is 130 to 320 mg/dL  Depression screen [facility] 2/9 01/14/2021 12/28/2020 04/01/2020  Decreased Interest 0 0 0  Down, Depressed, Hopeless 0 0 0  PHQ - 2 Score 0 0 0  Altered sleeping - 3 1  Tired, decreased energy - 3 1  Change in appetite - 3 0  Feeling bad or failure about yourself  - 0 0  Trouble concentrating - 0 0  Moving slowly or fidgety/restless - 0 0  Suicidal thoughts - 0 -  PHQ-9 Score - 9 2  Difficult doing work/chores - - Not difficult at all       Social History   Tobacco Use  Smoking Status Former Smoker  . Years: 50.00  . Types: Cigarettes  . Quit date: 12/18/2006  . Years since quitting: 14.4  Smokeless Tobacco Never Used   BP Readings from  Last 3 Encounters:  05/10/21 134/67  04/26/21 134/70  02/23/21 130/76   Pulse Readings from Last 3 Encounters:  05/10/21 66  04/26/21 69  02/23/21 76   Wt Readings from Last 3 Encounters:  05/10/21 183 lb (83 kg)  04/26/21 183 lb (83 kg)  02/23/21 185 lb (83.9 kg)   BMI Readings from Last 3 Encounters:  05/10/21 30.45 kg/m  04/26/21 30.45 kg/m  02/23/21 30.79 kg/m    Assessment/Interventions: Review of patient past medical history, allergies, medications, health status, including review of consultants reports, laboratory and other test data, was performed as part of comprehensive evaluation and provision of chronic care management services.   SDOH:  (Social Determinants of Health) assessments and interventions performed: No  SDOH Screenings   Alcohol Screen: Not on file  Depression (PHQ2-9): Low Risk   . PHQ-2 Score: 0  Financial Resource Strain: Low Risk   . Difficulty of Paying Living Expenses: Not hard at all  Food Insecurity: No Food Insecurity  . Worried About Charity fundraiser in the Last Year: Never true  . Ran Out of Food in the Last Year: Never true  Housing: Not on file  Physical Activity: Inactive  . Days of Exercise per Week: 0 days  . Minutes of Exercise per Session: 0 min  Social Connections: Not on file  Stress: No Stress Concern Present  . Feeling of Stress : Not at all  Tobacco Use: Medium Risk  . Smoking Tobacco Use: Former Smoker  . Smokeless Tobacco Use: Never Used  Transportation Needs: No Transportation Needs  . Lack of Transportation (Medical): No  . Lack of Transportation (Non-Medical): No     Immunization History  Administered Date(s) Administered  . Influenza, High Dose Seasonal PF 08/28/2016, 09/20/2017, 09/17/2018, 08/07/2019  . PFIZER(Purple Top)SARS-COV-2 Vaccination 01/13/2020, 02/03/2020, 09/25/2020  . Pneumococcal Conjugate-13 06/29/2015, 09/27/2016  . Pneumococcal Polysaccharide-23 08/06/2013, 10/28/2020  . Tdap  11/28/2016  . Zoster, Live 08/06/2013    Conditions to be addressed/monitored:  Hypertension, Hyperlipidemia, Diabetes, Hypothyroidism, Depression and OAB/ H/O bladder cancer  Care Plan : [place]  Updates made by Vladimir Faster, [place] since 05/12/2021 12:00 AM    Problem: DM2, HTN, HLD, Depression, Osteopenia, chronic pain, urge incontinence   Priority: High    Long-Range Goal: Disease Management   Start Date: 04/25/2021  This Visit's Progress: On track  Priority: High  Note:   Current Barriers:  . Unable to independently afford treatment regimen . Unable to independently monitor therapeutic efficacy . Unable to achieve control of dizziness   Pharmacist Clinical Goal(s):  Marland Kitchen Patient will verbalize ability to afford treatment regimen . achieve adherence to  monitoring guidelines and medication adherence to achieve therapeutic efficacy . contact provider office for questions/concerns as evidenced notation of same in electronic health record through collaboration with PharmD and provider.   Interventions: . 1:1 collaboration with Valerie Roys, DO regarding development and update of comprehensive plan of care as evidenced by provider attestation and co-signature . Inter-disciplinary care team collaboration (see longitudinal plan of care) . Comprehensive medication review performed; medication list updated in electronic medical record BP Readings from Last 3 Encounters:  05/10/21 134/67  04/26/21 134/70  02/23/21 130/76    Hypertension (BP goal <130/80) -Controlled -Current treatment:  Losartan 25 mg qd -Medications previously tried: NA -Current home readings: <130/80  -Denies hypotensive/hypertensive symptoms -Educated on BP goals and benefits of medications for prevention of heart attack, stroke and kidney damage; Daily salt intake goal < 2300 mg; Exercise goal of 150 minutes per week; Symptoms of hypotension and importance of maintaining adequate  hydration; -Counseled to monitor BP at home 2-3 times weekly, document, and provide log at future appointments -Counseled on diet and exercise extensively Recommended to continue current medication    Hyperlipidemia/ PVD/ Previous TIA: (LDL goal < 70) -Controlled -Current treatment: . Repatha 140 mg q14d . Aspirin EC 81 mg -Medications previously tried: Myalgia to statins  -Current dietary patterns: has lost 20 lbs -Current exercise habits: working with PT 2/2 to ongoing dizziness from tympanic perf after ear infection -Educated on Cholesterol goals;  Exercise goal of 150 minutes per week; -Recommended to continue current medication Assessed patient finances. Repatha through patient assistance  Diabetes (A1c goal <7%) -Controlled -Current medications: Marland Kitchen Metformin XR 1000 mb bid -Medications previously tried: na  -Current home glucose readings . fasting glucose: na . post prandial glucose: na -Denies hypoglycemic/hyperglycemic symptoms --Educated on Complications of diabetes including kidney damage, retinal damage, and cardiovascular disease; Prevention and management of hypoglycemic episodes; -Counseled to check feet daily and get yearly eye exams -Recommended to continue current medication Recommended patient consider obtaining glucometer to have for occassional use  Osteoporosis / Osteopenia (Goal prevent fracrture) -Not ideally controlled -Last DEXA Scan: 2019   T-Score femoral neck: -1.2  T-Score forearm radius: -2.0  10-year probability of major osteoporotic fracture: 9.9%  10-year probability of hip fracture: 1.5% -Patient is not a candidate for pharmacologic treatment -Current treatment   None -Medications previously tried: na  -Recommend [phone] units of vitamin D daily. Recommend 1200 mg of calcium daily from dietary and supplemental sources. Recommend weight-bearing and muscle strengthening exercises for building and maintaining bone density. -Recommended  repeat DEXA and starting Calcium vitamin supplementation.   Patient Goals/Self-Care Activities . Patient will:  - take medications as prescribed check blood pressure 2-3 times weekly, document, and provide at future appointments collaborate with provider on medication access solutions  Follow Up Plan: Telephone follow up appointment with care management team member scheduled for: 2 months        Medication Assistance: Repathaobtained through [place]  medication assistance program.  Enrollment ends 12/17/21  Patient's preferred pharmacy is:  [facility] [place], [address] [place] [address] Phone: [phone] Fax: [phone]  Uses pill box? Yes Pt endorses 90% compliance  We discussed: Benefits of medication synchronization, packaging and delivery as well as enhanced pharmacist oversight with [employer]. Patient decided to: Continue current medication management strategy  Care Plan and Follow Up Patient Decision:  Patient agrees to Care Plan  and Follow-up.  Plan: Telephone follow up appointment with care management team member scheduled for:   1 month CPA, 2 months PharmD  Junita Push. Kenton Kingfisher PharmD, [place] [facility] [phone]

## 2021-04-26 ENCOUNTER — Other Ambulatory Visit: Payer: Self-pay

## 2021-04-26 ENCOUNTER — Encounter: Payer: Self-pay | Admitting: Urology

## 2021-04-26 ENCOUNTER — Ambulatory Visit (INDEPENDENT_AMBULATORY_CARE_PROVIDER_SITE_OTHER): Payer: Medicare Other | Admitting: Urology

## 2021-04-26 VITALS — BP 134/70 | HR 69 | Ht 65.0 in | Wt 183.0 lb

## 2021-04-26 DIAGNOSIS — N3281 Overactive bladder: Secondary | ICD-10-CM

## 2021-04-26 DIAGNOSIS — N2889 Other specified disorders of kidney and ureter: Secondary | ICD-10-CM

## 2021-04-26 DIAGNOSIS — Z8551 Personal history of malignant neoplasm of bladder: Secondary | ICD-10-CM

## 2021-04-26 MED ORDER — ONABOTULINUMTOXINA 100 UNITS IJ SOLR
100.0000 [IU] | Freq: Once | INTRAMUSCULAR | Status: AC
  Administered 2021-04-26: 100 [IU] via INTRAMUSCULAR

## 2021-04-26 MED ORDER — SULFAMETHOXAZOLE-TRIMETHOPRIM 800-160 MG PO TABS
1.0000 | ORAL_TABLET | Freq: Once | ORAL | Status: AC
Start: 2021-04-26 — End: 2021-04-26
  Administered 2021-04-26: 1 via ORAL

## 2021-04-26 MED ORDER — LIDOCAINE HCL 2 % IJ SOLN
50.0000 mL | Freq: Once | INTRAMUSCULAR | Status: AC
  Administered 2021-04-26: 1000 mg

## 2021-04-26 NOTE — Progress Notes (Signed)
Bladder Instillation  Prior to Bladder Botox patient is present today for a Bladder Instillation of Lidocaine 2%. Patient was cleaned and prepped in a sterile fashion with betadine and lidocaine 2% jelly was instilled into the urethra.  A 14FR catheter was inserted, urine return was noted 31ml, urine was yellow in color.  40 ml was instilled into the bladder. The catheter was then removed. Patient tolerated well, no complications were noted Patient held in bladder for 30 minutes prior to procedure starting.   Performed by: Fonnie Jarvis, CMA

## 2021-04-28 NOTE — Progress Notes (Signed)
   04/28/21  CC:  Chief Complaint  Patient presents with  . Cysto    Bladder Botox    HPI: 75 year old female with severe OAB who presents today for Botox injection in the office.  Please see previous notes for details.  She is also on [name] in addition to Botox.  She is requesting more samples of this today.  She also has a personal history of a right renal mass status post ablation followed by interventional radiology.  Lastly, she has a remote history of bladder cancer.  She was diagnosed with bladder tumors (low grade superficial) in October 2015 and March 2016. She underwent induction BCG completed in March 2016. as well as maintenance with cytology and was followed with cystoscopy every 3 months. Her last BCG maintenance BCG was in 11/2015.  No recurrences.  Blood pressure 134/70, pulse 69, height 5\' 5"  (1.651 m), weight 183 lb (83 kg). NED. A&Ox3.   No respiratory distress   Abd soft, NT, ND Normal external genitalia with patent urethral meatus  Cystoscopy Procedure Note  Patient identification was confirmed, informed consent was obtained, and patient was prepped using Betadine solution.  Lidocaine jelly was administered per urethral meatus.    Procedure: - Flexible cystoscope introduced, without any difficulty.   - Thorough search of the bladder revealed:    normal urethral meatus    normal urothelium    no stones    no ulcers     no tumors    no urethral polyps    no trabeculation  - Ureteral orifices were normal in position and appearance.  At this point in time, using a flexible needle, a total of 20 injections and 2 rows of 10 were administered in the posterior bladder wall.  Each of these was 1/2 cc distributing a total of 100 units into the bladder.  It is well-tolerated.  There is minimal bleeding.  She did receive periprocedural antibiotics.  Assessment/ Plan:   1. OAB (overactive bladder) Uncomplicated Botox injection, will likely be due for  another injection about 6 months  [name] 50 mg, additional samples given today  - lidocaine (XYLOCAINE) 2 % (with pres) injection 1,000 mg - botulinum toxin Type A (BOTOX) injection 100 Units - sulfamethoxazole-trimethoprim (BACTRIM DS) 800-160 MG per tablet 1 tablet  2. Right renal mass Status post ablation, serial imaging by radiology  3. History of bladder cancer No evidence of recurrence, will plan for cystoscopy at the time of each Botox    Return for 2wk w/PVR.  Hollice Espy, MD

## 2021-05-03 ENCOUNTER — Other Ambulatory Visit: Payer: Self-pay

## 2021-05-03 ENCOUNTER — Encounter: Payer: Self-pay | Admitting: Physical Therapy

## 2021-05-03 ENCOUNTER — Ambulatory Visit: Payer: Medicare Other | Admitting: Physical Therapy

## 2021-05-03 DIAGNOSIS — R42 Dizziness and giddiness: Secondary | ICD-10-CM

## 2021-05-03 NOTE — Therapy (Signed)
[place] [facility] SERVICES [address] [place], [place], [address] Phone: [phone]   Fax:  [phone]  Physical Therapy Treatment  Patient Details  Name: Stacy Mercado MRN: [id number] Date of Birth: 1946-03-16 Referring Provider (PT): Dr. Pryor Ochoa   Encounter Date: 05/03/2021   PT End of Session - 05/03/21 1019    Visit Number 4    Number of Visits 13    Date for PT Re-Evaluation 05/30/21    PT Start Time 1019    PT Stop Time 1103    PT Time Calculation (min) 44 min    Equipment Utilized During Treatment Gait belt    Activity Tolerance Patient tolerated treatment well    Behavior During Therapy [facility] for tasks assessed/performed           Past Medical History:  Diagnosis Date  . Arthritis   . Bladder cancer ([place]) 2017  . Chronic low back pain   . Depression   . Diabetes mellitus without complication ([place])   . History of blood transfusion   . History of iron deficiency anemia   . History of pleurisy   . History of pneumonia   . Hyperlipidemia 08/07/2016  . Hypertension   . Hypothyroidism   . Migraines   . Personal history of chemotherapy    Bladder Ca  . Urge incontinence of urine 08/07/2016    Past Surgical History:  Procedure Laterality Date  . ABDOMINAL HYSTERECTOMY    . APPENDECTOMY  1961  . bladder cancer removed  2014  . BOTOX INJECTION N/A 11/08/2020   Procedure: BOTOX INJECTION;  Surgeon: Hollice Espy, MD;  Location: [facility] ORS;  Service: Urology;  Laterality: N/A;  . CATARACT EXTRACTION W/ INTRAOCULAR LENS  IMPLANT, BILATERAL    . complete hysterectomy  1999  . DILATION AND CURETTAGE OF UTERUS  1999  . IR RADIOLOGIST EVAL & MGMT  07/20/2020  . IR RADIOLOGIST EVAL & MGMT  10/19/2020  . IR RADIOLOGIST EVAL & MGMT  12/02/2020  . IR RADIOLOGIST EVAL & MGMT  03/15/2021  . L rotator cuff Left 2010  . R thumb ruptured ligament and tendon  2012  . RADIOLOGY WITH ANESTHESIA Right 10/27/2020   Procedure: IR WITH  ANESTHESIA RIGHT RENAL CRYOABLATION;  Surgeon: Sandi Mariscal, MD;  Location: [facility] ORS;  Service: Anesthesiology;  Laterality: Right;  . REVERSE SHOULDER ARTHROPLASTY Right 11/20/2017   Procedure: REVERSE SHOULDER ARTHROPLASTY;  Surgeon: Corky Mull, MD;  Location: [facility] ORS;  Service: Orthopedics;  Laterality: Right;  . SHOULDER ARTHROSCOPY WITH BICEPSTENOTOMY Right 01/30/2017   Procedure: SHOULDER ARTHROSCOPY WITH BICEPSTENOTOMY;  Surgeon: Corky Mull, MD;  Location: [facility] ORS;  Service: Orthopedics;  Laterality: Right;  biceps tenolysis  . SHOULDER ARTHROSCOPY WITH OPEN ROTATOR CUFF REPAIR Right 01/30/2017   Procedure: SHOULDER ARTHROSCOPY WITH OPEN ROTATOR CUFF REPAIR;  Surgeon: Corky Mull, MD;  Location: [facility] ORS;  Service: Orthopedics;  Laterality: Right;  Massive rotator cuff tear repair    There were no vitals filed for this visit.   Subjective Assessment - 05/03/21 1022    Subjective Patient reports that she did her exercises every day this past week. Patient states she still needs to do hold with one hand with the slow marching. Patient reports that she has had unsteadiness sensation this past week  that is more pronounced at different times. Patient states that at the end of gardening when she is tired she notices the unsteadiness the most.    Pertinent  History History obtained from patient report and medical history. Patient was seen at Urgent Care for sever right ear pain on 12/02/2020, and she began to experience dizziness/imbalance shortly afterwards. Patient was seen by Dr. Pryor Ochoa, ENT physician, on 02/09/21 and VNG testing was ordered. 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  Patient describes her dizziness as lightheadedness and sensation of head fullness, and patient states she frequently veers when walking. Patient denies vertigo. Patient reports when she is walking and at night when she sits still more are when she feels the lightheadedness sensation.  Patient reports that walking and working outside doing yard work aggravate her symptoms and nothing makes it better. Patient reports that she gets daily episodes of lightheadedness and imbalance that lasts seconds. Patient reports that one physician stated that she might have had shingles in the right ear.    Diagnostic tests VNG: 96% unilateral vestibular loss; MRI brain Feb 2022 revealed evidence of past stroke    Patient Stated Goals Patient would like to be able to garden and do yard work without dizziness/imbalance and patient would like to be able to ambulate without veering.           Neuromuscular Re-education:  VOR X 1 exercise:  Patient performed patient performed ambulation about 50 feet while performing VOR x1 with conflicting background four reps demonstrating good technique.   Patient required contact-guard assist. Patient denies dizziness, but reports unsteadiness with this activity.   Ambulation with head turns:  Patient performed 175' forwards ambulation while scanning for targets in hallway with contact-guard assist. Patient attempted to perform retroambulation without assistive device and she had a loss of balance requiring mod assist to correct. Patient performed 175 feet of retroambulation while using single-point cane while scanning for targets in hallway with contact-guard to min assist. Patient reports that these activities are challenging for her balance and that she has to concentrate more when performing retroambulation with head turns.  Patient denies dizziness but has evidence of imbalance with ambulation with head turning activities.  Airex balance beam: On Airex balance beam, performed static sideways stance with wide base of support progressing to narrow base support multiple 30-60 second holds with contact-guard assist. On Airex balance beam, performed sideways stance static holds with narrow base of support with horizontal and vertical head turns with  contact-guard assist multiple repetitions.  On Airex balance beam, performed sideways stepping with and without horizontal head turns 5' times 4 reps with contact-guard assist.  Patient is motivated to session and reports good compliance with home exercise program.  Patient demonstrated difficulty with forward and retro-ambulation with head turns. Patient required use of single-point cane for retro-ambulation with head turns this date and required contact-guard to min assistance.  Patient did well with ambulation while performing VOR x1 with conflicting background demonstrating good technique.  Patient challenged by all activities on the Airex balance beam and would benefit from repeated practice next session.  Patient continues to demonstrate Balance difficulties with uneven surfaces, activities with head turns, narrow base of support and single-leg stance activities.  Next session will plan on performing Airex balance beam activities, ambulation with head turns and will try rocker board activities.  Patient would benefit from continued PT services to further address balance deficits and goals as set on plan of care.     PT Short Term Goals - 05/03/21 1224      PT SHORT TERM GOAL #1   Title Pt will be independent with HEP  in order to improve balance and decrease dizziness-lightheadedness and imbalance symptoms in order to decrease fall risk and improve function at home and work.    Time 4    Period Weeks    Status Achieved    Target Date 04/05/21             PT Long Term Goals - 03/08/21 1908      PT LONG TERM GOAL #1   Title Patient will demonstrate reduced falls risk as evidenced by Dynamic Gait Index (DGI) >19/24.    Baseline scored 13/24 on 03/07/21    Time 12    Period Weeks    Status New    Target Date 05/30/21      PT LONG TERM GOAL #2   Title Patient will reduce falls risk as indicated by Activities Specific Balance Confidence Scale (ABC) >67%.    Baseline scored 21.8% on  03/07/21;    Time 12    Period Weeks    Status New    Target Date 05/30/21      PT LONG TERM GOAL #3   Title Patient will have improved FOTO score of 5 points or greater in order to demonstrate improvements in patient's ADLs and functional performance.    Baseline scored 65/100 on 03/07/21    Time 12    Period Weeks    Status New    Target Date 05/30/21      PT LONG TERM GOAL #4   Title Patient will report 50% or greater improvement in her symptoms of dizziness/lightheadness and imbalance with provoking motions or position    Time 12    Period Weeks    Status New    Target Date 05/30/21      PT LONG TERM GOAL #5   Title Patient will be able to ambulate 400' or greater on even and uneven surfaces without veering to be able to do yard work.    Baseline patient reports that she veers/bumps into the walls when walking about her home and that she uses a rake for support when moving about the yard due to imbalance on 03/07/21;    Time 12    Period Weeks    Status New    Target Date 05/30/21                 Plan - 05/03/21 1222    Clinical Impression Statement Patient is motivated to session and reports good compliance with home exercise program.  Patient demonstrated difficulty with forward and retro-ambulation with head turns. Patient required use of single-point cane for retro-ambulation with head turns this date and required contact-guard to min assistance.  Patient did well with ambulation while performing VOR x1 with conflicting background demonstrating good technique.  Patient challenged by all activities on the Airex balance beam and would benefit from repeated practice next session.  Patient continues to demonstrate Balance difficulties with uneven surfaces, activities with head turns, narrow base of support and single-leg stance activities.  Next session will plan on performing Airex balance beam activities, ambulation with head turns and will try rocker board activities.   Patient would benefit from continued PT services to further address balance deficits and goals as set on plan of care.    Personal Factors and Comorbidities Comorbidity 3+    Comorbidities depression, migraines, DM, HTN    Examination-Activity Limitations Bend;Locomotion Level;Stairs    Examination-Participation Restrictions Yard Work    Advice worker  Good    PT Frequency 1x / week    PT Duration 12 weeks    PT Treatment/Interventions Canalith Repostioning;Gait training;Stair training;Therapeutic activities;Therapeutic exercise;Balance training;Neuromuscular re-education;Patient/family education;Vestibular    PT Next Visit Plan review HEP and work on progressions, semi-tandem with head and body turns, ambulation with head turns, body wall rolls    PT Home Exercise Plan VOR X 1 horizontal in sitting with plain background 1 minute reps and feet together on firm surface with horizontal and vertical head turns    Consulted and Agree with Plan of Care Patient           Patient will benefit from skilled therapeutic intervention in order to improve the following deficits and impairments:  Decreased balance,Difficulty walking,Dizziness  Visit Diagnosis: Dizziness and giddiness     Problem List Patient Active Problem List   Diagnosis Date Noted  . Dizziness 12/28/2020  . 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/22/2020  . Chronic bilateral low back pain with left-sided sciatica 07/13/2020  . Lichen sclerosus 28/78/6767  . Vulvar irritation 04/09/2019  . Osteopenia 06/14/2018  . Hypothyroidism 04/26/2018  . 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  . Complex tear of lateral meniscus of right knee as current injury 08/03/2017  . Primary osteoarthritis of right knee 08/03/2017  . Advance directive discussed  with patient 03/13/2017  . Cataracts, bilateral 08/14/2016  . Rotator cuff tendinitis, right 08/14/2016  . Right kidney mass 08/07/2016  . Hyperlipidemia 08/07/2016  . Urge incontinence of urine 08/07/2016  . 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 Bladder cancer ([place])   . Controlled type 2 diabetes mellitus ([place])   . Depression   . Benign hypertensive renal disease   . PVD (peripheral vascular disease) ([place])    Lady Deutscher PT, DPT [phone] Lady Deutscher 05/03/2021, 12:44 PM  [place] [facility] SERVICES [address] [place], [place], [address] Phone: [phone]   Fax:  [phone]  Name: SALISA BROZ MRN: [id number] Date of Birth: [birth year]

## 2021-05-10 ENCOUNTER — Ambulatory Visit (INDEPENDENT_AMBULATORY_CARE_PROVIDER_SITE_OTHER): Payer: Medicare Other | Admitting: Physician Assistant

## 2021-05-10 ENCOUNTER — Encounter: Payer: Self-pay | Admitting: Physician Assistant

## 2021-05-10 ENCOUNTER — Other Ambulatory Visit: Payer: Self-pay

## 2021-05-10 ENCOUNTER — Encounter: Payer: Self-pay | Admitting: Physical Therapy

## 2021-05-10 ENCOUNTER — Ambulatory Visit: Payer: Medicare Other | Admitting: Physical Therapy

## 2021-05-10 VITALS — BP 134/67 | HR 66 | Ht 65.0 in | Wt 183.0 lb

## 2021-05-10 DIAGNOSIS — N3281 Overactive bladder: Secondary | ICD-10-CM | POA: Diagnosis not present

## 2021-05-10 DIAGNOSIS — R42 Dizziness and giddiness: Secondary | ICD-10-CM

## 2021-05-10 LAB — BLADDER SCAN AMB NON-IMAGING

## 2021-05-10 NOTE — Therapy (Signed)
[place] MAIN [facility] SERVICES [address] [place], [place], [address] Phone: [phone]   Fax:  [phone]  Physical Therapy Treatment  Patient Details  Name: Stacy Mercado MRN: [id number] Date of Birth: 05-16-1946 Referring Provider (PT): Dr. Pryor Ochoa   Encounter Date: 05/10/2021   PT End of Session - 05/10/21 1041    Visit Number 5    Number of Visits 13    Date for PT Re-Evaluation 05/30/21    PT Start Time 1018    PT End Time 1105   PT Time Calculation (min) 47 min   Equipment Utilized During Treatment Gait belt    Activity Tolerance Patient tolerated treatment well    Behavior During Therapy [facility] for tasks assessed/performed           Past Medical History:  Diagnosis Date  . Arthritis   . Bladder cancer ([place]) 2017  . Chronic low back pain   . Depression   . Diabetes mellitus without complication ([place])   . History of blood transfusion   . History of iron deficiency anemia   . History of pleurisy   . History of pneumonia   . Hyperlipidemia 08/07/2016  . Hypertension   . Hypothyroidism   . Migraines   . Personal history of chemotherapy    Bladder Ca  . Urge incontinence of urine 08/07/2016    Past Surgical History:  Procedure Laterality Date  . ABDOMINAL HYSTERECTOMY    . APPENDECTOMY  1961  . bladder cancer removed  2014  . BOTOX INJECTION N/A 11/08/2020   Procedure: BOTOX INJECTION;  Surgeon: Hollice Espy, MD;  Location: [facility] ORS;  Service: Urology;  Laterality: N/A;  . CATARACT EXTRACTION W/ INTRAOCULAR LENS  IMPLANT, BILATERAL    . complete hysterectomy  1999  . DILATION AND CURETTAGE OF UTERUS  1999  . IR RADIOLOGIST EVAL & MGMT  07/20/2020  . IR RADIOLOGIST EVAL & MGMT  10/19/2020  . IR RADIOLOGIST EVAL & MGMT  12/02/2020  . IR RADIOLOGIST EVAL & MGMT  03/15/2021  . L rotator cuff Left 2010  . R thumb ruptured ligament and tendon  2012  . RADIOLOGY WITH ANESTHESIA Right 10/27/2020   Procedure: IR WITH  ANESTHESIA RIGHT RENAL CRYOABLATION;  Surgeon: Sandi Mariscal, MD;  Location: [facility];  Service: Anesthesiology;  Laterality: Right;  . REVERSE SHOULDER ARTHROPLASTY Right 11/20/2017   Procedure: REVERSE SHOULDER ARTHROPLASTY;  Surgeon: Corky Mull, MD;  Location: [facility] ORS;  Service: Orthopedics;  Laterality: Right;  . SHOULDER ARTHROSCOPY WITH BICEPSTENOTOMY Right 01/30/2017   Procedure: SHOULDER ARTHROSCOPY WITH BICEPSTENOTOMY;  Surgeon: Corky Mull, MD;  Location: [facility] ORS;  Service: Orthopedics;  Laterality: Right;  biceps tenolysis  . SHOULDER ARTHROSCOPY WITH OPEN ROTATOR CUFF REPAIR Right 01/30/2017   Procedure: SHOULDER ARTHROSCOPY WITH OPEN ROTATOR CUFF REPAIR;  Surgeon: Corky Mull, MD;  Location: [facility] ORS;  Service: Orthopedics;  Laterality: Right;  Massive rotator cuff tear repair    There were no vitals filed for this visit.   Subjective Assessment - 05/10/21 1040    Subjective Patient reports she has been doing her home exercises and that the slow marching is getting better.    Pertinent History History obtained from patient report and medical history. Patient was seen at Urgent Care for sever right ear pain on 12/02/2020, and she began to experience dizziness/imbalance shortly afterwards. Patient was seen by Dr. Pryor Ochoa, ENT physician, on 02/09/21 and VNG testing was ordered. 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  Patient describes her dizziness as lightheadedness and sensation of head fullness, and patient states she frequently veers when walking. Patient denies vertigo. Patient reports when she is walking and at night when she sits still more are when she feels the lightheadedness sensation. Patient reports that walking and working outside doing yard work aggravate her symptoms and nothing makes it better. Patient reports that she gets daily episodes of lightheadedness and imbalance that lasts seconds. Patient reports that one physician stated that she  might have had shingles in the right ear.    Diagnostic tests VNG: 96% unilateral vestibular loss; MRI brain Feb 2022 revealed evidence of past stroke    Patient Stated Goals Patient would like to be able to garden and do yard work without dizziness/imbalance and patient would like to be able to ambulate without veering.    Currently in Pain? No/denies           Neuromuscular Re-education:  Rockerboard: On small wooden rocker board, worked on side to side and anterior/posterior sways with and without horizontal and vertical head turns with CGA. On the smaller rockerboard, able to progress to no hands with head turns horizontal with horizontal sways and vertical head turns with vertical sways. On medium rockerboard, patient performed side to side and anterior/posterior sways without head turns. Patient was able to progress to no UEs assist with CGA.  Ambulation with head turns:  Patient performed 175' forwards and retro ambulation with SPC while scanning for targets in hallway with CGA. Patient had several small losses of balance that she was able to self-correct. Patient decreased gait speed with retro ambulation.   Cone tapping: On firm surface, patient performed foot tapping to cones in series of one, two and then three targets as called out by therapist with CGA at ballet bar.  Patient occasionally touching ballet bar for support due to losses of balance.   Slow Marching: Patient demonstrated slow marching as she has been practicing this daily as part of her HEP. Patient was able to perform marching without using UEs for balance this date, but only able to hold for 1-2 seconds. This is a big improvement though as compared to prior sessions where she needed one hand support to be able to perform this activity.      PT Education - 05/10/21 1040    Education Provided Exercise/balance techniques during session   Person(s) Educated Patient    Methods Explanation    Comprehension  Verbalized understanding            PT Short Term Goals - 05/03/21 1224      PT SHORT TERM GOAL #1   Title Pt will be independent with HEP in order to improve balance and decrease dizziness-lightheadedness and imbalance symptoms in order to decrease fall risk and improve function at home and work.    Time 4    Period Weeks    Status Achieved    Target Date 04/05/21             PT Long Term Goals - 03/08/21 1908      PT LONG TERM GOAL #1   Title Patient will demonstrate reduced falls risk as evidenced by Dynamic Gait Index (DGI) >19/24.    Baseline scored 13/24 on 03/07/21    Time 12    Period Weeks    Status New    Target Date 05/30/21      PT LONG TERM GOAL #2  Title Patient will reduce falls risk as indicated by Activities Specific Balance Confidence Scale (ABC) >67%.    Baseline scored 21.8% on 03/07/21;    Time 12    Period Weeks    Status New    Target Date 05/30/21      PT LONG TERM GOAL #3   Title Patient will have improved FOTO score of 5 points or greater in order to demonstrate improvements in patient's ADLs and functional performance.    Baseline scored 65/100 on 03/07/21    Time 12    Period Weeks    Status New    Target Date 05/30/21      PT LONG TERM GOAL #4   Title Patient will report 50% or greater improvement in her symptoms of dizziness/lightheadness and imbalance with provoking motions or position    Time 12    Period Weeks    Status New    Target Date 05/30/21      PT LONG TERM GOAL #5   Title Patient will be able to ambulate 400' or greater on even and uneven surfaces without veering to be able to do yard work.    Baseline patient reports that she veers/bumps into the walls when walking about her home and that she uses a rake for support when moving about the yard due to imbalance on 03/07/21;    Time 12    Period Weeks    Status New    Target Date 05/30/21              Plan - 05/12/21 1520    Clinical Impression Statement Patient  continues to make progress with therapy. Patient was able to demonstrate marching without using UEs for balance this date, but only able to hold for 1-2 seconds.  This is a big improvement though as compared to prior sessions where she needed one hand support to be able to perform this activity. Patient did well with activities on small rocker board but was challenged by medium rockerboard activities and she was demonstrating good use of ankle and hip strategies to try to correct losses of balance. She had a more difficult time with trying to use hip strategies on medium rocker board and would benefit from continued practice next session. Patient also continues to improve with cone tapping activity. Will try to progress to standing on Airex pad while performing cone tapping next session. Patient would benefit from continued PT services to further address goals and to try to decrease patient's falls risk.    Personal Factors and Comorbidities Comorbidity 3+    Comorbidities depression, migraines, DM, HTN    Examination-Activity Limitations Bend;Locomotion Level;Stairs    Examination-Participation Restrictions Yard Work    [employer] Evolving/Moderate complexity    Rehab Potential Good    PT Frequency 1x / week    PT Duration 12 weeks    PT Treatment/Interventions Canalith Repostioning;Gait training;Stair training;Therapeutic activities;Therapeutic exercise;Balance training;Neuromuscular re-education;Patient/family education;Vestibular    PT Next Visit Plan review HEP and work on progressions, semi-tandem with head and body turns, ambulation with head turns, body wall rolls    PT Home Exercise Plan VOR X 1 horizontal in sitting with plain background 1 minute reps and feet together on firm surface with horizontal and vertical head turns    Consulted and Agree with Plan of Care Patient                 Patient will benefit from skilled therapeutic intervention in order to  improve the following deficits and impairments:     Visit Diagnosis: Dizziness and giddiness     Problem List Patient Active Problem List   Diagnosis Date Noted  . Dizziness 12/28/2020  . 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/22/2020  . Chronic bilateral low back pain with left-sided sciatica 07/13/2020  . Lichen sclerosus 57/49/3552  . Vulvar irritation 04/09/2019  . Osteopenia 06/14/2018  . Hypothyroidism 04/26/2018  . 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  . Complex tear of lateral meniscus of right knee as current injury 08/03/2017  . Primary osteoarthritis of right knee 08/03/2017  . Advance directive discussed with patient 03/13/2017  . Cataracts, bilateral 08/14/2016  . Rotator cuff tendinitis, right 08/14/2016  . Right kidney mass 08/07/2016  . Hyperlipidemia 08/07/2016  . Urge incontinence of urine 08/07/2016  . 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 Bladder cancer ([place])   . Controlled type 2 diabetes mellitus ([place])   . Depression   . Benign hypertensive renal disease   . PVD (peripheral vascular disease) ([place])    Lady Deutscher PT, DPT [phone] Lady Deutscher 05/10/2021, 10:41 AM  [place] MAIN [facility] SERVICES [address] [place], [place], [address] Phone: [phone]   Fax:  [phone]  Name: KAILEN NAME MRN: [id number] Date of Birth: 1946/11/08

## 2021-05-10 NOTE — Progress Notes (Signed)
05/10/2021 3:03 PM   Stacy Merc[place] [birth year] [id number]  CC: Chief Complaint  Patient presents with  . Over Active Bladder    HPI: Stacy Mercado is a 75 y.o. 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  Today she reports increased urinary control, decreased urgency, no daytime frequency, and nocturia x2-3.  She continues to have some urge incontinence if she waits too long to void.  She denies lower abdominal discomfort or difficulty urinating.  In-office UA today positive for 1+ leukocyte esterase; urine microscopy with calcium oxalate crystals and moderate bacteria. PVR 2102mL.  PMH: Past Medical History:  Diagnosis Date  . Arthritis   . Bladder cancer (Stacy Mercado) 2017  . Chronic low back pain   . Depression   . Diabetes mellitus without complication (Stacy Mercado)   . History of blood transfusion   . History of iron deficiency anemia   . History of pleurisy   . History of pneumonia   . Hyperlipidemia 08/07/2016  . Hypertension   . Hypothyroidism   . Migraines   . Personal history of chemotherapy    Bladder Ca  . Urge incontinence of urine 08/07/2016    Surgical History: Past Surgical History:  Procedure Laterality Date  . ABDOMINAL HYSTERECTOMY    . APPENDECTOMY  1961  . bladder cancer removed  2014  . BOTOX INJECTION N/A 11/08/2020   Procedure: BOTOX INJECTION;  Surgeon: Hollice Espy, MD;  Location: [facility] ORS;  Service: Urology;  Laterality: N/A;  . CATARACT EXTRACTION W/ INTRAOCULAR LENS  IMPLANT, BILATERAL    . complete hysterectomy  1999  . DILATION AND CURETTAGE OF UTERUS  1999  . IR RADIOLOGIST EVAL & MGMT  07/20/2020  . IR RADIOLOGIST EVAL & MGMT  10/19/2020  . IR RADIOLOGIST EVAL & MGMT  12/02/2020  . IR RADIOLOGIST EVAL & MGMT  03/15/2021   . L rotator cuff Left 2010  . R thumb ruptured ligament and tendon  2012  . RADIOLOGY WITH ANESTHESIA Right 10/27/2020   Procedure: IR WITH ANESTHESIA RIGHT RENAL CRYOABLATION;  Surgeon: Sandi Mariscal, MD;  Location: [facility];  Service: Anesthesiology;  Laterality: Right;  . REVERSE SHOULDER ARTHROPLASTY Right 11/20/2017   Procedure: REVERSE SHOULDER ARTHROPLASTY;  Surgeon: Corky Mull, MD;  Location: [facility] ORS;  Service: Orthopedics;  Laterality: Right;  . SHOULDER ARTHROSCOPY WITH BICEPSTENOTOMY Right 01/30/2017   Procedure: SHOULDER ARTHROSCOPY WITH BICEPSTENOTOMY;  Surgeon: Corky Mull, MD;  Location: [facility] ORS;  Service: Orthopedics;  Laterality: Right;  biceps tenolysis  . SHOULDER ARTHROSCOPY WITH OPEN ROTATOR CUFF REPAIR Right 01/30/2017   Procedure: SHOULDER ARTHROSCOPY WITH OPEN ROTATOR CUFF REPAIR;  Surgeon: Corky Mull, MD;  Location: [facility] ORS;  Service: Orthopedics;  Laterality: Right;  Massive rotator cuff tear repair    Home Medications:  Allergies as of 05/10/2021      Reactions   Statins Other (See Comments)   myalgia      Medication List       Accurate as of May 10, 2021  3:03 PM. If you have any questions, ask your nurse or doctor.        aspirin 81 MG chewable tablet Chew 81 mg by mouth daily.   baclofen 10 MG tablet Commonly known as: LIORESAL Take 1 tablet (10 mg total) by mouth  at bedtime. What changed:   when to take this  reasons to take this   escitalopram 10 MG tablet Commonly known as: LEXAPRO Take 1 tablet (10 mg total) by mouth daily.   gabapentin 300 MG capsule Commonly known as: NEURONTIN TAKE 1 CAPSULE(300 MG) BY MOUTH AT BEDTIME   HYDROcodone-acetaminophen 5-325 MG tablet Commonly known as: NORCO/VICODIN Take 1-2 tablets by mouth every 6 (six) hours as needed for moderate pain.   ICY HOT LIDOCAINE PLUS MENTHOL EX Apply 1 application topically 2 (two) times daily as needed (muscle pain).   levothyroxine 50 MCG tablet Commonly known  as: SYNTHROID TAKE 1 TABLET BY MOUTH DAILY BEFORE BREAKFAST   losartan 25 MG tablet Commonly known as: COZAAR Take 1 tablet (25 mg total) by mouth daily.   metFORMIN 500 MG 24 hr tablet Commonly known as: GLUCOPHAGE-XR TAKE 2 TABLETS(1000 MG) BY MOUTH IN THE MORNING AND AT BEDTIME   mirabegron ER 50 MG Tb24 tablet Commonly known as: [name] Take 1 tablet (50 mg total) by mouth daily.   montelukast 10 MG tablet Commonly known as: SINGULAIR Take 1 tablet (10 mg total) by mouth at bedtime.   multivitamin with minerals Tabs tablet Take 1 tablet by mouth daily.   naproxen 500 MG tablet Commonly known as: NAPROSYN Take 1 tablet (500 mg total) by mouth 2 (two) times daily with a meal.       Allergies:  Allergies  Allergen Reactions  . Statins Other (See Comments)    myalgia    Family History: Family History  Problem Relation Age of Onset  . Hypertension Brother   . Diabetes Brother   . Healthy Son   . Healthy Son   . Healthy Daughter   . Kidney cancer Neg Hx   . Bladder Cancer Neg Hx   . Breast cancer Neg Hx     Social History:   reports that she quit smoking about 14 years ago. Her smoking use included cigarettes. She quit after 50.00 years of use. She has never used smokeless tobacco. She reports current alcohol use of about 7.0 standard drinks of alcohol per week. She reports that she does not use drugs.  Physical Exam: BP 134/67   Pulse 66   Ht 5\' 5"  (1.651 m)   Wt 183 lb (83 kg)   BMI 30.45 kg/m   Constitutional:  Alert and oriented, no acute distress, nontoxic appearing HEENT: [place], AT Cardiovascular: No clubbing, cyanosis, or edema Respiratory: Normal respiratory effort, no increased work of breathing Skin: No rashes, bruises or suspicious lesions Neurologic: Grossly intact, no focal deficits, moving all 4 extremities Psychiatric: Normal mood and affect  Laboratory Data: Results for orders placed or performed in visit on 05/10/21  Microscopic  Examination   Urine  Result Value Ref Range   WBC, UA 0-5 0 - 5 /hpf   RBC None seen 0 - 2 /hpf   Epithelial Cells (non renal) 0-10 0 - 10 /hpf   Crystals Present (A) N/A   Crystal Type Calcium Oxalate N/A   Bacteria, UA Moderate (A) None seen/Few  Urinalysis, Complete  Result Value Ref Range   Specific Gravity, UA 1.015 1.005 - 1.030   pH, UA 5.5 5.0 - 7.5   Color, UA Yellow Yellow   Appearance Ur Hazy (A) Clear   Leukocytes,UA 1+ (A) Negative   Protein,UA Negative Negative/Trace   Glucose, UA Negative Negative   Ketones, UA Negative Negative   RBC, UA Negative Negative   Bilirubin, UA  Negative Negative   Urobilinogen, Ur 0.2 0.2 - 1.0 mg/dL   Nitrite, UA Negative Negative   Microscopic Examination See below:   Bladder Scan (Post Void Residual) in office  Result Value Ref Range   Scan Result 216mL    Assessment & Plan:   1. OAB (overactive bladder) Asymptomatic incomplete bladder emptying following intravesical Botox, no indication for CIC at this time.  We will plan for symptom recheck and repeat treatment scheduling in approximately 5 months.  Patient is in agreement with this plan [name] samples also provided today. - Urinalysis, Complete; Future - Bladder Scan (Post Void Residual) in office - Urinalysis, Complete  Return in about 5 months (around 10/10/2021) for Post-Botox sx recheck and scheduling.  Debroah Loop, PA-C  [facility] [address], [place], [place] [address] [phone]

## 2021-05-12 LAB — MICROSCOPIC EXAMINATION: RBC, Urine: NONE SEEN /hpf (ref 0–2)

## 2021-05-12 LAB — URINALYSIS, COMPLETE
Bilirubin, UA: NEGATIVE
Glucose, UA: NEGATIVE
Ketones, UA: NEGATIVE
Nitrite, UA: NEGATIVE
Protein,UA: NEGATIVE
RBC, UA: NEGATIVE
Specific Gravity, UA: 1.015 (ref 1.005–1.030)
Urobilinogen, Ur: 0.2 mg/dL (ref 0.2–1.0)
pH, UA: 5.5 (ref 5.0–7.5)

## 2021-05-12 NOTE — Patient Instructions (Addendum)
Visit Information  It was a pleasure speaking with you today. Thank you for letting me be part of your clinical team. Please call with any questions or concerns.   Goals Addressed            This Visit's Progress   . Manage Chronic Pain       Timeframe:  Long-Range Goal Priority:  High Start Date:                             Expected End Date:                       Follow Up Date 2 month follow up    - call for medicine refill 2 or 3 days before it runs out - develop a personal pain management plan - keep track of prescription refills - plan exercise or activity when pain is best controlled - prioritize tasks for the day - use ice or heat for pain relief    Why is this important?    Day-to-day life can be hard when you have chronic pain.   Pain medicine is just one piece of the treatment puzzle.   You can try these action steps to help you manage your pain.    Notes:     . Track and Manage My Blood Pressure-Hypertension       Timeframe:  Long-Range Goal Priority:  Medium Start Date:                             Expected End Date:                       Follow Up Date 2 month follow up    - check blood pressure 3 times per week - write blood pressure results in a log or diary    Why is this important?    You won't feel high blood pressure, but it can still hurt your blood vessels.   High blood pressure can cause heart or kidney problems. It can also cause a stroke.   Making lifestyle changes like losing a little weight or eating less salt will help.   Checking your blood pressure at home and at different times of the day can help to control blood pressure.   If the doctor prescribes medicine remember to take it the way the doctor ordered.   Call the office if you cannot afford the medicine or if there are questions about it.     Notes:        The patient verbalized understanding of instructions, educational materials, and care plan provided today and  agreed to receive a mailed copy of patient instructions, educational materials, and care plan.   Telephone follow up appointment with pharmacy team member scheduled for: 2 months  Junita Push. Versie Fleener PharmD, BCPS Clinical Pharmacist [phone]  Managing Your Hypertension Hypertension, also called high blood pressure, is when the force of the blood pressing against the walls of the arteries is too strong. Arteries are blood vessels that carry blood from your heart throughout your body. Hypertension forces the heart to work harder to pump blood and may cause the arteries to become narrow or stiff. Understanding blood pressure readings Your personal target blood pressure may vary depending on your medical conditions, your age, and other factors. A blood pressure reading includes  a higher number over a lower number. Ideally, your blood pressure should be below 120/80. You should know that:  The first, or top, number is called the systolic pressure. It is a measure of the pressure in your arteries as your heart beats.  The second, or bottom number, is called the diastolic pressure. It is a measure of the pressure in your arteries as the heart relaxes. Blood pressure is classified into four stages. Based on your blood pressure reading, your health care provider may use the following stages to determine what type of treatment you need, if any. Systolic pressure and diastolic pressure are measured in a unit called mmHg. Normal  Systolic pressure: below 191.  Diastolic pressure: below 80. Elevated  Systolic pressure: 478-295.  Diastolic pressure: below 80. Hypertension stage 1  Systolic pressure: 621-308.  Diastolic pressure: 65-78. Hypertension stage 2  Systolic pressure: 469 or above.  Diastolic pressure: 90 or above. How can this condition affect me? Managing your hypertension is an important responsibility. Over time, hypertension can damage the arteries and decrease blood flow to  important parts of the body, including the brain, heart, and kidneys. Having untreated or uncontrolled hypertension can lead to:  A heart attack.  A stroke.  A weakened blood vessel (aneurysm).  Heart failure.  Kidney damage.  Eye damage.  Metabolic syndrome.  Memory and concentration problems.  Vascular dementia. What actions can I take to manage this condition? Hypertension can be managed by making lifestyle changes and possibly by taking medicines. Your health care provider will help you make a plan to bring your blood pressure within a normal range. Nutrition  Eat a diet that is high in fiber and potassium, and low in salt (sodium), added sugar, and fat. An example eating plan is called the Dietary Approaches to Stop Hypertension (DASH) diet. To eat this way: ? Eat plenty of fresh fruits and vegetables. Try to fill one-half of your plate at each meal with fruits and vegetables. ? Eat whole grains, such as whole-wheat pasta, brown rice, or whole-grain bread. Fill about one-fourth of your plate with whole grains. ? Eat low-fat dairy products. ? Avoid fatty cuts of meat, processed or cured meats, and poultry with skin. Fill about one-fourth of your plate with lean proteins such as fish, chicken without skin, beans, eggs, and tofu. ? Avoid pre-made and processed foods. These tend to be higher in sodium, added sugar, and fat.  Reduce your daily sodium intake. Most people with hypertension should eat less than 1,500 mg of sodium a day.   Lifestyle  Work with your health care provider to maintain a healthy body weight or to lose weight. Ask what an ideal weight is for you.  Get at least 30 minutes of exercise that causes your heart to beat faster (aerobic exercise) most days of the week. Activities may include walking, swimming, or biking.  Include exercise to strengthen your muscles (resistance exercise), such as weight lifting, as part of your weekly exercise routine. Try to do  these types of exercises for 30 minutes at least 3 days a week.  Do not use any products that contain nicotine or tobacco, such as cigarettes, e-cigarettes, and chewing tobacco. If you need help quitting, ask your health care provider.  Control any long-term (chronic) conditions you have, such as high cholesterol or diabetes.  Identify your sources of stress and find ways to manage stress. This may include meditation, deep breathing, or making time for fun activities.   Alcohol  use  Do not drink alcohol if: ? Your health care provider tells you not to drink. ? You are pregnant, may be pregnant, or are planning to become pregnant.  If you drink alcohol: ? Limit how much you use to:  0-1 drink a day for women.  0-2 drinks a day for men. ? Be aware of how much alcohol is in your drink. In the [place] one drink equals one 12 oz bottle of beer (355 mL), one 5 oz glass of wine (148 mL), or one 1 oz glass of hard liquor (44 mL). Medicines Your health care provider may prescribe medicine if lifestyle changes are not enough to get your blood pressure under control and if:  Your systolic blood pressure is 130 or higher.  Your diastolic blood pressure is 80 or higher. Take medicines only as told by your health care provider. Follow the directions carefully. Blood pressure medicines must be taken as told by your health care provider. The medicine does not work as well when you skip doses. Skipping doses also puts you at risk for problems. Monitoring Before you monitor your blood pressure:  Do not smoke, drink caffeinated beverages, or exercise within 30 minutes before taking a measurement.  Use the bathroom and empty your bladder (urinate).  Sit quietly for at least 5 minutes before taking measurements. Monitor your blood pressure at home as told by your health care provider. To do this:  Sit with your back straight and supported.  Place your feet flat on the floor. Do not cross your  legs.  Support your arm on a flat surface, such as a table. Make sure your upper arm is at heart level.  Each time you measure, take two or three readings one minute apart and record the results. You may also need to have your blood pressure checked regularly by your health care provider.   General information  Talk with your health care provider about your diet, exercise habits, and other lifestyle factors that may be contributing to hypertension.  Review all the medicines you take with your health care provider because there may be side effects or interactions.  Keep all visits as told by your health care provider. Your health care provider can help you create and adjust your plan for managing your high blood pressure. Where to find more information  National Heart, Lung, and Blood Institute: https://wilson-eaton.com/  American Heart Association: www.heart.org Contact a health care provider if:  You think you are having a reaction to medicines you have taken.  You have repeated (recurrent) headaches.  You feel dizzy.  You have swelling in your ankles.  You have trouble with your vision. Get help right away if:  You develop a severe headache or confusion.  You have unusual weakness or numbness, or you feel faint.  You have severe pain in your chest or abdomen.  You vomit repeatedly.  You have trouble breathing. These symptoms may represent a serious problem that is an emergency. Do not wait to see if the symptoms will go away. Get medical help right away. Call your local emergency services (911 in the [place] Do not drive yourself to the hospital. Summary  Hypertension is when the force of blood pumping through your arteries is too strong. If this condition is not controlled, it may put you at risk for serious complications.  Your personal target blood pressure may vary depending on your medical conditions, your age, and other factors. For most people, a normal blood pressure  is  less than 120/80.  Hypertension is managed by lifestyle changes, medicines, or both.  Lifestyle changes to help manage hypertension include losing weight, eating a healthy, low-sodium diet, exercising more, stopping smoking, and limiting alcohol. This information is not intended to replace advice given to you by your health care provider. Make sure you discuss any questions you have with your health care provider. Document Revised: 01/09/2020 Document Reviewed: 11/04/2019 Elsevier Patient Education  2021 [employer].

## 2021-05-18 ENCOUNTER — Ambulatory Visit: Payer: Medicare Other | Attending: Otolaryngology | Admitting: Physical Therapy

## 2021-05-18 ENCOUNTER — Encounter: Payer: Self-pay | Admitting: Physical Therapy

## 2021-05-18 ENCOUNTER — Telehealth: Payer: Self-pay | Admitting: Pharmacist

## 2021-05-18 ENCOUNTER — Other Ambulatory Visit: Payer: Self-pay

## 2021-05-18 DIAGNOSIS — R42 Dizziness and giddiness: Secondary | ICD-10-CM | POA: Insufficient documentation

## 2021-05-18 NOTE — Therapy (Signed)
[place] MAIN [facility] SERVICES [address] [place], [place], [address] Phone: [phone]   Fax:  [phone]  Physical Therapy Treatment  Patient Details  Name: Stacy Mercado MRN: [id number] Date of Birth: 25-Sep-1946 Referring Provider (PT): Dr. Pryor Ochoa   Encounter Date: 05/18/2021   PT End of Session - 05/18/21 1259    Visit Number 6    Number of Visits 13    Date for PT Re-Evaluation 05/30/21    PT Start Time 1300    PT Stop Time 1358    PT Time Calculation (min) 58 min    Equipment Utilized During Treatment Gait belt    Activity Tolerance Patient tolerated treatment well    Behavior During Therapy [facility] for tasks assessed/performed           Past Medical History:  Diagnosis Date  . Arthritis   . Bladder cancer ([place]) 2017  . Chronic low back pain   . Depression   . Diabetes mellitus without complication ([place])   . History of blood transfusion   . History of iron deficiency anemia   . History of pleurisy   . History of pneumonia   . Hyperlipidemia 08/07/2016  . Hypertension   . Hypothyroidism   . Migraines   . Personal history of chemotherapy    Bladder Ca  . Urge incontinence of urine 08/07/2016    Past Surgical History:  Procedure Laterality Date  . ABDOMINAL HYSTERECTOMY    . APPENDECTOMY  1961  . bladder cancer removed  2014  . BOTOX INJECTION N/A 11/08/2020   Procedure: BOTOX INJECTION;  Surgeon: Hollice Espy, MD;  Location: [facility] ORS;  Service: Urology;  Laterality: N/A;  . CATARACT EXTRACTION W/ INTRAOCULAR LENS  IMPLANT, BILATERAL    . complete hysterectomy  1999  . DILATION AND CURETTAGE OF UTERUS  1999  . IR RADIOLOGIST EVAL & MGMT  07/20/2020  . IR RADIOLOGIST EVAL & MGMT  10/19/2020  . IR RADIOLOGIST EVAL & MGMT  12/02/2020  . IR RADIOLOGIST EVAL & MGMT  03/15/2021  . L rotator cuff Left 2010  . R thumb ruptured ligament and tendon  2012  . RADIOLOGY WITH ANESTHESIA Right 10/27/2020   Procedure: IR WITH  ANESTHESIA RIGHT RENAL CRYOABLATION;  Surgeon: Sandi Mariscal, MD;  Location: [facility] ORS;  Service: Anesthesiology;  Laterality: Right;  . REVERSE SHOULDER ARTHROPLASTY Right 11/20/2017   Procedure: REVERSE SHOULDER ARTHROPLASTY;  Surgeon: Corky Mull, MD;  Location: [facility] ORS;  Service: Orthopedics;  Laterality: Right;  . SHOULDER ARTHROSCOPY WITH BICEPSTENOTOMY Right 01/30/2017   Procedure: SHOULDER ARTHROSCOPY WITH BICEPSTENOTOMY;  Surgeon: Corky Mull, MD;  Location: [facility] ORS;  Service: Orthopedics;  Laterality: Right;  biceps tenolysis  . SHOULDER ARTHROSCOPY WITH OPEN ROTATOR CUFF REPAIR Right 01/30/2017   Procedure: SHOULDER ARTHROSCOPY WITH OPEN ROTATOR CUFF REPAIR;  Surgeon: Corky Mull, MD;  Location: [facility] ORS;  Service: Orthopedics;  Laterality: Right;  Massive rotator cuff tear repair    There were no vitals filed for this visit.   Subjective Assessment - 05/18/21 1306    Subjective Patient reports she did well this past week, but states she did have increased symptoms over the weekend and when asked patient reports she had been doing more activity such as gardening. Patient states she did the exercises and is getting better.    Pertinent History History obtained from patient report and medical history. Patient was seen at Urgent Care for sever right ear pain on 12/02/2020,  and she began to experience dizziness/imbalance shortly afterwards. Patient was seen by Dr. Pryor Ochoa, ENT physician, on 02/09/21 and VNG testing was ordered. 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  Patient describes her dizziness as lightheadedness and sensation of head fullness, and patient states she frequently veers when walking. Patient denies vertigo. Patient reports when she is walking and at night when she sits still more are when she feels the lightheadedness sensation. Patient reports that walking and working outside doing yard work aggravate her symptoms and nothing makes it better.  Patient reports that she gets daily episodes of lightheadedness and imbalance that lasts seconds. Patient reports that one physician stated that she might have had shingles in the right ear.    Diagnostic tests VNG: 96% unilateral vestibular loss; MRI brain Feb 2022 revealed evidence of past stroke    Patient Stated Goals Patient would like to be able to garden and do yard work without dizziness/imbalance and patient would like to be able to ambulate without veering.          Neuromuscular Re-education:  Body Wall Rolls:  Patient performed 6 reps of supported, body wall rolls with eyes open with CGA.  Patient reports mild dizziness with this activity.   Hallway ball toss:  In hallway, worked on ball toss against one wall with alternating quick turns to toss ball against opposite wall while tracking with eyes and head with CGA. Patient reports 3/10 dizziness with this activity.   Ambulation with head turns:  Patient performed 1 rep of 175' forwards ambulation while scanning for targets in hallway with CGA.  Patient performed 175' trials of forwards and retro ambulation with diagonal head turns and then repeated with self selected random- horizontal, vertical and diagonal head turns with CGA to Min A except for patient did have one loss of balance requiring mod A to correct.  Patient demonstrates uneven steppage and evidence of imbalance with this activity.   Ball toss over shoulder: Patient performed multiple 175' trials of forward ambulation while tossing ball over one shoulder with return catch over opposite shoulder with CGA.  Patient reports increase in dizziness with this activity and rates it as 5-6/10.      PT Education - 05/18/21 1258    Education Details Added body wall rolls to HEP; discussed community balance programs such as Chief of Staff at [employer] and at [employer]) Educated Patient    Methods Explanation;Handout;Verbal cues;Demonstration    Comprehension  Verbalized understanding;Returned demonstration            PT Short Term Goals - 05/03/21 1224      PT SHORT TERM GOAL #1   Title Pt will be independent with HEP in order to improve balance and decrease dizziness-lightheadedness and imbalance symptoms in order to decrease fall risk and improve function at home and work.    Time 4    Period Weeks    Status Achieved    Target Date 04/05/21             PT Long Term Goals - 03/08/21 1908      PT LONG TERM GOAL #1   Title Patient will demonstrate reduced falls risk as evidenced by Dynamic Gait Index (DGI) >19/24.    Baseline scored 13/24 on 03/07/21    Time 12    Period Weeks    Status New    Target Date 05/30/21      PT LONG TERM GOAL #2  Title Patient will reduce falls risk as indicated by Activities Specific Balance Confidence Scale (ABC) >67%.    Baseline scored 21.8% on 03/07/21;    Time 12    Period Weeks    Status New    Target Date 05/30/21      PT LONG TERM GOAL #3   Title Patient will have improved FOTO score of 5 points or greater in order to demonstrate improvements in patient's ADLs and functional performance.    Baseline scored 65/100 on 03/07/21    Time 12    Period Weeks    Status New    Target Date 05/30/21      PT LONG TERM GOAL #4   Title Patient will report 50% or greater improvement in her symptoms of dizziness/lightheadness and imbalance with provoking motions or position    Time 12    Period Weeks    Status New    Target Date 05/30/21      PT LONG TERM GOAL #5   Title Patient will be able to ambulate 400' or greater on even and uneven surfaces without veering to be able to do yard work.    Baseline patient reports that she veers/bumps into the walls when walking about her home and that she uses a rake for support when moving about the yard due to imbalance on 03/07/21;    Time 12    Period Weeks    Status New    Target Date 05/30/21                 Plan - 05/19/21 1014    Clinical  Impression Statement Patient improved with ability to perform body wall rolls this date and was able to add this exercise to patient's home exercise program. Patient reported reproduction of symptoms with hallway ball toss and ambulation with head turns and ball toss over shoulder exercises this date. Patient's balance was challenged by the ambulation activities with head turns and ball toss over shoulder and would benefit from continued practice in future sessions. Will plan on repeating functional outcome testing measures and updating goals next session.    Personal Factors and Comorbidities Comorbidity 3+    Comorbidities depression, migraines, DM, HTN    Examination-Activity Limitations Bend;Locomotion Level;Stairs    Examination-Participation Restrictions Yard Work    [employer] officer Evolving/Moderate complexity    Rehab Potential Good    PT Frequency 1x / week    PT Duration 12 weeks    PT Treatment/Interventions Canalith Repostioning;Gait training;Stair training;Therapeutic activities;Therapeutic exercise;Balance training;Neuromuscular re-education;Patient/family education;Vestibular    PT Next Visit Plan review HEP and work on progressions, semi-tandem with head and body turns, ambulation with head turns, body wall rolls    PT Home Exercise Plan VOR X 1 horizontal in sitting with plain background 1 minute reps and feet together on firm surface with horizontal and vertical head turns    Consulted and Agree with Plan of Care Patient           Patient will benefit from skilled therapeutic intervention in order to improve the following deficits and impairments:  Decreased balance,Difficulty walking,Dizziness  Visit Diagnosis: Dizziness and giddiness     Problem List Patient Active Problem List   Diagnosis Date Noted  . Dizziness 12/28/2020  . 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/22/2020  . Chronic  bilateral low back pain with left-sided sciatica 07/13/2020  . Lichen sclerosus 92/33/0076  . Vulvar irritation  04/09/2019  . Osteopenia 06/14/2018  . Hypothyroidism 04/26/2018  . 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  . Complex tear of lateral meniscus of right knee as current injury 08/03/2017  . Primary osteoarthritis of right knee 08/03/2017  . Advance directive discussed with patient 03/13/2017  . Cataracts, bilateral 08/14/2016  . Rotator cuff tendinitis, right 08/14/2016  . Right kidney mass 08/07/2016  . Hyperlipidemia 08/07/2016  . Urge incontinence of urine 08/07/2016  . 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 Bladder cancer ([place])   . Controlled type 2 diabetes mellitus ([place])   . Depression   . Benign hypertensive renal disease   . PVD (peripheral vascular disease) ([place])    Lady Deutscher PT, DPT [phone] Lady Deutscher 05/19/2021, 10:22 AM  [place] MAIN [facility] SERVICES [address] [place], [place], [address] Phone: [phone]   Fax:  [phone]  Name: Stacy Mercado MRN: [id number] Date of Birth: Feb 12, 1946

## 2021-05-18 NOTE — Chronic Care Management (AMB) (Signed)
Chronic Care Management Pharmacy Assistant   Name: OLANNA PERCIFIELD  MRN: [id number] DOB: 1946-07-27   Reason for Encounter: Disease State General Adherence   Recent office visits:  None noted  Recent consult visits:  05/18/2021 Lady Deutscher (Physical Therapy) Outpatient rehab for dizziness and giddiness.  05/10/2021 Debroah Loop, PA (Urology) Seen for Over active bladder. [name] samples given. Bladder scan completed. Follow up in 5 months. 05/10/2021  Lady Deutscher (Physical Therapy) Outpatient rehab for dizziness and giddiness. 05/03/2021  Lady Deutscher (Physical Therapy) Outpatient rehab for dizziness and giddiness. 04/26/2021 Hollice Espy, MD (Urology) Seen for Over active bladder. Botox injection given. [name] 50 mg samples given. Follow up in 2 weeks.  Hospital visits:  None in previous 6 months  Medications: Outpatient Encounter Medications as of 05/18/2021  Medication Sig Note  . aspirin 81 MG chewable tablet Chew 81 mg by mouth daily.    . baclofen (LIORESAL) 10 MG tablet Take 1 tablet (10 mg total) by mouth at bedtime. (Patient taking differently: Take 10 mg by mouth at bedtime as needed for muscle spasms.)   . escitalopram (LEXAPRO) 10 MG tablet Take 1 tablet (10 mg total) by mouth daily.   Marland Kitchen gabapentin (NEURONTIN) 300 MG capsule TAKE 1 CAPSULE(300 MG) BY MOUTH AT BEDTIME   . HYDROcodone-acetaminophen (NORCO/VICODIN) 5-325 MG tablet Take 1-2 tablets by mouth every 6 (six) hours as needed for moderate pain. 04/25/2021: Last dose was in  December  . levothyroxine (SYNTHROID) 50 MCG tablet TAKE 1 TABLET BY MOUTH DAILY BEFORE BREAKFAST   . Lidocaine-Menthol (ICY HOT LIDOCAINE PLUS MENTHOL EX) Apply 1 application topically 2 (two) times daily as needed (muscle pain).   Marland Kitchen losartan (COZAAR) 25 MG tablet Take 1 tablet (25 mg total) by mouth daily.   . metFORMIN (GLUCOPHAGE-XR) 500 MG 24 hr tablet TAKE 2 TABLETS(1000 MG) BY MOUTH IN THE MORNING AND AT BEDTIME    . mirabegron ER ([name]) 50 MG TB24 tablet Take 1 tablet (50 mg total) by mouth daily.   . montelukast (SINGULAIR) 10 MG tablet Take 1 tablet (10 mg total) by mouth at bedtime.   . Multiple Vitamin (MULTIVITAMIN WITH MINERALS) TABS tablet Take 1 tablet by mouth daily.   . naproxen (NAPROSYN) 500 MG tablet Take 1 tablet (500 mg total) by mouth 2 (two) times daily with a meal.    No facility-administered encounter medications on file as of 05/18/2021.   Recent Relevant Labs: Lab Results  Component Value Date/Time   HGBA1C 6.3 01/11/2021 09:59 AM   HGBA1C 7.3 (H) 10/25/2020 01:23 PM   HGBA1C 6.4 07/06/2020 11:23 AM   HGBA1C 5.3 05/24/2016 12:00 AM   HGBA1C 5.9 12/29/2015 12:00 AM   MICROALBUR 10 01/02/2020 09:23 AM   MICROALBUR 10 03/31/2019 01:51 PM    Kidney Function Lab Results  Component Value Date/Time   CREATININE 0.70 03/11/2021 11:44 AM   CREATININE 0.79 01/11/2021 10:00 AM   GFRNONAA 73 01/11/2021 10:00 AM   GFRNONAA >60 10/27/2020 07:50 AM   GFRAA 84 01/11/2021 10:00 AM   Have you had any problems recently with your health? Patient states she does not have any problems with her health.  Have you had any problems with your pharmacy? Patient states she does not have any problems with her pharmacy  What issues or side effects are you having with your medications? Patient states she does not have any issues or side effects with her medications.  What would you like me to pass along to  Almyra Free Harris,CPP for them to help you with?  Patient states there is nothing at this time.  What can we do to take care of you better? Patient states there is nothing at this time.  Star Rating Drugs: Losartan 25 mg Last filled:04/04/2021 90 DS Metformin 500 mg Last filled:04/04/2021 90 DS  [name], [place] [phone]

## 2021-05-24 ENCOUNTER — Ambulatory Visit: Payer: Medicare Other | Admitting: Physical Therapy

## 2021-05-30 ENCOUNTER — Other Ambulatory Visit: Payer: Self-pay

## 2021-05-30 ENCOUNTER — Ambulatory Visit: Payer: Medicare Other | Admitting: Physical Therapy

## 2021-05-30 ENCOUNTER — Encounter: Payer: Self-pay | Admitting: Physical Therapy

## 2021-05-30 ENCOUNTER — Encounter: Payer: Self-pay | Admitting: Family Medicine

## 2021-05-30 DIAGNOSIS — R42 Dizziness and giddiness: Secondary | ICD-10-CM

## 2021-05-30 NOTE — Therapy (Signed)
[place] MAIN [facility] SERVICES [address] [place], [place], [address] Phone: [phone]   Fax:  [phone]  Physical Therapy Treatment/Recertification Dates of Service: 03/07/2021-05/30/2021 Total Number of Visits: 7  Patient Details  Name: Stacy Mercado MRN: [id number] Date of Birth: 1946/12/11 Referring Provider (PT): Dr. Pryor Ochoa   Encounter Date: 05/30/2021   PT End of Session - 05/31/21 1636     Visit Number 7    Number of Visits 19    Date for PT Re-Evaluation 07/25/21    PT Start Time 1243    PT Stop Time 1340    PT Time Calculation (min) 57 min    Equipment Utilized During Treatment Gait belt    Activity Tolerance Patient tolerated treatment well    Behavior During Therapy [facility] for tasks assessed/performed             Past Medical History:  Diagnosis Date   Arthritis    Bladder cancer ([place]) 2017   Chronic low back pain    Depression    Diabetes mellitus without complication ([place])    History of blood transfusion    History of iron deficiency anemia    History of pleurisy    History of pneumonia    Hyperlipidemia 08/07/2016   Hypertension    Hypothyroidism    Migraines    Personal history of chemotherapy    Bladder Ca   Urge incontinence of urine 08/07/2016    Past Surgical History:  Procedure Laterality Date   ABDOMINAL HYSTERECTOMY     APPENDECTOMY  1961   bladder cancer removed  2014   BOTOX INJECTION N/A 11/08/2020   Procedure: BOTOX INJECTION;  Surgeon: Hollice Espy, MD;  Location: [facility] ORS;  Service: Urology;  Laterality: N/A;   CATARACT EXTRACTION W/ INTRAOCULAR LENS  IMPLANT, BILATERAL     complete hysterectomy  1999   DILATION AND CURETTAGE OF UTERUS  1999   IR RADIOLOGIST EVAL & MGMT  07/20/2020   IR RADIOLOGIST EVAL & MGMT  10/19/2020   IR RADIOLOGIST EVAL & MGMT  12/02/2020   IR RADIOLOGIST EVAL & MGMT  03/15/2021   L rotator cuff Left 2010   R thumb ruptured ligament and tendon  2012    RADIOLOGY WITH ANESTHESIA Right 10/27/2020   Procedure: IR WITH ANESTHESIA RIGHT RENAL CRYOABLATION;  Surgeon: Sandi Mariscal, MD;  Location: [facility] ORS;  Service: Anesthesiology;  Laterality: Right;   REVERSE SHOULDER ARTHROPLASTY Right 11/20/2017   Procedure: REVERSE SHOULDER ARTHROPLASTY;  Surgeon: Corky Mull, MD;  Location: [facility] ORS;  Service: Orthopedics;  Laterality: Right;   SHOULDER ARTHROSCOPY WITH BICEPSTENOTOMY Right 01/30/2017   Procedure: SHOULDER ARTHROSCOPY WITH BICEPSTENOTOMY;  Surgeon: Corky Mull, MD;  Location: [facility] ORS;  Service: Orthopedics;  Laterality: Right;  biceps tenolysis   SHOULDER ARTHROSCOPY WITH OPEN ROTATOR CUFF REPAIR Right 01/30/2017   Procedure: SHOULDER ARTHROSCOPY WITH OPEN ROTATOR CUFF REPAIR;  Surgeon: Corky Mull, MD;  Location: [facility] ORS;  Service: Orthopedics;  Laterality: Right;  Massive rotator cuff tear repair    There were no vitals filed for this visit.   Subjective Assessment - 05/30/21 1242     Subjective Patient reports that turning around (the body wall roll exercise) makes her head feel really full.    Pertinent History History obtained from patient report and medical history. Patient was seen at Urgent Care for sever right ear pain on 12/02/2020, and she began to experience dizziness/imbalance shortly afterwards. Patient was seen by Dr.  Vaught, ENT physician, on 02/09/21 and VNG testing was ordered. 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  Patient describes her dizziness as lightheadedness and sensation of head fullness, and patient states she frequently veers when walking. Patient denies vertigo. Patient reports when she is walking and at night when she sits still more are when she feels the lightheadedness sensation. Patient reports that walking and working outside doing yard work aggravate her symptoms and nothing makes it better. Patient reports that she gets daily episodes of lightheadedness and imbalance that  lasts seconds. Patient reports that one physician stated that she might have had shingles in the right ear.    Diagnostic tests VNG: 96% unilateral vestibular loss; MRI brain Feb 2022 revealed evidence of past stroke    Patient Stated Goals Patient would like to be able to garden and do yard work without dizziness/imbalance and patient would like to be able to ambulate without veering.              FUNCTIONAL OUTCOME MEASURES:  Results Comments  ABC Scale 50.6% Falls risk; in need of intervention  DGI 19/24 Falls risk; in need of intervention  FOTO 55/100  In need of intervention  10 meter Walking Speed 0.83 M/sec    Discussed functional outcome measure test results and compared to prior testing.  Discussed plan of care and goals. Patient reports good compliance with home exercise program.   Body Wall Rolls:  Patient performed 5 reps of supported, body wall rolls with eyes open with CGA.  Patient performed 5 reps of supported, body wall rolls with eyes closed with CGA.   Ambulation with head turns:  Patient performed 175' trials of forwards and retro ambulation while scanning for targets in hallway with CGA.  Patient with mild evidence of imbalance with ambulation while scanning for targets.     PT Education - 05/30/21 1242     Education Details repeated functional outcome testing and discussed results and compared to prior testing;    Person(s) Educated Patient    Methods Explanation    Comprehension Verbalized understanding              PT Short Term Goals - 05/03/21 1224       PT SHORT TERM GOAL #1   Title Pt will be independent with HEP in order to improve balance and decrease dizziness-lightheadedness and imbalance symptoms in order to decrease fall risk and improve function at home and work.    Time 4    Period Weeks    Status Achieved    Target Date 04/05/21               PT Long Term Goals - 05/30/21 1251       PT LONG TERM GOAL #1   Title  Patient will demonstrate reduced falls risk as evidenced by Dynamic Gait Index (DGI) >19/24.    Baseline scored 13/24 on 03/07/21; scored   19/24 on 05/30/2021    Time 12    Period Weeks    Status Partially Met      PT LONG TERM GOAL #2   Title Patient will reduce falls risk as indicated by Activities Specific Balance Confidence Scale (ABC) >67%.    Baseline scored 21.8% on 03/07/21;  scored  50.6% on 05/30/2021    Time 12    Period Weeks    Status Partially Met      PT LONG TERM GOAL #3   Title Patient will have  improved FOTO score of 5 points or greater in order to demonstrate improvements in patient's ADLs and functional performance.    Baseline scored 65/100 on 03/07/21; scored  55/100 on 05/30/2021    Time 12    Period Weeks    Status On-going      PT LONG TERM GOAL #4   Title Patient will report 50% or greater improvement in her symptoms of dizziness/lightheadness and imbalance with provoking motions or position    Baseline pt reports 60% improvement since eval    Time 12    Period Weeks    Status Achieved      PT LONG TERM GOAL #5   Title Patient will be able to ambulate 400' or greater on even and uneven surfaces without veering to be able to do yard work.    Baseline patient reports that she veers/bumps into the walls when walking about her home and that she uses a rake for support when moving about the yard due to imbalance on 03/07/21;    Time 12    Period Weeks    Status On-going                 Plan - 06/03/21 0809     Clinical Impression Statement Repeated functional outcome testing this date.  Patient has met 1 out of 1 short-term goals.  Patient has met 1 out of 5, partially met 2 out of 5 and 1 goal is ongoing of long-term goals set on plan of care.  Patient improved from 13/24 to 19/24 on the dynamic gait index indicating improved balance and improved from 21.8% to 50.6% on the ABC scale.  Patient reports 60% improvement in her symptoms of  dizziness/lightheadedness and imbalance with provoking motions and positions as compared to at initial eval.  Patient's FOTO score decreased to 55/100 indicating continued difficulty with functional performance and ADLs.  Patient would like to continue vestibular PT services to try to further improve her balance.  We will plan on continuing PT services again on progressions of balance and vestibular exercises to address goals and functional deficits.    Personal Factors and Comorbidities Comorbidity 3+    Comorbidities depression, migraines, DM, HTN    Examination-Activity Limitations Bend;Locomotion Level;Stairs    Examination-Participation Restrictions Yard Work    [employer] Evolving/Moderate complexity    Rehab Potential Good    PT Frequency 1x / week    PT Duration 12 weeks    PT Treatment/Interventions Canalith Repostioning;Gait training;Stair training;Therapeutic activities;Therapeutic exercise;Balance training;Neuromuscular re-education;Patient/family education;Vestibular    PT Next Visit Plan review HEP and work on progressions, semi-tandem with head and body turns, ambulation with head turns, body wall rolls    PT Home Exercise Plan VOR X 1 horizontal in sitting with plain background 1 minute reps and feet together on firm surface with horizontal and vertical head turns    Consulted and Agree with Plan of Care Patient                 Patient will benefit from skilled therapeutic intervention in order to improve the following deficits and impairments:     Visit Diagnosis: Dizziness and giddiness     Problem List Patient Active Problem List   Diagnosis Date Noted   Dizziness 12/28/2020   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/27/2020   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/22/2020   Chronic bilateral low back pain with left-sided sciatica 21/22/4825   Lichen sclerosus 00/37/0488  Vulvar irritation 04/09/2019   Osteopenia 06/14/2018    Hypothyroidism 04/26/2018   Status post reverse total shoulder replacement, 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   Complex tear of lateral meniscus of right knee as current injury 08/03/2017   Primary osteoarthritis of right knee 08/03/2017   Advance directive discussed with patient 03/13/2017   Cataracts, bilateral 08/14/2016   Rotator cuff tendinitis, right 08/14/2016   Right kidney mass 08/07/2016   Hyperlipidemia 08/07/2016   Urge incontinence of urine 08/07/2016   Spondylosis of cervical spine 08/07/2016   Bladder cancer ([place])    Controlled type 2 diabetes mellitus ([place])    Depression    Benign hypertensive renal disease    PVD (peripheral vascular disease) ([place])     Lady Deutscher PT, DPT [phone] Lady Deutscher 05/30/2021, 12:52 PM  [place] MAIN [facility] SERVICES [address] [place], [place], [address] Phone: [phone]   Fax:  [phone]  Name: Stacy Mercado MRN: [id number] Date of Birth: 1946-12-11

## 2021-06-07 ENCOUNTER — Ambulatory Visit: Payer: Medicare Other | Admitting: Physical Therapy

## 2021-06-08 ENCOUNTER — Other Ambulatory Visit: Payer: Self-pay | Admitting: Interventional Radiology

## 2021-06-08 DIAGNOSIS — N2889 Other specified disorders of kidney and ureter: Secondary | ICD-10-CM

## 2021-06-23 ENCOUNTER — Encounter: Payer: Self-pay | Admitting: Physical Therapy

## 2021-06-23 ENCOUNTER — Other Ambulatory Visit: Payer: Self-pay

## 2021-06-23 ENCOUNTER — Ambulatory Visit: Payer: Medicare Other | Attending: Otolaryngology | Admitting: Physical Therapy

## 2021-06-23 DIAGNOSIS — R42 Dizziness and giddiness: Secondary | ICD-10-CM | POA: Diagnosis not present

## 2021-06-23 NOTE — Therapy (Signed)
[place] MAIN [facility] SERVICES [address] [place], [place], [address] Phone: [phone]   Fax:  [phone]  Physical Therapy Treatment  Patient Details  Name: Stacy Mercado MRN: [id number] Date of Birth: [birth year] Referring Provider (PT): Dr. Pryor Ochoa   Encounter Date: 06/23/2021   PT End of Session - 06/23/21 1302     Visit Number 8    Number of Visits 19    Date for PT Re-Evaluation 07/25/21    PT Start Time 7544    PT Stop Time 1344    PT Time Calculation (min) 39 min    Equipment Utilized During Treatment Gait belt    Activity Tolerance Patient tolerated treatment well    Behavior During Therapy [facility] for tasks assessed/performed             Past Medical History:  Diagnosis Date   Arthritis    Bladder cancer ([place]) 2017   Chronic low back pain    Depression    Diabetes mellitus without complication ([place])    History of blood transfusion    History of iron deficiency anemia    History of pleurisy    History of pneumonia    Hyperlipidemia 08/07/2016   Hypertension    Hypothyroidism    Migraines    Personal history of chemotherapy    Bladder Ca   Urge incontinence of urine 08/07/2016    Past Surgical History:  Procedure Laterality Date   ABDOMINAL HYSTERECTOMY     APPENDECTOMY  1961   bladder cancer removed  2014   BOTOX INJECTION N/A 11/08/2020   Procedure: BOTOX INJECTION;  Surgeon: Hollice Espy, MD;  Location: [facility] ORS;  Service: Urology;  Laterality: N/A;   CATARACT EXTRACTION W/ INTRAOCULAR LENS  IMPLANT, BILATERAL     complete hysterectomy  1999   DILATION AND CURETTAGE OF UTERUS  1999   IR RADIOLOGIST EVAL & MGMT  07/20/2020   IR RADIOLOGIST EVAL & MGMT  10/19/2020   IR RADIOLOGIST EVAL & MGMT  12/02/2020   IR RADIOLOGIST EVAL & MGMT  03/15/2021   L rotator cuff Left 2010   R thumb ruptured ligament and tendon  2012   RADIOLOGY WITH ANESTHESIA Right 10/27/2020   Procedure: IR WITH ANESTHESIA RIGHT RENAL  CRYOABLATION;  Surgeon: Sandi Mariscal, MD;  Location: [facility] ORS;  Service: Anesthesiology;  Laterality: Right;   REVERSE SHOULDER ARTHROPLASTY Right 11/20/2017   Procedure: REVERSE SHOULDER ARTHROPLASTY;  Surgeon: Corky Mull, MD;  Location: [facility] ORS;  Service: Orthopedics;  Laterality: Right;   SHOULDER ARTHROSCOPY WITH BICEPSTENOTOMY Right 01/30/2017   Procedure: SHOULDER ARTHROSCOPY WITH BICEPSTENOTOMY;  Surgeon: Corky Mull, MD;  Location: [facility] ORS;  Service: Orthopedics;  Laterality: Right;  biceps tenolysis   SHOULDER ARTHROSCOPY WITH OPEN ROTATOR CUFF REPAIR Right 01/30/2017   Procedure: SHOULDER ARTHROSCOPY WITH OPEN ROTATOR CUFF REPAIR;  Surgeon: Corky Mull, MD;  Location: [facility] ORS;  Service: Orthopedics;  Laterality: Right;  Massive rotator cuff tear repair    There were no vitals filed for this visit.   Subjective Assessment - 06/23/21 1307     Subjective Patient states she has been doing okay. Patient denies falls. Patient reports she has had more dizziness lately and feels her head is "all stuffy again". Patient reports she is going to plan a follow-up appointment with Dr. Pryor Ochoa  because she said her right ear in the area of the tragus and below the intertragal notch has been hurting. Patient denies drainage and  no redness is observed.    Pertinent History History obtained from patient report and medical history. Patient was seen at Urgent Care for sever right ear pain on 12/02/2020, and she began to experience dizziness/imbalance shortly afterwards. Patient was seen by Dr. Pryor Ochoa, ENT physician, on 02/09/21 and VNG testing was ordered. 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  Patient describes her dizziness as lightheadedness and sensation of head fullness, and patient states she frequently veers when walking. Patient denies vertigo. Patient reports when she is walking and at night when she sits still more are when she feels the lightheadedness sensation.  Patient reports that walking and working outside doing yard work aggravate her symptoms and nothing makes it better. Patient reports that she gets daily episodes of lightheadedness and imbalance that lasts seconds. Patient reports that one physician stated that she might have had shingles in the right ear.    Diagnostic tests VNG: 96% unilateral vestibular loss; MRI brain Feb 2022 revealed evidence of past stroke    Patient Stated Goals Patient would like to be able to garden and do yard work without dizziness/imbalance and patient would like to be able to ambulate without veering.            Neuromuscular Re-education:  VOR X 1 exercise:  Patient performed VOR X 1 horizontal in standing on Airex pad with conflicting mirror background 3 reps of 1 minute each.  Upon arrival patient reports her dizziness is 5-6/10 and states that the VOR X 1 exercise increased her dizziness level.  Patient reports that she has not been doing the VOR X 1 exercise at home recently. Reeducated patient as to importance of VOR X 1 exercise for symptom management and patient in agreement.   Airex balance beam: On Airex balance beam, performed sideways stance static holds with eyes closed multiple 20-30 second reps.  On Airex balance beam, performed sideways stepping with horizontal head turns 5' times multiple reps. On Airex balance beam, performed sideways stepping with vertical head turns 5' times  multiple reps. Patient required CGA to Min A and pt reached to touch // bar a few times secondary to loss of balance. Patient reports increased dizziness with eyes closed activities and challenge to her balance for Airex balance exercises.   Ball toss over shoulder: Patient performed multiple 125' trials of forward  ambulation while tossing ball over one shoulder with return catch over opposite shoulder varying the ball position to head, shoulder and waist level to promote head turning and tilting with CGA. (Adapted  higher positions to have patient look up to locate the ball and then drop it to pt's shoulder level for pt to pass the ball as patient as prior shoulder injury which limits her overhead mobility)  Grapevine and Braided Walking: Patient performed right over left and then left over right grapevine activity multiple reps of length of // bars. Then, performed braided walking multiple reps of the length of // bars. Patient required CGA and pt reached to touch the // bar a few times to regain her balance. Patient had the most difficulty with bring left leg behind the right leg.       PT Education - 06/23/21 1518     Education Details exercise technique during session    Person(s) Educated Patient    Methods Explanation    Comprehension Verbalized understanding              PT Short Term Goals - 05/03/21 1224  PT SHORT TERM GOAL #1   Title Pt will be independent with HEP in order to improve balance and decrease dizziness-lightheadedness and imbalance symptoms in order to decrease fall risk and improve function at home and work.    Time 4    Period Weeks    Status Achieved    Target Date 04/05/21               PT Long Term Goals - 05/30/21 1251       PT LONG TERM GOAL #1   Title Patient will demonstrate reduced falls risk as evidenced by Dynamic Gait Index (DGI) >19/24.    Baseline scored 13/24 on 03/07/21; scored   19/24 on 05/30/2021    Time 12    Period Weeks    Status Partially Met      PT LONG TERM GOAL #2   Title Patient will reduce falls risk as indicated by Activities Specific Balance Confidence Scale (ABC) >67%.    Baseline scored 21.8% on 03/07/21;  scored  50.6% on 05/30/2021    Time 12    Period Weeks    Status Partially Met      PT LONG TERM GOAL #3   Title Patient will have improved FOTO score of 5 points or greater in order to demonstrate improvements in patient's ADLs and functional performance.    Baseline scored 65/100 on 03/07/21; scored  55/100 on  05/30/2021    Time 12    Period Weeks    Status On-going      PT LONG TERM GOAL #4   Title Patient will report 50% or greater improvement in her symptoms of dizziness/lightheadness and imbalance with provoking motions or position    Baseline pt reports 60% improvement since eval    Time 12    Period Weeks    Status Achieved      PT LONG TERM GOAL #5   Title Patient will be able to ambulate 400' or greater on even and uneven surfaces without veering to be able to do yard work.    Baseline patient reports that she veers/bumps into the walls when walking about her home and that she uses a rake for support when moving about the yard due to imbalance on 03/07/21;    Time 12    Period Weeks    Status On-going                   Plan - 06/23/21 1529     Clinical Impression Statement Patient reports that she her dizziness symptoms had been improving, but states her dizziness has increased this past week. Patient reports that she has been doing part of her home exercise program and reeducated about doing VOR X 1 exercise daily. Patient's balance was challenged by Airex balance beam, grapevine and braided walking activities this date. Patient would benefit from continued practice with eyes closed and head turning activities. Patient would benefit from continued PT services to further address goals and to try to reduce patient's falls risk.    Personal Factors and Comorbidities Comorbidity 3+    Comorbidities depression, migraines, DM, HTN    Examination-Activity Limitations Bend;Locomotion Level;Stairs    Examination-Participation Restrictions Yard Work    [employer] Evolving/Moderate complexity    Rehab Potential Good    PT Frequency 1x / week    PT Duration 12 weeks    PT Treatment/Interventions Canalith Repostioning;Gait training;Stair training;Therapeutic activities;Therapeutic exercise;Balance training;Neuromuscular re-education;Patient/family  education;Vestibular    PT Next  Visit Plan Grapevine/braided walking, try 2" X 4" board with head turns and eyes closed/ eyes open trials.    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[place]) 10/27/2020   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/22/2020   Chronic bilateral low back pain with left-sided sciatica 41/28/7867   Lichen sclerosus 67/20/9470   Vulvar irritation 04/09/2019   Osteopenia 06/14/2018   Hypothyroidism 04/26/2018   Status post reverse total shoulder replacement, 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   Complex tear of lateral meniscus of right knee as current injury 08/03/2017   Primary osteoarthritis of right knee 08/03/2017   Advance directive discussed with patient 03/13/2017   Cataracts, bilateral 08/14/2016   Rotator cuff tendinitis, right 08/14/2016   Right kidney mass 08/07/2016   Hyperlipidemia 08/07/2016   Urge incontinence of urine 08/07/2016   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 [place], [place], [address] Phone: [phone]   Fax:  [phone]  Name: AALAYA YADAO MRN: [id number] Date of Birth:  1946/06/26

## 2021-06-27 ENCOUNTER — Telehealth: Payer: Self-pay

## 2021-06-27 ENCOUNTER — Ambulatory Visit: Payer: Medicare Other | Admitting: Family Medicine

## 2021-06-27 NOTE — Chronic Care Management (AMB) (Signed)
Chronic Care Management Pharmacy Assistant   Name: CHAR FELTMAN  MRN: [id number] DOB: [birth year]  Reason for Encounter: Disease State - General Adherence   Recent office visits:  No visits noted.  Recent consult visits:  06/23/2021 Lady Deutscher, PT (Rehabilitation) - Seen for dizziness and giddiness 05/30/2021 Lady Deutscher, PT (Rehabilitation) - Seen for dizziness and giddiness 05/18/2021 Lady Deutscher, PT (Rehabilitation) - Seen for dizziness and giddiness 05/10/2021 Debroah Loop, PA-C (Urology) - Seen for overactive bladder. Labs performed. Samples of [name] provided. Follow up in 5 months. 05/10/2021 Lady Deutscher, PT (Rehabilitation) - Seen for dizziness and giddiness 05/03/2021 Lady Deutscher, PT (Rehabilitation) - Seen for dizziness and giddiness 04/26/2021 - Hollice Espy, MD (Urology) - Seen for Bladder Botox injection. Samples of [name] provided. Follow up in 2 weeks. Hospital visits:  None in previous 6 months  Medications: Outpatient Encounter Medications as of 06/27/2021  Medication Sig Note   aspirin 81 MG chewable tablet Chew 81 mg by mouth daily.     baclofen (LIORESAL) 10 MG tablet Take 1 tablet (10 mg total) by mouth at bedtime. (Patient taking differently: Take 10 mg by mouth at bedtime as needed for muscle spasms.)    escitalopram (LEXAPRO) 10 MG tablet Take 1 tablet (10 mg total) by mouth daily.    gabapentin (NEURONTIN) 300 MG capsule TAKE 1 CAPSULE(300 MG) BY MOUTH AT BEDTIME    HYDROcodone-acetaminophen (NORCO/VICODIN) 5-325 MG tablet Take 1-2 tablets by mouth every 6 (six) hours as needed for moderate pain. 04/25/2021: Last dose was in  December   levothyroxine (SYNTHROID) 50 MCG tablet TAKE 1 TABLET BY MOUTH DAILY BEFORE BREAKFAST    Lidocaine-Menthol (ICY HOT LIDOCAINE PLUS MENTHOL EX) Apply 1 application topically 2 (two) times daily as needed (muscle pain).    losartan (COZAAR) 25 MG tablet Take 1 tablet (25 mg total) by mouth  daily.    metFORMIN (GLUCOPHAGE-XR) 500 MG 24 hr tablet TAKE 2 TABLETS(1000 MG) BY MOUTH IN THE MORNING AND AT BEDTIME    mirabegron ER ([name]) 50 MG TB24 tablet Take 1 tablet (50 mg total) by mouth daily.    montelukast (SINGULAIR) 10 MG tablet Take 1 tablet (10 mg total) by mouth at bedtime.    Multiple Vitamin (MULTIVITAMIN WITH MINERALS) TABS tablet Take 1 tablet by mouth daily.    naproxen (NAPROSYN) 500 MG tablet Take 1 tablet (500 mg total) by mouth 2 (two) times daily with a meal.    No facility-administered encounter medications on file as of 06/27/2021.   Have you had any problems recently with your health? Patient stated she has no problems at this time.   Have you had any problems with your pharmacy? Patient stated she has no problems with her pharmacy at this time.  What issues or side effects are you having with your medications? Patient stated she has no issues or side effects with her medications at this time.  What would you like me to pass along to Roel Cluck for them to help you with?  Patient stated she has no information to pass along at this time.  What can we do to take care of you better? Patient stated there is nothing at this time.  Patient was informed of canceled appointment with CPP Birdena Crandall on 06/29/2021 at 12:30 and will call back to reschedule appointment.   Star Rating Drugs: Metformin 500 mg last filled on 04/04/2021 90 DS Losartan 25 mg last filled on 04/04/2021 90 DS  [name], RMA  Health Concierge (

## 2021-06-28 ENCOUNTER — Other Ambulatory Visit: Payer: Self-pay | Admitting: Otolaryngology

## 2021-06-28 ENCOUNTER — Other Ambulatory Visit (HOSPITAL_COMMUNITY): Payer: Self-pay | Admitting: Otolaryngology

## 2021-06-28 DIAGNOSIS — H8191 Unspecified disorder of vestibular function, right ear: Secondary | ICD-10-CM | POA: Diagnosis not present

## 2021-06-28 DIAGNOSIS — R42 Dizziness and giddiness: Secondary | ICD-10-CM

## 2021-06-28 DIAGNOSIS — H6061 Unspecified chronic otitis externa, right ear: Secondary | ICD-10-CM | POA: Diagnosis not present

## 2021-06-29 ENCOUNTER — Telehealth: Payer: Medicare Other

## 2021-06-30 ENCOUNTER — Other Ambulatory Visit: Payer: Self-pay

## 2021-06-30 ENCOUNTER — Ambulatory Visit: Payer: Medicare Other | Admitting: Physical Therapy

## 2021-06-30 ENCOUNTER — Encounter: Payer: Self-pay | Admitting: Physical Therapy

## 2021-06-30 DIAGNOSIS — R42 Dizziness and giddiness: Secondary | ICD-10-CM | POA: Diagnosis not present

## 2021-06-30 NOTE — Therapy (Signed)
[place] MAIN [facility] SERVICES [address] [place], [place], [address] Phone: [phone]   Fax:  [phone]  Physical Therapy Treatment  Patient Details  Name: Stacy Mercado MRN: [id number] Date of Birth: Oct 21, 1946 Referring Provider (PT): Dr. Pryor Ochoa   Encounter Date: 06/30/2021   PT End of Session - 06/30/21 1257     Visit Number 9    Number of Visits 19    Date for PT Re-Evaluation 07/25/21    PT Start Time 7564    PT Stop Time 3329    PT Time Calculation (min) 47 min    Equipment Utilized During Treatment Gait belt    Activity Tolerance Patient tolerated treatment well    Behavior During Therapy [facility] for tasks assessed/performed             Past Medical History:  Diagnosis Date   Arthritis    Bladder cancer ([place]) 2017   Chronic low back pain    Depression    Diabetes mellitus without complication ([place])    History of blood transfusion    History of iron deficiency anemia    History of pleurisy    History of pneumonia    Hyperlipidemia 08/07/2016   Hypertension    Hypothyroidism    Migraines    Personal history of chemotherapy    Bladder Ca   Urge incontinence of urine 08/07/2016    Past Surgical History:  Procedure Laterality Date   ABDOMINAL HYSTERECTOMY     APPENDECTOMY  1961   bladder cancer removed  2014   BOTOX INJECTION N/A 11/08/2020   Procedure: BOTOX INJECTION;  Surgeon: Hollice Espy, MD;  Location: [facility] ORS;  Service: Urology;  Laterality: N/A;   CATARACT EXTRACTION W/ INTRAOCULAR LENS  IMPLANT, BILATERAL     complete hysterectomy  1999   DILATION AND CURETTAGE OF UTERUS  1999   IR RADIOLOGIST EVAL & MGMT  07/20/2020   IR RADIOLOGIST EVAL & MGMT  10/19/2020   IR RADIOLOGIST EVAL & MGMT  12/02/2020   IR RADIOLOGIST EVAL & MGMT  03/15/2021   L rotator cuff Left 2010   R thumb ruptured ligament and tendon  2012   RADIOLOGY WITH ANESTHESIA Right 10/27/2020   Procedure: IR WITH ANESTHESIA RIGHT RENAL  CRYOABLATION;  Surgeon: Sandi Mariscal, MD;  Location: [facility] ORS;  Service: Anesthesiology;  Laterality: Right;   REVERSE SHOULDER ARTHROPLASTY Right 11/20/2017   Procedure: REVERSE SHOULDER ARTHROPLASTY;  Surgeon: Corky Mull, MD;  Location: [facility] ORS;  Service: Orthopedics;  Laterality: Right;   SHOULDER ARTHROSCOPY WITH BICEPSTENOTOMY Right 01/30/2017   Procedure: SHOULDER ARTHROSCOPY WITH BICEPSTENOTOMY;  Surgeon: Corky Mull, MD;  Location: [facility] ORS;  Service: Orthopedics;  Laterality: Right;  biceps tenolysis   SHOULDER ARTHROSCOPY WITH OPEN ROTATOR CUFF REPAIR Right 01/30/2017   Procedure: SHOULDER ARTHROSCOPY WITH OPEN ROTATOR CUFF REPAIR;  Surgeon: Corky Mull, MD;  Location: [facility] ORS;  Service: Orthopedics;  Laterality: Right;  Massive rotator cuff tear repair    There were no vitals filed for this visit.   Subjective Assessment - 06/30/21 1257     Subjective Patient states she saw Dr. Pryor Ochoa this week, and she reports no ear infection and scheduled to have an ultrasound of her neck. Patient states the carotid US study is scheduled for the first week in August. Patient states she sees her primary care doctor next Monday and in two weeks she has CT scan of her kidney. Patient states her dizziness comes and  goes and states it is still always there but sometimes it is a little more. Patient states that she has been doing her home exercise program this past week including the VOR X 1 exercise.    Pertinent History History obtained from patient report and medical history. Patient was seen at Urgent Care for sever right ear pain on 12/02/2020, and she began to experience dizziness/imbalance shortly afterwards. Patient was seen by Dr. Pryor Ochoa, ENT physician, on 02/09/21 and VNG testing was ordered. 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  Patient describes her dizziness as lightheadedness and sensation of head fullness, and patient states she frequently veers when  walking. Patient denies vertigo. Patient reports when she is walking and at night when she sits still more are when she feels the lightheadedness sensation. Patient reports that walking and working outside doing yard work aggravate her symptoms and nothing makes it better. Patient reports that she gets daily episodes of lightheadedness and imbalance that lasts seconds. Patient reports that one physician stated that she might have had shingles in the right ear.    Diagnostic tests VNG: 96% unilateral vestibular loss; MRI brain Feb 2022 revealed evidence of past stroke    Patient Stated Goals Patient would like to be able to garden and do yard work without dizziness/imbalance and patient would like to be able to ambulate without veering.            Neuromuscular Re-education:  VOR X 1 exercise:  Patient performed VOR X 1 horizontal in standing with conflicting background 1 rep of 1 minute.  Patient performed VOR x1 horizontal while ambulating about 50 feet x 3 repetitions with contact-guard assist.  Patient denies dizziness but noted mild imbalance that patient was able to self-correct which improved with repeated repetitions. Patient denies dizziness with this activity.   Airex balance beam: On Airex balance beam, performed sideways stance static holds with eyes closed 2 reps of 30 seconds each.  Patient had difficulty with this activity with several losses of balance requiring patient to reach for support or external assist. On Airex balance beam, performed sideways stepping with horizontal head turns 5' times 4 reps. On Airex balance beam, performed sideways stepping with vertical head turns 5' times 4 reps. On Airex balance beam, performed tandem walking 5' times 1 repetition as this was very challenging for patient.  Performed tandem walking on firm surface 5 feet x 2 reps with contact-guard in parallel bars.  Patient touching occasionally on the bar for support.  Heel raises: Patient  performed with bilateral upper extremity support on parallel bar 20 reps of heel raises with 3-second holds.  Patient required cueing exercise technique. Discussed that patient could perform this exercise standing at kitchen counter for home exercise program to help improve strength and balance.  2" X 4" board:    On 2" X 4" board, worked on static sideways stance with eyes closed hold for 30 to 60 seconds multiple reps with contact-guard to min assist. On 2" X 4" board, worked on static sideways stance holds. On 2" X 4" board, worked on sidestepping left/right with and without head turns and then worked on sidestepping left/right with horizontal and then vertical head turns 8' times multiple reps of each type.  Patient required CGA to min assistance with activities on 2 X 4" board.  Patient reports no dizziness with these activities.  These activities were challenging for patient's balance but she improved with practice.     PT  Education - 06/30/21 1257     Education Details discussed heel raises exercise for home; discussed balance/exercise techniques during session    Person(s) Educated Patient    Methods Explanation;Verbal cues    Comprehension Verbalized understanding              PT Short Term Goals - 05/03/21 1224       PT SHORT TERM GOAL #1   Title Pt will be independent with HEP in order to improve balance and decrease dizziness-lightheadedness and imbalance symptoms in order to decrease fall risk and improve function at home and work.    Time 4    Period Weeks    Status Achieved    Target Date 04/05/21               PT Long Term Goals - 05/30/21 1251       PT LONG TERM GOAL #1   Title Patient will demonstrate reduced falls risk as evidenced by Dynamic Gait Index (DGI) >19/24.    Baseline scored 13/24 on 03/07/21; scored   19/24 on 05/30/2021    Time 12    Period Weeks    Status Partially Met      PT LONG TERM GOAL #2   Title Patient will reduce falls risk  as indicated by Activities Specific Balance Confidence Scale (ABC) >67%.    Baseline scored 21.8% on 03/07/21;  scored  50.6% on 05/30/2021    Time 12    Period Weeks    Status Partially Met      PT LONG TERM GOAL #3   Title Patient will have improved FOTO score of 5 points or greater in order to demonstrate improvements in patient's ADLs and functional performance.    Baseline scored 65/100 on 03/07/21; scored  55/100 on 05/30/2021    Time 12    Period Weeks    Status On-going      PT LONG TERM GOAL #4   Title Patient will report 50% or greater improvement in her symptoms of dizziness/lightheadness and imbalance with provoking motions or position    Baseline pt reports 60% improvement since eval    Time 12    Period Weeks    Status Achieved      PT LONG TERM GOAL #5   Title Patient will be able to ambulate 400' or greater on even and uneven surfaces without veering to be able to do yard work.    Baseline patient reports that she veers/bumps into the walls when walking about her home and that she uses a rake for support when moving about the yard due to imbalance on 03/07/21;    Time 12    Period Weeks    Status On-going                   Plan - 06/30/21 1423     Clinical Impression Statement Patient reports good compliance with home exercise program.  Patient able to progress to walking VOR x1 with conflicting background, and patient reports no increase in dizziness with VOR exercises this date which is an improvement as compared to last session.  Patient continues to be challenged by exercises on the Airex balance beam as well as added high-level balance exercises on 2 inch x 4 inch board this date.  Patient demonstrates decreased strength in bilateral calf muscles therefore practiced heel raises this date and added to home exercise program.  Next session will plan on trying a balance obstacle course.  Patient encouraged to continue to perform home exercise program and follow-up  as indicated.  Patient would benefit from continued PT services to further address goals and to try to decrease falls risk.    Personal Factors and Comorbidities Comorbidity 3+    Comorbidities depression, migraines, DM, HTN    Examination-Activity Limitations Bend;Locomotion Level;Stairs    Examination-Participation Restrictions Yard Work    [employer] Evolving/Moderate complexity    Rehab Potential Good    PT Frequency 1x / week    PT Duration 12 weeks    PT Treatment/Interventions Canalith Repostioning;Gait training;Stair training;Therapeutic activities;Therapeutic exercise;Balance training;Neuromuscular re-education;Patient/family education;Vestibular    PT Next Visit Plan [place]/braided walking, try 2" X 4" board with head turns and eyes closed/ eyes open trials.    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/22/2020   Chronic bilateral low back pain with left-sided sciatica 70/96/4383   Lichen sclerosus 81/84/0375   Vulvar irritation 04/09/2019   Osteopenia 06/14/2018   Hypothyroidism 04/26/2018   Status post reverse total shoulder replacement, 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   Complex tear of lateral meniscus of right knee as current injury 08/03/2017   Primary osteoarthritis of right knee 08/03/2017   Advance directive discussed with patient  03/13/2017   Cataracts, bilateral 08/14/2016   Rotator cuff tendinitis, right 08/14/2016   Right kidney mass 08/07/2016   Hyperlipidemia 08/07/2016   Urge incontinence of urine 08/07/2016   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 [place], [place], [address] Phone: [phone]   Fax:  [phone]  Name: Stacy Mercado MRN: [id number] Date of Birth: 1946/03/23

## 2021-07-03 ENCOUNTER — Other Ambulatory Visit: Payer: Self-pay | Admitting: Family Medicine

## 2021-07-03 NOTE — Telephone Encounter (Signed)
Requested Prescriptions  Pending Prescriptions Disp Refills  . losartan (COZAAR) 25 MG tablet [Pharmacy Med Name: LOSARTAN 25MG  TABLETS] 30 tablet 0    Sig: TAKE 1 TABLET(25 MG) BY MOUTH DAILY     Cardiovascular:  Angiotensin Receptor Blockers Passed - 07/03/2021  4:39 PM      Passed - Cr in normal range and within 180 days    Creatinine, Ser  Date Value Ref Range Status  03/11/2021 0.70 0.44 - 1.00 mg/dL Final         Passed - K in normal range and within 180 days    Potassium  Date Value Ref Range Status  01/11/2021 4.6 3.5 - 5.2 mmol/L Final         Passed - Patient is not pregnant      Passed - Last BP in normal range    BP Readings from Last 1 Encounters:  05/10/21 134/67         Passed - Valid encounter within last 6 months    Recent Outpatient Visits          5 months ago Dizziness   [place], Megan P, DO   5 months ago Controlled type 2 diabetes mellitus without complication, without long-term current use of insulin ([place])   [place], Garner, DO   6 months ago Dizziness   [place] Rory Percy M, DO   12 months ago Controlled type 2 diabetes mellitus without complication, without long-term current use of insulin ([place])   [place], Megan P, DO   1 year ago Benign hypertensive renal disease   [facility] Johnson, Megan P, DO      Future Appointments            Tomorrow Johnson, [place], DO [place], [place]   In 1 month  [place], [facility]   In 3 months Vaillancourt, [place], PA-C [place]   In 6 months  [employer], [facility]           . naproxen (NAPROSYN) 500 MG tablet [Pharmacy Med Name: NAPROXEN 500MG  TABLETS] 60 tablet 0    Sig: TAKE 1 TABLET(500 MG) BY MOUTH TWICE DAILY WITH A MEAL     Analgesics:  NSAIDS Passed - 07/03/2021  4:39 PM      Passed - Cr in normal range  and within 360 days    Creatinine, Ser  Date Value Ref Range Status  03/11/2021 0.70 0.44 - 1.00 mg/dL Final         Passed - HGB in normal range and within 360 days    Hemoglobin  Date Value Ref Range Status  10/27/2020 13.3 12.0 - 15.0 g/dL Final  01/02/2020 12.9 11.1 - 15.9 g/dL Final         Passed - Patient is not pregnant      Passed - Valid encounter within last 12 months    Recent Outpatient Visits          5 months ago Dizziness   [place], DO   5 months ago Controlled type 2 diabetes mellitus without complication, without long-term current use of insulin ([place])   [place], [place], DO   6 months ago Dizziness   [place] Rory Percy M, DO   12 months ago Controlled type 2 diabetes mellitus without complication, without long-term current use of insulin ([place])  [place], [place], DO   1 year ago Benign hypertensive renal disease   [facility] Rentchler, Barb Merino, DO      Future Appointments            Tomorrow Wynetta Emery, Barb Merino, DO [facility], [place]   In 1 month  [place], [facility]   In 3 months Vaillancourt, Aldona Bar, PA-C [facility]   In 6 months  [employer], [place]

## 2021-07-03 NOTE — Telephone Encounter (Signed)
Refilled 07/03/21

## 2021-07-04 ENCOUNTER — Ambulatory Visit: Payer: Medicare Other | Admitting: Family Medicine

## 2021-07-04 ENCOUNTER — Other Ambulatory Visit: Payer: Self-pay | Admitting: Physician Assistant

## 2021-07-04 ENCOUNTER — Telehealth: Payer: Self-pay

## 2021-07-04 ENCOUNTER — Other Ambulatory Visit: Payer: Self-pay

## 2021-07-04 ENCOUNTER — Ambulatory Visit (INDEPENDENT_AMBULATORY_CARE_PROVIDER_SITE_OTHER): Payer: Medicare Other | Admitting: Family Medicine

## 2021-07-04 ENCOUNTER — Encounter: Payer: Self-pay | Admitting: Family Medicine

## 2021-07-04 VITALS — BP 117/66 | HR 65 | Temp 98.0°F | Ht 64.6 in | Wt 186.6 lb

## 2021-07-04 DIAGNOSIS — I129 Hypertensive chronic kidney disease with stage 1 through stage 4 chronic kidney disease, or unspecified chronic kidney disease: Secondary | ICD-10-CM

## 2021-07-04 DIAGNOSIS — E119 Type 2 diabetes mellitus without complications: Secondary | ICD-10-CM | POA: Diagnosis not present

## 2021-07-04 DIAGNOSIS — E782 Mixed hyperlipidemia: Secondary | ICD-10-CM

## 2021-07-04 DIAGNOSIS — I739 Peripheral vascular disease, unspecified: Secondary | ICD-10-CM

## 2021-07-04 DIAGNOSIS — F3341 Major depressive disorder, recurrent, in partial remission: Secondary | ICD-10-CM | POA: Diagnosis not present

## 2021-07-04 DIAGNOSIS — E039 Hypothyroidism, unspecified: Secondary | ICD-10-CM

## 2021-07-04 DIAGNOSIS — N3281 Overactive bladder: Secondary | ICD-10-CM

## 2021-07-04 LAB — URINALYSIS, ROUTINE W REFLEX MICROSCOPIC
Bilirubin, UA: NEGATIVE
Glucose, UA: NEGATIVE
Ketones, UA: NEGATIVE
Leukocytes,UA: NEGATIVE
Nitrite, UA: NEGATIVE
Protein,UA: NEGATIVE
RBC, UA: NEGATIVE
Specific Gravity, UA: 1.02 (ref 1.005–1.030)
Urobilinogen, Ur: 0.2 mg/dL (ref 0.2–1.0)
pH, UA: 5 (ref 5.0–7.5)

## 2021-07-04 LAB — MICROALBUMIN, URINE WAIVED
Creatinine, Urine Waived: 200 mg/dL (ref 10–300)
Microalb, Ur Waived: 10 mg/L (ref 0–19)
Microalb/Creat Ratio: <30 mg/g (ref <30)

## 2021-07-04 LAB — BAYER DCA HB A1C WAIVED: HB A1C (BAYER DCA - WAIVED): 6.8 % (ref <7.0)

## 2021-07-04 MED ORDER — LOSARTAN POTASSIUM 25 MG PO TABS: ORAL_TABLET | ORAL | 1 refills | Status: DC

## 2021-07-04 MED ORDER — BACLOFEN 10 MG PO TABS: 10.0000 mg | ORAL_TABLET | Freq: Every day | ORAL | 1 refills | Status: AC

## 2021-07-04 MED ORDER — MONTELUKAST SODIUM 10 MG PO TABS: 10.0000 mg | ORAL_TABLET | Freq: Every day | ORAL | 3 refills | Status: AC

## 2021-07-04 MED ORDER — METFORMIN HCL ER 500 MG PO TB24: ORAL_TABLET | ORAL | 1 refills | Status: DC

## 2021-07-04 MED ORDER — NAPROXEN 500 MG PO TABS: ORAL_TABLET | ORAL | 6 refills | Status: AC

## 2021-07-04 MED ORDER — ESCITALOPRAM OXALATE 10 MG PO TABS
10.0000 mg | ORAL_TABLET | Freq: Every day | ORAL | 1 refills | Status: DC
Start: 2021-07-04 — End: 2021-10-04

## 2021-07-04 MED ORDER — GABAPENTIN 300 MG PO CAPS: ORAL_CAPSULE | ORAL | 1 refills | Status: DC

## 2021-07-04 NOTE — Telephone Encounter (Signed)
Pt LM on triage line requesting 2 more months of [name] 50mg  samples. Ok to provide? Please advise.

## 2021-07-04 NOTE — Telephone Encounter (Signed)
Yes, ok to provide

## 2021-07-04 NOTE — Assessment & Plan Note (Signed)
Under good control on current regimen. Continue current regimen. Continue to monitor. Call with any concerns. Refills given. Labs drawn today.   

## 2021-07-04 NOTE — Assessment & Plan Note (Signed)
Doing well with A1c of 6.8- continue current regimen. Continue to monitor. Call with any concerns.

## 2021-07-04 NOTE — Assessment & Plan Note (Signed)
Will keep BP, cholesterol and sugars under good control. Continue to monitor. Call with any concerns.  

## 2021-07-04 NOTE — Progress Notes (Signed)
BP 117/66   Pulse 65   Temp 98 F (36.7 C) (Oral)   Ht 5' 4.6" (1.641 m)   Wt 186 lb 9.6 oz (84.6 kg)   SpO2 97%   BMI 31.44 kg/m    Subjective:    Patient ID: Stacy Mercado, female    DOB: [birth year], 75 y.o.   MRN: [id number]  HPI: Stacy Mercado is a 75 y.o. female  Chief Complaint  Patient presents with   Depression   Diabetes   Hyperlipidemia   Hypertension   HYPERTENSION / HYPERLIPIDEMIA Satisfied with current treatment? yes Duration of hypertension: chronic BP monitoring frequency: not checking BP medication side effects: no Duration of hyperlipidemia: chronic Cholesterol medication side effects: no Cholesterol supplements: none Past cholesterol medications: repatha Medication compliance: excellent compliance Aspirin: yes Recent stressors: no Recurrent headaches: no Visual changes: no Palpitations: no Dyspnea: no Chest pain: no Lower extremity edema: no Dizzy/lightheaded: no  DIABETES Hypoglycemic episodes:no Polydipsia/polyuria: no Visual disturbance: no Chest pain: no Paresthesias: no Glucose Monitoring: no Taking Insulin?: no Blood Pressure Monitoring: not checking Retinal Examination: Up to Date Foot Exam: Up to Date Diabetic Education: Completed Pneumovax: Up to Date Influenza: Up to Date Aspirin: yes  HYPOTHYROIDISM Thyroid control status:controlled Satisfied with current treatment? yes Medication side effects: no Medication compliance: excellent compliance Etiology of hypothyroidism:  Recent dose adjustment:no Fatigue: yes Cold intolerance: no Heat intolerance: no Weight gain: no Weight loss: no Constipation: no Diarrhea/loose stools: no Palpitations: no Lower extremity edema: no Anxiety/depressed mood: no   DEPRESSION Mood status: controlled Satisfied with current treatment?: no Symptom severity: mild  Duration of current treatment : chronic Side effects: no Medication compliance: excellent  compliance Psychotherapy/counseling: no  Previous psychiatric medications: lexapro Depressed mood: no Anxious mood: no Anhedonia: no Significant weight loss or gain: no Insomnia: no  Fatigue: yes Feelings of worthlessness or guilt: no Impaired concentration/indecisiveness: no Suicidal ideations: no Hopelessness: no Crying spells: no Depression screen [facility] 2/9 07/04/2021 01/14/2021 12/28/2020 04/01/2020 01/02/2020  Decreased Interest 0 0 0 0 0  Down, Depressed, Hopeless 0 0 0 0 1  PHQ - 2 Score 0 0 0 0 1  Altered sleeping 3 - 3 1 3   Tired, decreased energy 1 - 3 1 3   Change in appetite 0 - 3 0 0  Feeling bad or failure about yourself  0 - 0 0 0  Trouble concentrating 0 - 0 0 0  Moving slowly or fidgety/restless 0 - 0 0 0  Suicidal thoughts 0 - 0 - 0  PHQ-9 Score 4 - 9 2 7   Difficult doing work/chores Not difficult at all - - Not difficult at all Not difficult at all  Some recent data might be hidden    Relevant past medical, surgical, family and social history reviewed and updated as indicated. Interim medical history since our last visit reviewed. Allergies and medications reviewed and updated.  Review of Systems  Constitutional: Negative.   Respiratory: Negative.    Cardiovascular: Negative.   Musculoskeletal: Negative.   Neurological:  Positive for dizziness and light-headedness. Negative for tremors, seizures, syncope, facial asymmetry, speech difficulty, weakness, numbness and headaches.  Psychiatric/Behavioral: Negative.     Per HPI unless specifically indicated above     Objective:    BP 117/66   Pulse 65   Temp 98 F (36.7 C) (Oral)   Ht 5' 4.6" (1.641 m)   Wt 186 lb 9.6 oz (84.6 kg)   SpO2 97%   BMI 31.44 kg/m  Wt Readings from Last 3 Encounters:  07/04/21 186 lb 9.6 oz (84.6 kg)  05/10/21 183 lb (83 kg)  04/26/21 183 lb (83 kg)    Physical Exam Vitals and nursing note reviewed.  Constitutional:      General: She is not in acute distress.     Appearance: Normal appearance. She is not ill-appearing, toxic-appearing or diaphoretic.  HENT:     Head: Normocephalic and atraumatic.     Right Ear: External ear normal.     Left Ear: External ear normal.     Nose: Nose normal.     Mouth/Throat:     Mouth: Mucous membranes are moist.     Pharynx: Oropharynx is clear.  Eyes:     General: No scleral icterus.       Right eye: No discharge.        Left eye: No discharge.     Extraocular Movements: Extraocular movements intact.     Conjunctiva/sclera: Conjunctivae normal.     Pupils: Pupils are equal, round, and reactive to light.  Cardiovascular:     Rate and Rhythm: Normal rate and regular rhythm.     Pulses: Normal pulses.     Heart sounds: Normal heart sounds. No murmur heard.   No friction rub. No gallop.  Pulmonary:     Effort: Pulmonary effort is normal. No respiratory distress.     Breath sounds: Normal breath sounds. No stridor. No wheezing, rhonchi or rales.  Chest:     Chest wall: No tenderness.  Musculoskeletal:        General: Normal range of motion.     Cervical back: Normal range of motion and neck supple.  Skin:    General: Skin is warm and dry.     Capillary Refill: Capillary refill takes less than 2 seconds.     Coloration: Skin is not jaundiced or pale.     Findings: No bruising, erythema, lesion or rash.  Neurological:     General: No focal deficit present.     Mental Status: She is alert and oriented to person, place, and time. Mental status is at baseline.  Psychiatric:        Mood and Affect: Mood normal.        Behavior: Behavior normal.        Thought Content: Thought content normal.        Judgment: Judgment normal.    Results for orders placed or performed in visit on 07/04/21  Bayer DCA Hb A1c Waived  Result Value Ref Range   HB A1C (BAYER DCA - WAIVED) 6.8 <7.0 %  Microalbumin, Urine Waived  Result Value Ref Range   Microalb, Ur Waived 10 0 - 19 mg/L   Creatinine, Urine Waived 200 10 - 300  mg/dL   Microalb/Creat Ratio <30 <30 mg/g  Urinalysis, Routine w reflex microscopic  Result Value Ref Range   Specific Gravity, UA 1.020 1.005 - 1.030   pH, UA 5.0 5.0 - 7.5   Color, UA Yellow Yellow   Appearance Ur Clear Clear   Leukocytes,UA Negative Negative   Protein,UA Negative Negative/Trace   Glucose, UA Negative Negative   Ketones, UA Negative Negative   RBC, UA Negative Negative   Bilirubin, UA Negative Negative   Urobilinogen, Ur 0.2 0.2 - 1.0 mg/dL   Nitrite, UA Negative Negative      Assessment & Plan:   Problem List Items Addressed This Visit       Cardiovascular and Mediastinum  PVD (peripheral vascular disease) (HCC)    Will keep BP, cholesterol and sugars under good control. Continue to monitor. Call with any concerns.         Relevant Medications   losartan (COZAAR) 25 MG tablet     Endocrine   Controlled type 2 diabetes mellitus ([place]) - Primary    Doing well with A1c of 6.8- continue current regimen. Continue to monitor. Call with any concerns.        Relevant Medications   metFORMIN (GLUCOPHAGE-XR) 500 MG 24 hr tablet   losartan (COZAAR) 25 MG tablet   Other Relevant Orders   Bayer DCA Hb A1c Waived (Completed)   CBC with Differential/Platelet   Comprehensive metabolic panel   Microalbumin, Urine Waived (Completed)   Urinalysis, Routine w reflex microscopic (Completed)   Hypothyroidism    Rechecking labs today. Await results. Treat as needed.        Relevant Orders   CBC with Differential/Platelet   Comprehensive metabolic panel   TSH     Genitourinary   Benign hypertensive renal disease    Under good control on current regimen. Continue current regimen. Continue to monitor. Call with any concerns. Refills given. Labs drawn today.        Relevant Orders   CBC with Differential/Platelet   Comprehensive metabolic panel   Microalbumin, Urine Waived (Completed)     Other   Depression    Under good control on current regimen.  Continue current regimen. Continue to monitor. Call with any concerns. Refills given. Labs drawn today.         Relevant Medications   escitalopram (LEXAPRO) 10 MG tablet   Other Relevant Orders   CBC with Differential/Platelet   Comprehensive metabolic panel   Hyperlipidemia    Under good control on current regimen. Continue current regimen. Continue to monitor. Call with any concerns. Refills given. Labs drawn today.         Relevant Medications   losartan (COZAAR) 25 MG tablet   Other Relevant Orders   CBC with Differential/Platelet   Comprehensive metabolic panel   Lipid Panel w/o Chol/HDL Ratio     Follow up plan: Return in about 6 months (around 01/04/2022).

## 2021-07-04 NOTE — Assessment & Plan Note (Signed)
Rechecking labs today. Await results. Treat as needed.  °

## 2021-07-05 LAB — CBC WITH DIFFERENTIAL/PLATELET
Basophils Absolute: 0.1 10*3/uL (ref 0.0–0.2)
Basos: 1 %
EOS (ABSOLUTE): 0.3 10*3/uL (ref 0.0–0.4)
Eos: 4 %
Hematocrit: 38.8 % (ref 34.0–46.6)
Hemoglobin: 12.6 g/dL (ref 11.1–15.9)
Immature Grans (Abs): 0 10*3/uL (ref 0.0–0.1)
Immature Granulocytes: 0 %
Lymphocytes Absolute: 1.5 10*3/uL (ref 0.7–3.1)
Lymphs: 23 %
MCH: 29.9 pg (ref 26.6–33.0)
MCHC: 32.5 g/dL (ref 31.5–35.7)
MCV: 92 fL (ref 79–97)
Monocytes Absolute: 1 10*3/uL — ABNORMAL HIGH (ref 0.1–0.9)
Monocytes: 15 %
Neutrophils Absolute: 3.8 10*3/uL (ref 1.4–7.0)
Neutrophils: 57 %
Platelets: 173 10*3/uL (ref 150–450)
RBC: 4.21 x10E6/uL (ref 3.77–5.28)
RDW: 12.6 % (ref 11.7–15.4)
WBC: 6.6 10*3/uL (ref 3.4–10.8)

## 2021-07-05 LAB — LIPID PANEL W/O CHOL/HDL RATIO
Cholesterol, Total: 152 mg/dL (ref 100–199)
HDL: 47 mg/dL (ref >39)
LDL Chol Calc (NIH): 75 mg/dL (ref 0–99)
Triglycerides: 178 mg/dL — ABNORMAL HIGH (ref 0–149)
VLDL Cholesterol Cal: 30 mg/dL (ref 5–40)

## 2021-07-05 LAB — COMPREHENSIVE METABOLIC PANEL
ALT: 23 IU/L (ref 0–32)
AST: 19 IU/L (ref 0–40)
Albumin/Globulin Ratio: 1.9 (ref 1.2–2.2)
Albumin: 4.1 g/dL (ref 3.7–4.7)
Alkaline Phosphatase: 64 IU/L (ref 44–121)
BUN/Creatinine Ratio: 22 (ref 12–28)
BUN: 18 mg/dL (ref 8–27)
Bilirubin Total: 0.5 mg/dL (ref 0.0–1.2)
CO2: 23 mmol/L (ref 20–29)
Calcium: 10.4 mg/dL — ABNORMAL HIGH (ref 8.7–10.3)
Chloride: 102 mmol/L (ref 96–106)
Creatinine, Ser: 0.81 mg/dL (ref 0.57–1.00)
Globulin, Total: 2.2 g/dL (ref 1.5–4.5)
Glucose: 165 mg/dL — ABNORMAL HIGH (ref 65–99)
Potassium: 4.3 mmol/L (ref 3.5–5.2)
Sodium: 140 mmol/L (ref 134–144)
Total Protein: 6.3 g/dL (ref 6.0–8.5)
eGFR: 75 mL/min/{1.73_m2} (ref >59)

## 2021-07-05 LAB — TSH: TSH: 4.57 u[IU]/mL — ABNORMAL HIGH (ref 0.450–4.500)

## 2021-07-05 NOTE — Telephone Encounter (Signed)
2 mos samples placed up front. LMOM (ok per DPR) notifying patient samples were ready for pickup.

## 2021-07-06 ENCOUNTER — Other Ambulatory Visit: Payer: Self-pay | Admitting: Family Medicine

## 2021-07-06 DIAGNOSIS — E039 Hypothyroidism, unspecified: Secondary | ICD-10-CM

## 2021-07-06 MED ORDER — LEVOTHYROXINE SODIUM 75 MCG PO TABS: 75.0000 ug | ORAL_TABLET | Freq: Every day | ORAL | 1 refills | Status: DC

## 2021-07-06 NOTE — Telephone Encounter (Signed)
   Notes to clinic:  Patient requests 90 days supply Requested Prescriptions  Pending Prescriptions Disp Refills   levothyroxine (SYNTHROID) 75 MCG tablet [Pharmacy Med Name: LEVOTHYROXINE 0.075MG  (75MCG) TABS] 90 tablet     Sig: TAKE 1 TABLET(75 MCG) BY MOUTH DAILY BEFORE BREAKFAST      Endocrinology:  Hypothyroid Agents Failed - 07/06/2021  1:10 PM      Failed - TSH needs to be rechecked within 3 months after an abnormal result. Refill until TSH is due.      Failed - TSH in normal range and within 360 days    TSH  Date Value Ref Range Status  07/04/2021 4.570 (H) 0.450 - 4.500 uIU/mL Final          Passed - Valid encounter within last 12 months    Recent Outpatient Visits           2 days ago Controlled type 2 diabetes mellitus without complication, without long-term current use of insulin ([place])   [place], Megan P, DO   5 months ago Dizziness   Hope P, DO   5 months ago Controlled type 2 diabetes mellitus without complication, without long-term current use of insulin ([place])   [place], [place], DO   6 months ago Dizziness   [place], DO   1 year ago Controlled type 2 diabetes mellitus without complication, without long-term current use of insulin ([place])   [place], Megan P, DO       Future Appointments             In 4 weeks  [place], [facility]   In 3 months Vaillancourt, Aldona Bar, PA-C [facility]   In 6 months Wynetta Emery, Barb Merino, DO [employer], [place]   In 6 months  [employer], [place]

## 2021-07-07 ENCOUNTER — Ambulatory Visit: Payer: Medicare Other | Admitting: Physical Therapy

## 2021-07-07 NOTE — Telephone Encounter (Signed)
Pt is scheduled 4/32 possible duplicate

## 2021-07-11 ENCOUNTER — Ambulatory Visit: Payer: Medicare Other | Admitting: Physical Therapy

## 2021-07-11 ENCOUNTER — Other Ambulatory Visit: Payer: Self-pay

## 2021-07-11 ENCOUNTER — Encounter: Payer: Self-pay | Admitting: Physical Therapy

## 2021-07-11 DIAGNOSIS — R42 Dizziness and giddiness: Secondary | ICD-10-CM | POA: Diagnosis not present

## 2021-07-12 NOTE — Therapy (Signed)
[place] MAIN [facility] SERVICES [address] [place], [place], [address] Phone: [phone]   Fax:  [phone]  Physical Therapy Treatment/Discharge Summary Dates of Service: 03/07/21- 07/11/21 Total Number of visits: 10  Patient Details  Name: Stacy Mercado MRN: [id number] Date of Birth: Nov 09, 1946 Referring Provider (PT): Dr. Pryor Ochoa   Encounter Date: 07/11/2021   PT End of Session - 07/11/21 1256     Visit Number 10    Number of Visits 19    Date for PT Re-Evaluation 07/25/21    PT Start Time 8546    PT Stop Time 2703    PT Time Calculation (min) 47 min    Equipment Utilized During Treatment Gait belt    Activity Tolerance Patient tolerated treatment well    Behavior During Therapy [facility] for tasks assessed/performed             Past Medical History:  Diagnosis Date   Arthritis    Bladder cancer ([place]) 2017   Chronic low back pain    Depression    Diabetes mellitus without complication ([place])    History of blood transfusion    History of iron deficiency anemia    History of pleurisy    History of pneumonia    Hyperlipidemia 08/07/2016   Hypertension    Hypothyroidism    Migraines    Personal history of chemotherapy    Bladder Ca   Urge incontinence of urine 08/07/2016    Past Surgical History:  Procedure Laterality Date   ABDOMINAL HYSTERECTOMY     APPENDECTOMY  1961   bladder cancer removed  2014   BOTOX INJECTION N/A 11/08/2020   Procedure: BOTOX INJECTION;  Surgeon: Hollice Espy, MD;  Location: [facility] ORS;  Service: Urology;  Laterality: N/A;   CATARACT EXTRACTION W/ INTRAOCULAR LENS  IMPLANT, BILATERAL     complete hysterectomy  1999   DILATION AND CURETTAGE OF UTERUS  1999   IR RADIOLOGIST EVAL & MGMT  07/20/2020   IR RADIOLOGIST EVAL & MGMT  10/19/2020   IR RADIOLOGIST EVAL & MGMT  12/02/2020   IR RADIOLOGIST EVAL & MGMT  03/15/2021   L rotator cuff Left 2010   R thumb ruptured ligament and tendon  2012    RADIOLOGY WITH ANESTHESIA Right 10/27/2020   Procedure: IR WITH ANESTHESIA RIGHT RENAL CRYOABLATION;  Surgeon: Sandi Mariscal, MD;  Location: [facility] ORS;  Service: Anesthesiology;  Laterality: Right;   REVERSE SHOULDER ARTHROPLASTY Right 11/20/2017   Procedure: REVERSE SHOULDER ARTHROPLASTY;  Surgeon: Corky Mull, MD;  Location: [facility] ORS;  Service: Orthopedics;  Laterality: Right;   SHOULDER ARTHROSCOPY WITH BICEPSTENOTOMY Right 01/30/2017   Procedure: SHOULDER ARTHROSCOPY WITH BICEPSTENOTOMY;  Surgeon: Corky Mull, MD;  Location: [facility] ORS;  Service: Orthopedics;  Laterality: Right;  biceps tenolysis   SHOULDER ARTHROSCOPY WITH OPEN ROTATOR CUFF REPAIR Right 01/30/2017   Procedure: SHOULDER ARTHROSCOPY WITH OPEN ROTATOR CUFF REPAIR;  Surgeon: Corky Mull, MD;  Location: [facility] ORS;  Service: Orthopedics;  Laterality: Right;  Massive rotator cuff tear repair    There were no vitals filed for this visit.   Subjective Assessment - 07/11/21 1302     Subjective Patient states she saw her primary care physician last week and they adjusted her Levothyroxine medicine. Patient states she is feeling a little better. Patient states her dizziness symptoms comes and go still, but states it is not constant like it used to be.    Pertinent History History obtained from patient  report and medical history. Patient was seen at Urgent Care for sever right ear pain on 12/02/2020, and she began to experience dizziness/imbalance shortly afterwards. Patient was seen by Dr. Pryor Ochoa, ENT physician, on 02/09/21 and VNG testing was ordered. 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  Patient describes her dizziness as lightheadedness and sensation of head fullness, and patient states she frequently veers when walking. Patient denies vertigo. Patient reports when she is walking and at night when she sits still more are when she feels the lightheadedness sensation. Patient reports that walking and working  outside doing yard work aggravate her symptoms and nothing makes it better. Patient reports that she gets daily episodes of lightheadedness and imbalance that lasts seconds. Patient reports that one physician stated that she might have had shingles in the right ear.    Diagnostic tests VNG: 96% unilateral vestibular loss; MRI brain Feb 2022 revealed evidence of past stroke    Patient Stated Goals Patient would like to be able to garden and do yard work without dizziness/imbalance and patient would like to be able to ambulate without veering.                [facility] PT Assessment - 07/11/21 1315       Dynamic Gait Index   Level Surface Normal    Change in Gait Speed Normal    Gait with Horizontal Head Turns Mild Impairment    Gait with Vertical Head Turns Normal    Gait and Pivot Turn Normal    Step Over Obstacle Mild Impairment    Step Around Obstacles Normal    Steps Mild Impairment    Total Score 21            Neuromuscular Re-education:  FUNCTIONAL OUTCOME MEASURES:  Results Comments  ABC Scale 89% Falls risk; in need of intervention  DGI 21/24 Falls risk; in need of intervention  FOTO 64/100 Like patients nationally had a score of /100; in need of intervention   Discussed functional outcome testing and compared to prior testing. Discussed home exercise program. Patient has no questions or concerns. Discussed progress towards goals and discharge plans. Patient reports that she plans on continuing to perform home exercise program upon discharge. Patient in agreement with discharge.     PT Education - 07/11/21 1256     Education Details discussed functional outcome testing and compared to prior testing; discussed progress towards goals and discharge plans    Person(s) Educated Patient    Methods Explanation    Comprehension Verbalized understanding              PT Short Term Goals - 05/03/21 1224       PT SHORT TERM GOAL #1   Title Pt will be independent with  HEP in order to improve balance and decrease dizziness-lightheadedness and imbalance symptoms in order to decrease fall risk and improve function at home and work.    Time 4    Period Weeks    Status Achieved    Target Date 04/05/21               PT Long Term Goals - 07/11/21 1307       PT LONG TERM GOAL #1   Title Patient will demonstrate reduced falls risk as evidenced by Dynamic Gait Index (DGI) >19/24.    Baseline scored 13/24 on 03/07/21; scored 19/24 on 05/30/2021;scored 21/24 on 07/11/21    Time 12    Period Weeks  Status Achieved      PT LONG TERM GOAL #2   Title Patient will reduce falls risk as indicated by Activities Specific Balance Confidence Scale (ABC) >67%.    Baseline scored 21.8% on 03/07/21;  scored  50.6% on 05/30/2021; scored  89% on 07/11/21    Time 12    Period Weeks    Status Achieved      PT LONG TERM GOAL #3   Title Patient will have improved FOTO score of 5 points or greater in order to demonstrate improvements in patient's ADLs and functional performance.    Baseline scored 65/100 on 03/07/21; scored 55/100 on 05/30/2021; Scored  64/100 on 07/11/21    Time 12    Period Weeks    Status Not Met      PT LONG TERM GOAL #4   Title Patient will report 50% or greater improvement in her symptoms of dizziness/lightheadness and imbalance with provoking motions or position    Baseline pt reports 60% improvement since eval; reports 100% improvement on 07/11/21    Time 12    Period Weeks    Status Achieved      PT LONG TERM GOAL #5   Title Patient will be able to ambulate 400' or greater on even and uneven surfaces without veering to be able to do yard work.    Baseline patient reports that she veers/bumps into the walls when walking about her home and that she uses a rake for support when moving about the yard due to imbalance on 03/07/21; reports that she has been able to do yard work when it is not so hot out and states she was able to do and would use a walking  stick for support in the yard.    Time 12    Period Weeks    Status Achieved              Plan - 07/14/21 1546     Clinical Impression Statement Performed functional outcome testing this date. Patient improved from 13/24 to 21/24 on the DGI indicating safe for community mobilty. Patient improved from 21% to 89% on teh ABC scale indicating decreased falls risk. Patient reprots 100% improvement in her symptoms of dizziness and imbalance since starting vestibular therapy. Patient has met 1/1 short term goals and 4/5 long term goals. Patient did not met FOTO goal as scored 64/100. Patient is independent with home exercise program and voices that she has no questions or concerns. Patient in agreement with discharge from PT services. Will discharge patient at this time.    Personal Factors and Comorbidities Comorbidity 3+    Comorbidities depression, migraines, DM, HTN    Examination-Activity Limitations [place];Locomotion Level;Stairs    Examination-Participation Restrictions Yard Work    [employer]chant [employer] officer Evolving/Moderate complexity    Rehab Potential Good    PT Frequency 1x / week    PT Duration 12 weeks    PT Treatment/Interventions Canal[place] Repostioning;Gait training;Stair training;Therapeutic activities;Therapeutic exercise;Balance training;Neuromuscular re-education;Patient/family education;Vestibular    PT Next Visit Plan [place]/braided walking, try 2" X 4" board with head turns and eyes closed/ eyes open trials.    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[place]) 10/27/2020   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/22/2020   Chronic bilateral low back pain with left-sided sciatica 11/73/5670   Lichen sclerosus 14/09/3012   Vulvar irritation 04/09/2019   Osteopenia 06/14/2018   Hypothyroidism 04/26/2018   Status post reverse total shoulder replacement, 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   Complex tear of lateral meniscus of right knee as current injury 08/03/2017   Primary osteoarthritis of right knee 08/03/2017   Advance directive discussed with patient 03/13/2017   Cataracts, bilateral 08/14/2016   Rotator cuff tendinitis, right 08/14/2016   Right kidney mass 08/07/2016   Hyperlipidemia 08/07/2016   Urge incontinence of urine 08/07/2016   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

## 2021-07-15 ENCOUNTER — Other Ambulatory Visit: Payer: Self-pay

## 2021-07-15 ENCOUNTER — Ambulatory Visit
Admission: RE | Admit: 2021-07-15 | Discharge: 2021-07-15 | Disposition: A | Discharge status: Home / Self Care | Payer: Medicare Other | Source: Ambulatory Visit | Attending: Interventional Radiology | Admitting: Interventional Radiology

## 2021-07-15 DIAGNOSIS — N2889 Other specified disorders of kidney and ureter: Secondary | ICD-10-CM | POA: Diagnosis not present

## 2021-07-15 DIAGNOSIS — M47816 Spondylosis without myelopathy or radiculopathy, lumbar region: Secondary | ICD-10-CM | POA: Diagnosis not present

## 2021-07-15 DIAGNOSIS — I700 Atherosclerosis of aorta: Secondary | ICD-10-CM | POA: Diagnosis not present

## 2021-07-15 DIAGNOSIS — K7689 Other specified diseases of liver: Secondary | ICD-10-CM | POA: Diagnosis not present

## 2021-07-15 LAB — POCT I-STAT CREATININE: Creatinine, Ser: 0.8 mg/dL (ref 0.44–1.00)

## 2021-07-15 MED ORDER — IOHEXOL 350 MG/ML SOLN
100.0000 mL | Freq: Once | INTRAVENOUS | Status: AC | PRN
  Administered 2021-07-15: 100 mL via INTRAVENOUS

## 2021-07-21 ENCOUNTER — Ambulatory Visit
Admission: RE | Admit: 2021-07-21 | Discharge: 2021-07-21 | Disposition: A | Discharge status: Home / Self Care | Payer: Medicare Other | Source: Ambulatory Visit | Attending: Otolaryngology | Admitting: Otolaryngology

## 2021-07-21 ENCOUNTER — Other Ambulatory Visit: Payer: Self-pay

## 2021-07-21 DIAGNOSIS — R42 Dizziness and giddiness: Secondary | ICD-10-CM | POA: Diagnosis not present

## 2021-07-21 DIAGNOSIS — I6522 Occlusion and stenosis of left carotid artery: Secondary | ICD-10-CM | POA: Diagnosis not present

## 2021-08-04 ENCOUNTER — Other Ambulatory Visit: Payer: Self-pay | Admitting: Family Medicine

## 2021-08-04 ENCOUNTER — Other Ambulatory Visit: Payer: Self-pay

## 2021-08-04 ENCOUNTER — Ambulatory Visit
Admission: RE | Admit: 2021-08-04 | Discharge: 2021-08-04 | Disposition: A | Discharge status: Home / Self Care | Payer: Medicare Other | Source: Ambulatory Visit | Attending: Interventional Radiology | Admitting: Interventional Radiology

## 2021-08-04 DIAGNOSIS — Z9889 Other specified postprocedural states: Secondary | ICD-10-CM | POA: Diagnosis not present

## 2021-08-04 DIAGNOSIS — N2889 Other specified disorders of kidney and ureter: Secondary | ICD-10-CM | POA: Diagnosis not present

## 2021-08-04 HISTORY — PX: IR RADIOLOGIST EVAL & MGMT: IMG5224

## 2021-08-04 NOTE — Telephone Encounter (Signed)
   Notes to clinic:   Patient requests 90 days supply  Requested Prescriptions  Pending Prescriptions Disp Refills   levothyroxine (SYNTHROID) 75 MCG tablet [Pharmacy Med Name: LEVOTHYROXINE 0.'075MG'$  (75MCG) TABS] 90 tablet     Sig: TAKE 1 TABLET(75 MCG) BY MOUTH DAILY BEFORE BREAKFAST     Endocrinology:  Hypothyroid Agents Failed - 08/04/2021 10:59 AM      Failed - TSH needs to be rechecked within 3 months after an abnormal result. Refill until TSH is due.      Failed - TSH in normal range and within 360 days    TSH  Date Value Ref Range Status  07/04/2021 4.570 (H) 0.450 - 4.500 uIU/mL Final          Passed - Valid encounter within last 12 months    Recent Outpatient Visits           1 month ago Controlled type 2 diabetes mellitus without complication, without long-term current use of insulin ([place])   [place], Megan P, DO   6 months ago Dizziness   [place], Megan P, DO   6 months ago Controlled type 2 diabetes mellitus without complication, without long-term current use of insulin ([place])   [place], Gail, DO   7 months ago Dizziness   [place] Myles Gip, DO   1 year ago Controlled type 2 diabetes mellitus without complication, without long-term current use of insulin ([place])   [place], [place], DO       Future Appointments             In 2 months Vaillancourt, Aldona Bar, PA-C [facility]   In 5 months Wynetta Emery, Barb Merino, DO [employer], [place]   In 5 months  [employer], [place]

## 2021-08-04 NOTE — Telephone Encounter (Signed)
[place] called and spoke to [place], [facility] about the refill(s) Levothyroxine requested. Advised it was sent on 07/06/21 #30/1 refill(s). He says the patient has 2 profiles and this Rx is available to refill and pickup tomorrow. I called the patient and advised, she verbalized understanding.

## 2021-08-04 NOTE — Telephone Encounter (Signed)
Copied from [place] [id number]. Topic: Quick Communication - Rx Refill/Question >> Aug 04, 2021  3:39 PM Yvette Rack wrote: Pt stated her Rx for levothyroxine (SYNTHROID) 75 MCG tablet runs out on 08/10/21 and she was told by her pharmacy that they could not refill the Rx and to contact pcp. Pt stated she needs her medication refilled because her appt is not until 08/17/21.   Medication: levothyroxine (SYNTHROID) 75 MCG tablet  Has the patient contacted their pharmacy? Yes.   (Agent: If no, request that the patient contact the pharmacy for the refill.) (Agent: If yes, when and what did the pharmacy advise?)  Preferred Pharmacy (with phone number or street name): [facility] [place], [place] - [address] AT [address] Phone: [phone]  Fax: [phone]  Agent: Please be advised that RX refills may take up to 3 business days. We ask that you follow-up with your pharmacy.

## 2021-08-04 NOTE — Progress Notes (Signed)
Patient ID: Stacy Mercado, female   DOB: January 27, 1946, 75 y.o.   MRN: [id number]        Chief Complaint: Right renal mass, post right sided renal biopsy and cryoablation (10/27/20)   Referring Physician(s): Hollice Espy (Urology) Park Liter (PCP)   History of Present Illness: Stacy Mercado is a 75 y.o. female with past medical history significant for diabetes, hypertension, hyperlipidemia and bladder cancer who underwent technically successful right-sided renal cryoablation and biopsy on 10/27/2020 with biopsy compatible with oncocytoma.    The patient is seen today in telemedicine consultation for postprocedural evaluation and management following acquisition of additional postprocedural renal protocol CT scan performed 07/15/2021.   She is currently without complaint.  Past Medical History:  Diagnosis Date   Arthritis    Bladder cancer ([place]) 2017   Chronic low back pain    Depression    Diabetes mellitus without complication ([place])    History of blood transfusion    History of iron deficiency anemia    History of pleurisy    History of pneumonia    Hyperlipidemia 08/07/2016   Hypertension    Hypothyroidism    Migraines    Personal history of chemotherapy    Bladder Ca   Urge incontinence of urine 08/07/2016    Past Surgical History:  Procedure Laterality Date   ABDOMINAL HYSTERECTOMY     APPENDECTOMY  1961   bladder cancer removed  2014   BOTOX INJECTION N/A 11/08/2020   Procedure: BOTOX INJECTION;  Surgeon: Hollice Espy, MD;  Location: [facility];  Service: Urology;  Laterality: N/A;   CATARACT EXTRACTION W/ INTRAOCULAR LENS  IMPLANT, BILATERAL     complete hysterectomy  1999   DILATION AND CURETTAGE OF UTERUS  1999   IR RADIOLOGIST EVAL & MGMT  07/20/2020   IR RADIOLOGIST EVAL & MGMT  10/19/2020   IR RADIOLOGIST EVAL & MGMT  12/02/2020   IR RADIOLOGIST EVAL & MGMT  03/15/2021   L rotator cuff Left 2010   R thumb ruptured ligament and tendon  2012    RADIOLOGY WITH ANESTHESIA Right 10/27/2020   Procedure: IR WITH ANESTHESIA RIGHT RENAL CRYOABLATION;  Surgeon: Sandi Mariscal, MD;  Location: [facility];  Service: Anesthesiology;  Laterality: Right;   REVERSE SHOULDER ARTHROPLASTY Right 11/20/2017   Procedure: REVERSE SHOULDER ARTHROPLASTY;  Surgeon: Corky Mull, MD;  Location: [facility];  Service: Orthopedics;  Laterality: Right;   SHOULDER ARTHROSCOPY WITH BICEPSTENOTOMY Right 01/30/2017   Procedure: SHOULDER ARTHROSCOPY WITH BICEPSTENOTOMY;  Surgeon: Corky Mull, MD;  Location: [facility];  Service: Orthopedics;  Laterality: Right;  biceps tenolysis   SHOULDER ARTHROSCOPY WITH OPEN ROTATOR CUFF REPAIR Right 01/30/2017   Procedure: SHOULDER ARTHROSCOPY WITH OPEN ROTATOR CUFF REPAIR;  Surgeon: Corky Mull, MD;  Location: [facility];  Service: Orthopedics;  Laterality: Right;  Massive rotator cuff tear repair    Allergies: Statins  Medications: Prior to Admission medications   Medication Sig Start Date End Date Taking? Authorizing Provider  aspirin 81 MG chewable tablet Chew 81 mg by mouth daily.     [provider]  baclofen (LIORESAL) 10 MG tablet Take 1 tablet (10 mg total) by mouth at bedtime. 07/04/21   Johnson, Megan P, DO  escitalopram (LEXAPRO) 10 MG tablet Take 1 tablet (10 mg total) by mouth daily. 07/04/21   Johnson, Megan P, DO  gabapentin (NEURONTIN) 300 MG capsule TAKE 1 CAPSULE(300 MG) BY MOUTH AT BEDTIME 07/04/21   Johnson, Megan P, DO  levothyroxine (SYNTHROID)  75 MCG tablet Take 1 tablet (75 mcg total) by mouth daily before breakfast. 07/06/21   Johnson, Megan P, DO  Lidocaine-Menthol (ICY HOT LIDOCAINE PLUS MENTHOL EX) Apply 1 application topically 2 (two) times daily as needed (muscle pain).    [provider]  losartan (COZAAR) 25 MG tablet TAKE 1 TABLET(25 MG) BY MOUTH DAILY 07/04/21   Johnson, Megan P, DO  metFORMIN (GLUCOPHAGE-XR) 500 MG 24 hr tablet TAKE 2 TABLETS(1000 MG) BY MOUTH IN THE MORNING AND AT BEDTIME  07/04/21   Johnson, Megan P, DO  mirabegron ER ([name]) 50 MG TB24 tablet Take 1 tablet (50 mg total) by mouth daily. 01/10/21   Vaillancourt, Aldona Bar, PA-C  montelukast (SINGULAIR) 10 MG tablet Take 1 tablet (10 mg total) by mouth at bedtime. 07/04/21   Park Liter P, DO  Multiple Vitamin (MULTIVITAMIN WITH MINERALS) TABS tablet Take 1 tablet by mouth daily.    [provider]  naproxen (NAPROSYN) 500 MG tablet TAKE 1 TABLET(500 MG) BY MOUTH TWICE DAILY WITH A MEAL 07/04/21   Valerie Roys, DO     Family History  Problem Relation Age of Onset   Hypertension Brother    Diabetes Brother    Healthy Son    Healthy Son    Healthy Daughter    Kidney cancer Neg Hx    Bladder Cancer Neg Hx    Breast cancer Neg Hx     Social History   Socioeconomic History   Marital status: Divorced    Spouse name: Not on file   Number of children: Not on file   Years of education: Not on file   Highest education level: Not on file  Occupational History   Occupation: retired  Tobacco Use   Smoking status: Former    Years: 50.00    Types: Cigarettes    Quit date: 12/18/2006    Years since quitting: 14.6   Smokeless tobacco: Never  Vaping Use   Vaping Use: Never used  Substance and Sexual Activity   Alcohol use: Yes    Alcohol/week: 7.0 standard drinks    Types: 7 Shots of liquor per week    Comment: glass of liquor nightly   Drug use: No   Sexual activity: Not on file  Other Topics Concern   Not on file  Social History Narrative   Not on file   Social Determinants of Health   Financial Resource Strain: Low Risk    Difficulty of Paying Living Expenses: Not hard at all  Food Insecurity: No Food Insecurity   Worried About Charity fundraiser in the Last Year: Never true   [place] in the Last Year: Never true  Transportation Needs: No Transportation Needs   Lack of Transportation (Medical): No   Lack of Transportation (Non-Medical): No  Physical Activity: Inactive    Days of Exercise per Week: 0 days   Minutes of Exercise per Session: 0 min  Stress: No Stress Concern Present   Feeling of Stress : Not at all  Social Connections: Not on file    ECOG Status: 0 - Asymptomatic  Review of Systems  Review of Systems: A 12 point ROS discussed and pertinent positives are indicated in the HPI above.  All other systems are negative.  Physical Exam No direct physical exam was performed (except for noted visual exam findings with Video Visits).   Vital Signs: There were no vitals taken for this visit.  Imaging:  Selected images from the  following examinations were reviewed in detail:   CT abdomen and pelvis -07/15/2021; 03/11/2021; 10/12/2020 image guided right renal cryoablation and biopsy - 10/27/2020   Review of CT scan of the abdomen pelvis performed 07/15/2021 demonstrates a technically excellent result without evidence of residual or recurrent disease.    Labs:  CBC: Recent Labs    10/25/20 1323 10/27/20 0750 07/04/21 1112  WBC 5.9 6.6 6.6  HGB 12.5 13.3 12.6  HCT 39.2 41.6 38.8  PLT 180 186 173    COAGS: Recent Labs    10/27/20 0750  INR 0.9    BMP: Recent Labs    10/25/20 1323 10/27/20 0750 01/11/21 1000 03/11/21 1144 07/04/21 1112 07/15/21 1138  NA 141 140 144  --  140  --   K 4.2 4.5 4.6  --  4.3  --   CL 105 103 103  --  102  --   CO2 '29 27 26  '$ --  23  --   GLUCOSE 154* 149* 167*  --  165*  --   BUN '20 19 14  '$ --  18  --   CALCIUM 10.1 10.2 10.5*  --  10.4*  --   CREATININE 0.78 0.69 0.79 0.70 0.81 0.80  GFRNONAA >60 >60 73  --   --   --   GFRAA  --   --  84  --   --   --     LIVER FUNCTION TESTS: Recent Labs    01/11/21 1000 07/04/21 1112  BILITOT 0.5 0.5  AST 22 19  ALT 23 23  ALKPHOS 69 64  PROT 6.3 6.3  ALBUMIN 4.1 4.1    TUMOR MARKERS: No results for input(s): AFPTM, CEA, CA199, CHROMGRNA in the last 8760 hours.  Assessment and Plan:  Stacy Mercado is a 75 y.o. female with past medical  history significant for diabetes, hypertension, hyperlipidemia and bladder cancer who underwent technically successful right-sided renal cryoablation and biopsy on 10/27/2020 with biopsy compatible with oncocytoma.   Selected images from the following examinations were reviewed in detail:   CT abdomen and pelvis -07/15/2021; 03/11/2021; 10/12/2020 image guided right renal cryoablation and biopsy - 10/27/2020   Review of CT scan of the abdomen pelvis performed 07/15/2021 demonstrates a technically excellent result without evidence of residual or recurrent disease.    I explained that while oncocytoma is considered a benign renal lesion, there is a small chance of a sampling error and as such we will maintain our diligence with surveillance imaging until we document 3 years of stability.   Given lack of worrisome features on thus far aquired post procedural scans, we will obtain our next postprocedural surveillance imaging in 6 months (Feb 2023).    (Note, as previously discussed, the patient wishes to pursue surveillance imaging with CT given its convenience.  I think this is a reasonable option given her advanced age and as such we will pursue surveillance with renal protocol CT and will reserve MRI for problem-solving purposes.)   The patient demonstrated excellent understanding and is in agreement with the above plan of care.  The patient knows to call the interventional radiology clinic with any interval questions or concerns.   Plan: - Postprocedural renal protocol CT scan performed in 6 months (February 2023).    A copy of this report was sent to the requesting provider on this date.  Electronically Signed: Sandi Mariscal 08/04/2021, 10:43 AM   I spent a total of 5 Minutes in remote  clinical  consultation, greater than 50% of which was counseling/coordinating care for post right sided renal cryoablation.    Visit type: Audio only (telephone). Audio (no video) only due to patient's lack of  internet/smartphone capability. Alternative for in-person consultation at [facility], [address], [place], [place]. This visit type was conducted due to national recommendations for restrictions regarding the COVID-19 Pandemic (e.g. social distancing).  This format is felt to be most appropriate for this patient at this time.  All issues noted in this document were discussed and addressed.

## 2021-08-17 ENCOUNTER — Other Ambulatory Visit: Payer: Medicare Other

## 2021-08-17 ENCOUNTER — Other Ambulatory Visit: Payer: Self-pay

## 2021-08-17 DIAGNOSIS — E039 Hypothyroidism, unspecified: Secondary | ICD-10-CM

## 2021-08-18 ENCOUNTER — Other Ambulatory Visit: Payer: Self-pay | Admitting: Family Medicine

## 2021-08-18 LAB — TSH: TSH: 4.08 u[IU]/mL (ref 0.450–4.500)

## 2021-08-18 MED ORDER — LEVOTHYROXINE SODIUM 75 MCG PO TABS: 75.0000 ug | ORAL_TABLET | Freq: Every day | ORAL | 3 refills | Status: AC

## 2021-09-01 ENCOUNTER — Ambulatory Visit (INDEPENDENT_AMBULATORY_CARE_PROVIDER_SITE_OTHER): Payer: Medicare Other | Admitting: Nurse Practitioner

## 2021-09-01 ENCOUNTER — Encounter: Payer: Self-pay | Admitting: Nurse Practitioner

## 2021-09-01 ENCOUNTER — Ambulatory Visit
Admission: RE | Admit: 2021-09-01 | Discharge: 2021-09-01 | Disposition: A | Discharge status: Home / Self Care | Payer: Medicare Other | Source: Ambulatory Visit | Attending: Nurse Practitioner | Admitting: Nurse Practitioner

## 2021-09-01 ENCOUNTER — Ambulatory Visit
Admission: RE | Admit: 2021-09-01 | Discharge: 2021-09-01 | Disposition: A | Discharge status: Home / Self Care | Payer: Medicare Other | Attending: Nurse Practitioner | Admitting: Nurse Practitioner

## 2021-09-01 ENCOUNTER — Other Ambulatory Visit: Payer: Self-pay

## 2021-09-01 VITALS — BP 126/74 | HR 65 | Temp 98.6°F | Wt 182.0 lb

## 2021-09-01 DIAGNOSIS — M5412 Radiculopathy, cervical region: Secondary | ICD-10-CM | POA: Diagnosis not present

## 2021-09-01 DIAGNOSIS — M50121 Cervical disc disorder at C4-C5 level with radiculopathy: Secondary | ICD-10-CM | POA: Diagnosis not present

## 2021-09-01 DIAGNOSIS — M50123 Cervical disc disorder at C6-C7 level with radiculopathy: Secondary | ICD-10-CM | POA: Diagnosis not present

## 2021-09-01 DIAGNOSIS — Z23 Encounter for immunization: Secondary | ICD-10-CM | POA: Diagnosis not present

## 2021-09-01 DIAGNOSIS — M4722 Other spondylosis with radiculopathy, cervical region: Secondary | ICD-10-CM | POA: Diagnosis not present

## 2021-09-01 MED ORDER — KETOROLAC TROMETHAMINE 30 MG/ML IJ SOLN
30.0000 mg | Freq: Once | INTRAMUSCULAR | Status: AC
  Administered 2021-09-01: 30 mg via INTRAMUSCULAR

## 2021-09-01 MED ORDER — GABAPENTIN 300 MG PO CAPS: 300.0000 mg | ORAL_CAPSULE | Freq: Two times a day (BID) | ORAL | 1 refills | Status: DC

## 2021-09-01 NOTE — Assessment & Plan Note (Addendum)
Acute x3 days. Will order cervical spine x-ray. Can increase gabapentin to '300mg'$  BID. Will give toradol '30mg'$  IM x1. Continue naproxen, baclofen, and heat/ice. Exercises printed. F/U if symptoms don't improve or worsen.

## 2021-09-01 NOTE — Patient Instructions (Addendum)
Increase your gabapentin to twice a day. Get an x-ray of your neck at [facility]

## 2021-09-01 NOTE — Progress Notes (Signed)
Acute Office Visit  Subjective:    Patient ID: Stacy Mercado, female    DOB: [birth year], 75 y.o.   MRN: [id number]  Chief Complaint  Patient presents with   Shoulder Pain    Patient states Monday morning she woke and the back of her shoulder was hurting. Patient states it is a constant dull pain and then there is a sharp pain. Patient states she cannot lean against or sleep it is just painful. Patient states she has tried Aspirin and cold/warm compress.    HPI Patient is in today for left shoulder pain since Monday.   SHOULDER PAIN Duration: days Involved shoulder: left Mechanism of injury: unknown Location: posterior Onset:gradual Severity: 5/10  Quality:  sharp and dull Frequency: constant Radiation: yes Aggravating factors: sleep  Alleviating factors: nothing  Status: fluctuating Treatments attempted:  aspirin, rest, ice, and heat , ibuprofen Relief with NSAIDs?:  no Weakness: no Numbness: yes- intermittent in left arm Decreased grip strength: no Redness: no Swelling: no Bruising: no Fevers: no   Past Medical History:  Diagnosis Date   Arthritis    Bladder cancer ([place]) 2017   Chronic low back pain    Depression    Diabetes mellitus without complication ([facility])    History of blood transfusion    History of iron deficiency anemia    History of pleurisy    History of pneumonia    Hyperlipidemia 08/07/2016   Hypertension    Hypothyroidism    Migraines    Personal history of chemotherapy    Bladder Ca   Urge incontinence of urine 08/07/2016    Past Surgical History:  Procedure Laterality Date   ABDOMINAL HYSTERECTOMY     APPENDECTOMY  1961   bladder cancer removed  2014   BOTOX INJECTION N/A 11/08/2020   Procedure: BOTOX INJECTION;  Surgeon: Hollice Espy, MD;  Location: [facility];  Service: Urology;  Laterality: N/A;   CATARACT EXTRACTION W/ INTRAOCULAR LENS  IMPLANT, BILATERAL     complete hysterectomy  1999   DILATION AND CURETTAGE OF UTERUS   1999   IR RADIOLOGIST EVAL & MGMT  07/20/2020   IR RADIOLOGIST EVAL & MGMT  10/19/2020   IR RADIOLOGIST EVAL & MGMT  12/02/2020   IR RADIOLOGIST EVAL & MGMT  03/15/2021   IR RADIOLOGIST EVAL & MGMT  08/04/2021   L rotator cuff Left 2010   R thumb ruptured ligament and tendon  2012   RADIOLOGY WITH ANESTHESIA Right 10/27/2020   Procedure: IR WITH ANESTHESIA RIGHT RENAL CRYOABLATION;  Surgeon: Sandi Mariscal, MD;  Location: [facility];  Service: Anesthesiology;  Laterality: Right;   REVERSE SHOULDER ARTHROPLASTY Right 11/20/2017   Procedure: REVERSE SHOULDER ARTHROPLASTY;  Surgeon: Corky Mull, MD;  Location: [facility];  Service: Orthopedics;  Laterality: Right;   SHOULDER ARTHROSCOPY WITH BICEPSTENOTOMY Right 01/30/2017   Procedure: SHOULDER ARTHROSCOPY WITH BICEPSTENOTOMY;  Surgeon: Corky Mull, MD;  Location: [facility];  Service: Orthopedics;  Laterality: Right;  biceps tenolysis   SHOULDER ARTHROSCOPY WITH OPEN ROTATOR CUFF REPAIR Right 01/30/2017   Procedure: SHOULDER ARTHROSCOPY WITH OPEN ROTATOR CUFF REPAIR;  Surgeon: Corky Mull, MD;  Location: [facility];  Service: Orthopedics;  Laterality: Right;  Massive rotator cuff tear repair    Family History  Problem Relation Age of Onset   Hypertension Brother    Diabetes Brother    Healthy Son    Healthy Son    Healthy Daughter    Kidney cancer Neg Hx  Bladder Cancer Neg Hx    Breast cancer Neg Hx     Social History   Socioeconomic History   Marital status: Divorced    Spouse name: Not on file   Number of children: Not on file   Years of education: Not on file   Highest education level: Not on file  Occupational History   Occupation: retired  Tobacco Use   Smoking status: Former    Years: 50.00    Types: Cigarettes    Quit date: 12/18/2006    Years since quitting: 14.7   Smokeless tobacco: Never  Vaping Use   Vaping Use: Never used  Substance and Sexual Activity   Alcohol use: Yes    Alcohol/week: 7.0 standard drinks    Types: 7  Shots of liquor per week    Comment: glass of liquor nightly   Drug use: No   Sexual activity: Not on file  Other Topics Concern   Not on file  Social History Narrative   Not on file   Social Determinants of Health   Financial Resource Strain: Low Risk    Difficulty of Paying Living Expenses: Not hard at all  Food Insecurity: No Food Insecurity   Worried About Charity fundraiser in the Last Year: Never true   [place] in the Last Year: Never true  Transportation Needs: No Transportation Needs   Lack of Transportation (Medical): No   Lack of Transportation (Non-Medical): No  Physical Activity: Inactive   Days of Exercise per Week: 0 days   Minutes of Exercise per Session: 0 min  Stress: No Stress Concern Present   Feeling of Stress : Not at all  Social Connections: Not on file  Intimate Partner Violence: Not on file    Outpatient Medications Prior to Visit  Medication Sig Dispense Refill   aspirin 81 MG chewable tablet Chew 81 mg by mouth daily.      baclofen (LIORESAL) 10 MG tablet Take 1 tablet (10 mg total) by mouth at bedtime. 90 each 1   escitalopram (LEXAPRO) 10 MG tablet Take 1 tablet (10 mg total) by mouth daily. 90 tablet 1   levothyroxine (SYNTHROID) 75 MCG tablet Take 1 tablet (75 mcg total) by mouth daily before breakfast. 90 tablet 3   Lidocaine-Menthol (ICY HOT LIDOCAINE PLUS MENTHOL EX) Apply 1 application topically 2 (two) times daily as needed (muscle pain).     losartan (COZAAR) 25 MG tablet TAKE 1 TABLET(25 MG) BY MOUTH DAILY 90 tablet 1   metFORMIN (GLUCOPHAGE-XR) 500 MG 24 hr tablet TAKE 2 TABLETS(1000 MG) BY MOUTH IN THE MORNING AND AT BEDTIME 360 tablet 1   mirabegron ER ([name]) 50 MG TB24 tablet Take 1 tablet (50 mg total) by mouth daily. 28 tablet 0   montelukast (SINGULAIR) 10 MG tablet Take 1 tablet (10 mg total) by mouth at bedtime. 90 tablet 3   Multiple Vitamin (MULTIVITAMIN WITH MINERALS) TABS tablet Take 1 tablet by mouth daily.      naproxen (NAPROSYN) 500 MG tablet TAKE 1 TABLET(500 MG) BY MOUTH TWICE DAILY WITH A MEAL 60 tablet 6   gabapentin (NEURONTIN) 300 MG capsule TAKE 1 CAPSULE(300 MG) BY MOUTH AT BEDTIME 90 capsule 1   No facility-administered medications prior to visit.    Allergies  Allergen Reactions   Statins Other (See Comments)    myalgia    Review of Systems  Constitutional:  Positive for fatigue.  HENT: Negative.    Respiratory: Negative.  Cardiovascular: Negative.   Gastrointestinal: Negative.   Genitourinary: Negative.   Musculoskeletal:  Positive for arthralgias (left shoulder).  Skin: Negative.   Neurological: Negative.       Objective:    Physical Exam Vitals and nursing note reviewed.  Constitutional:      General: She is not in acute distress.    Appearance: Normal appearance.  HENT:     Head: Normocephalic.  Eyes:     Conjunctiva/sclera: Conjunctivae normal.  Cardiovascular:     Rate and Rhythm: Normal rate and regular rhythm.     Pulses: Normal pulses.  Pulmonary:     Effort: Pulmonary effort is normal.  Musculoskeletal:        General: No tenderness.     Cervical back: Normal range of motion.     Comments: Limited ROM to left shoulder, however she says that this is normal since her rotator cuff surgery.   Skin:    General: Skin is warm.  Neurological:     General: No focal deficit present.     Mental Status: She is alert and oriented to person, place, and time.  Psychiatric:        Mood and Affect: Mood normal.        Behavior: Behavior normal.        Thought Content: Thought content normal.        Judgment: Judgment normal.    BP 126/74   Pulse 65   Temp 98.6 F (37 C) (Oral)   Wt 182 lb (82.6 kg)   SpO2 99%   BMI 30.66 kg/m  Wt Readings from Last 3 Encounters:  09/01/21 182 lb (82.6 kg)  07/04/21 186 lb 9.6 oz (84.6 kg)  05/10/21 183 lb (83 kg)    Health Maintenance Due  Topic Date Due   Zoster Vaccines- Shingrix (1 of 2) Never done    OPHTHALMOLOGY EXAM  09/12/2017   FOOT EXAM  03/19/2019   INFLUENZA VACCINE  07/18/2021   COVID-19 Vaccine (5 - Booster for Pfizer series) 07/27/2021    There are no preventive care reminders to display for this patient.   Lab Results  Component Value Date   TSH 4.080 08/17/2021   Lab Results  Component Value Date   WBC 6.6 07/04/2021   HGB 12.6 07/04/2021   HCT 38.8 07/04/2021   MCV 92 07/04/2021   PLT 173 07/04/2021   Lab Results  Component Value Date   NA 140 07/04/2021   K 4.3 07/04/2021   CO2 23 07/04/2021   GLUCOSE 165 (H) 07/04/2021   BUN 18 07/04/2021   CREATININE 0.80 07/15/2021   BILITOT 0.5 07/04/2021   ALKPHOS 64 07/04/2021   AST 19 07/04/2021   ALT 23 07/04/2021   PROT 6.3 07/04/2021   ALBUMIN 4.1 07/04/2021   CALCIUM 10.4 (H) 07/04/2021   ANIONGAP 10 10/27/2020   EGFR 75 07/04/2021   Lab Results  Component Value Date   CHOL 152 07/04/2021   Lab Results  Component Value Date   HDL 47 07/04/2021   Lab Results  Component Value Date   LDLCALC 75 07/04/2021   Lab Results  Component Value Date   TRIG 178 (H) 07/04/2021   No results found for: [facility] Lab Results  Component Value Date   HGBA1C 6.8 07/04/2021       Assessment & Plan:   Problem List Items Addressed This Visit       Nervous and Auditory   Cervical radiculopathy - Primary  Acute x3 days. Will order cervical spine x-ray. Can increase gabapentin to 394m BID. Will give toradol 335mIM x1. Continue naproxen, baclofen, and heat/ice. Exercises printed. F/U if symptoms don't improve or worsen.       Relevant Medications   gabapentin (NEURONTIN) 300 MG capsule   Other Relevant Orders   DG Cervical Spine Complete     Meds ordered this encounter  Medications   ketorolac (TORADOL) 30 MG/ML injection 30 mg   gabapentin (NEURONTIN) 300 MG capsule    Sig: Take 1 capsule (300 mg total) by mouth 2 (two) times daily. TAKE 1 CAPSULE(300 MG) BY MOUTH AT BEDTIME    Dispense:  90  capsule    Refill:  1 RavenelNP

## 2021-09-19 ENCOUNTER — Other Ambulatory Visit: Payer: Self-pay | Admitting: Physician Assistant

## 2021-09-19 DIAGNOSIS — N3281 Overactive bladder: Secondary | ICD-10-CM

## 2021-09-20 IMAGING — CT CT BIOPSY
1 of 14 series · 4 of 32 positions shown, 9 images · non-contrast
Comparison: none

CLINICAL DATA: Right-sided renal cell carcinoma. Patient presents
today for image guided cryoablation of potential biopsy.

[Series 2: i-spiral 5.0 bf37 · axial · 0.98mm/px · z∈[-154,-17]mm · 4 of 65 slices shown, 9 images]
[im 13/65  soft-tissue]
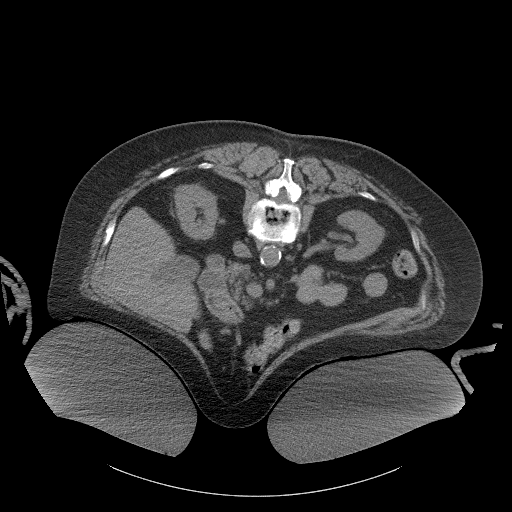
[im 13/65  lung]
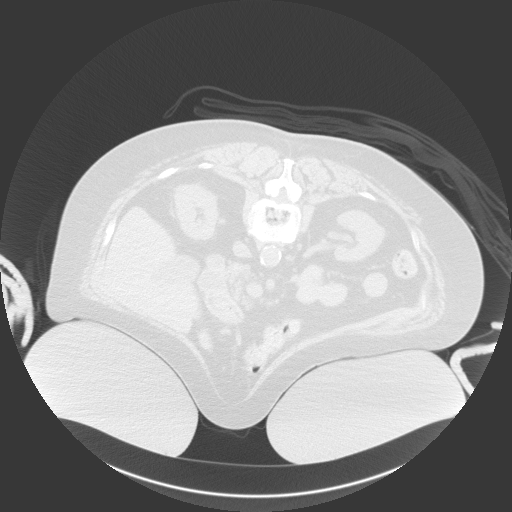
[im 13/65  bone]
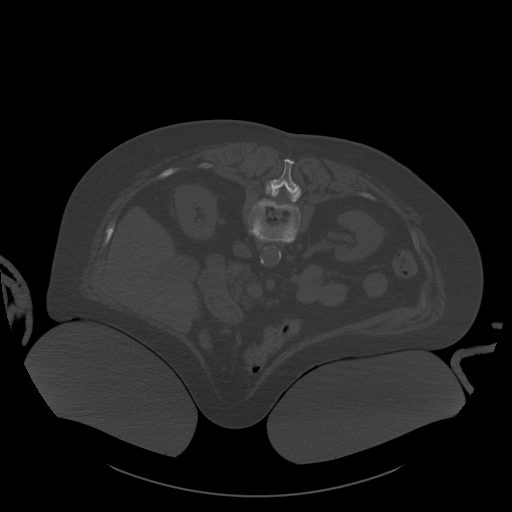
[im 26/65  soft-tissue]
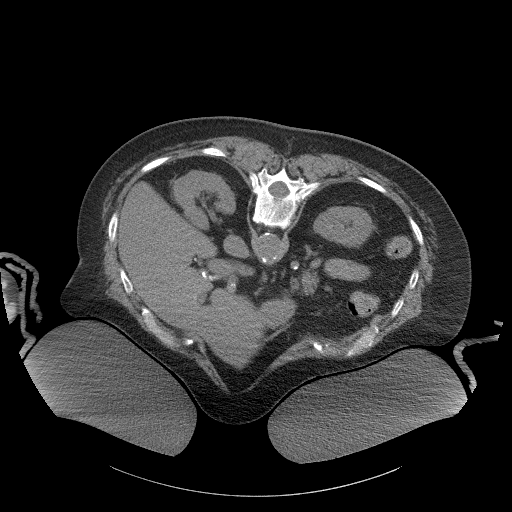
[im 26/65  lung]
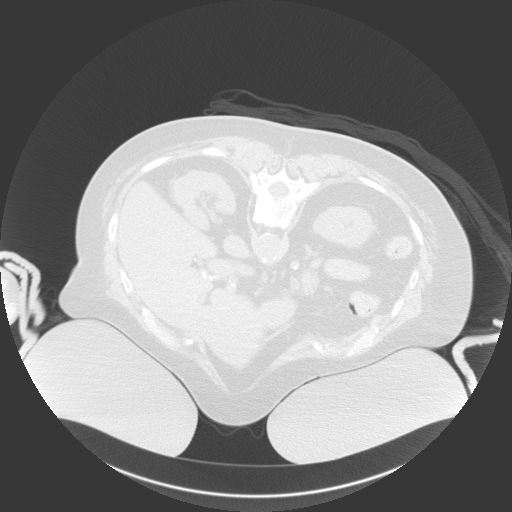
[im 39/65  soft-tissue]
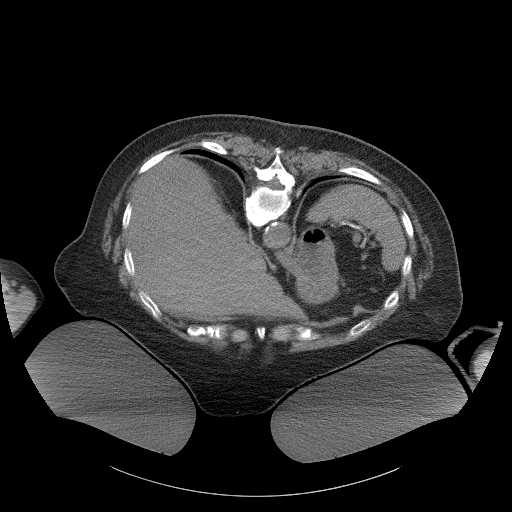
[im 39/65  lung]
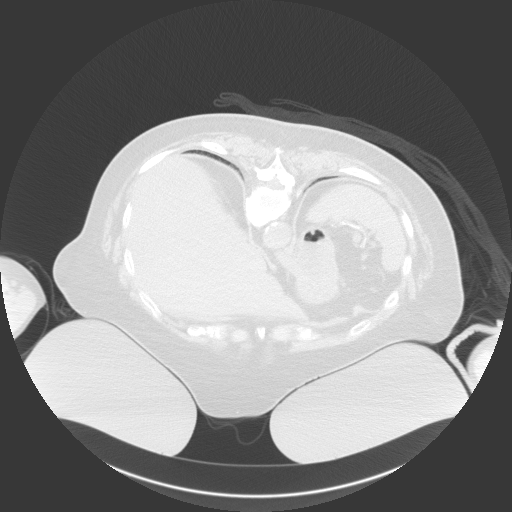
[im 52/65  soft-tissue]
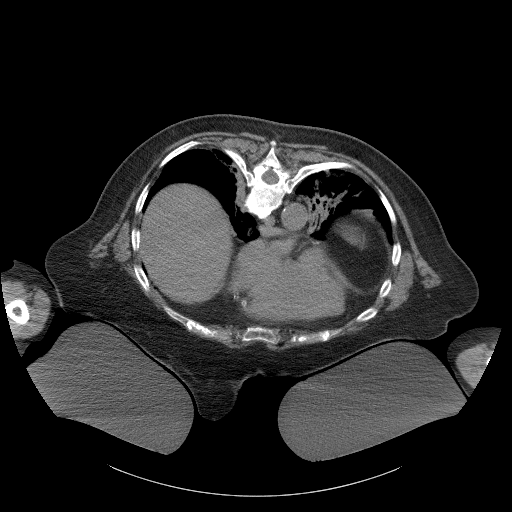
[im 52/65  lung]
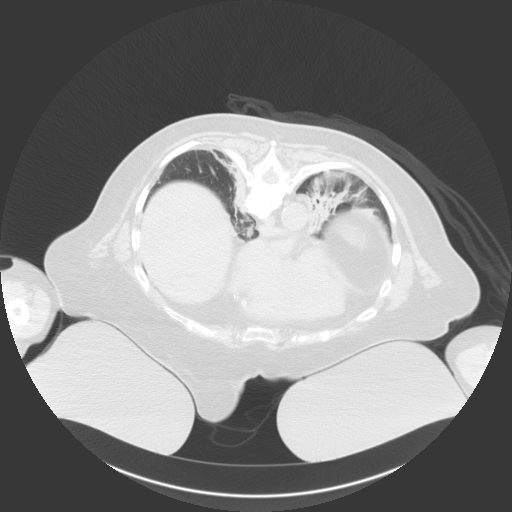

[4 of 32 positions shown; findings below may reference images not displayed]

EXAM:
ULTRASOUND AND CT-GUIDED RIGHT RENAL CRYOABLATION AND BIOPSY

ANESTHESIA/SEDATION:
General

MEDICATIONS:
Ancef 2 gm IV .The antibiotic was administered in an appropriate
time interval prior to needle puncture of the skin.

CONTRAST:  None

PROCEDURE:
The procedure, risks, benefits, and alternatives were explained to
the patient. Questions regarding the procedure were encouraged and
answered. The patient understands and consents to the procedure.

The patient was placed under general anesthesia. Initial un-enhanced
CT was performed in a prone position to localize the grossly
unchanged approximately 2.8 x 2.5 cm partially exophytic lesion
arising from the interpolar aspect the right kidney (image 46,
series 2). The table position was marked and lesion was also
identified sonographically.

Next, the right flank was prepped and draped in usual sterile
fashion, and a sterile drape was applied covering the operative
field.

Under direct ultrasound guidance, a 22 gauge spinal needle was
utilized for procedural planning purposes. Appropriate trajectory
was confirmed with limited CT imaging.

Next, under a combination of both ultrasound and CT guidance, 2 ice
force 2.1 SYNG cryoablation probes were placed bracketing the superior
and inferior aspects of the partially exophytic renal lesion.
Appropriate positioning was confirmed and both probes were stuck in
place

Next, a 17 gauge trocar needle was advanced adjacent to the
partially exophytic right-sided renal lesion and 2 core needle
biopsies were obtained with an 18 gauge core needle biopsy device.
Samples were submitted in formalin for pathologic analysis.

Next, the 17 gauge trocar needle was utilized for the administration
of hydrodissection. Adequate hydrodissection was demonstrated on
subsequent CT imaging and the cryoablation was initiated.

The cryoablation was performed with an initial 10 minute cycle of
cryoablation, 6 minutes active thaw and a final 10 minutes cycle of
cryoablation. Note, CT imaging was performed at the approximately 8
minute mark of the initial cryo ablation.

Next, cautery function was utilized for both probes and both probes
were removed intact. Post-procedural CT was performed and the
procedure was terminated.

Access sites were anesthetized 1% lidocaine with epinephrine.
Dressings were applied. The patient tolerated the procedure well
without immediate postprocedural complication.

COMPLICATIONS:
None immediate.
FINDINGS: Initial precontrast images demonstrates grossly unchanged size and
appearance of the approximately 2.8 x 2.5 cm partially exophytic
lesion arising from the lateral interpolar aspect of the right
kidney (image 46, series 2). Utilizing a combination of ultrasound
and CT guidance, 2 cryoablation probes were placed bracketing the
superior and inferior aspects of the partially exophytic lesion.

Next, CT guidance was utilized to obtain 218 gauge core needle
biopsy devices. Following the administration of hydrodissection, the
cryoablation was performed with intra procedural imaging
demonstrating the ice ball encompassing the entirety of the
partially exophytic right-sided renal lesion.

Completion imaging is negative for evidence of complication,
specifically, no evidence of significant perinephric hemorrhage.
IMPRESSION: Technically successful ultrasound and CT-guided percutaneous biopsy
and cryoablation of partially exophytic right-sided renal lesion.

PLAN:
- The patient will be admitted overnight for continued observation
and PCA usage.

- Telemedicine consultation will be arranged for 4 weeks.

- Initial postprocedural imaging will be performed in 3 months (mid
January 2021).

## 2021-09-23 ENCOUNTER — Telehealth: Payer: Self-pay

## 2021-09-23 NOTE — Chronic Care Management (AMB) (Signed)
Chronic Care Management Pharmacy Assistant   Name: Stacy Mercado  MRN: [id number] DOB: [birth year]  Reason for Encounter: Disease State General  Recent office visits:  09/01/21-Stacy Avelina Laine, NP. Seen for shoulder pain. Xray ordered. Increase gabapentin to 300mg  BID. Toradol injection 30 mg given. 07/04/21-Stacy Annia Friendly, DO (PCP) General follow up visit. Labs ordered. Follow up in 6 months.  Recent consult visits:  07/11/21-Stacy Mercado, PT (Rehabilitation) 06/30/21-Stacy Mercado, PT (Rehabilitation) 06/28/21-Stacy Mercado (Otolaryngology) Notes not available. 06/23/21-Stacy Mercado, PT (Rehabilitation) 05/30/21-Stacy Mercado, PT (Rehabilitation) 05/18/21-Stacy Mercado, PT (Rehabilitation) 05/10/21-Stacy Vaillancouty, PA-C (Urology) Seen for over active bladder. Bladder scan completed. Follow up in 5 months. 05/10/21-Stacy Mercado, PT (Rehabilitation) 05/03/21-Stacy Mercado, PT (Rehabilitation)  Hospital visits:  None in previous 6 months  Medications: Outpatient Encounter Medications as of 09/23/2021  Medication Sig   aspirin 81 MG chewable tablet Chew 81 mg by mouth daily.    baclofen (LIORESAL) 10 MG tablet Take 1 tablet (10 mg total) by mouth at bedtime.   escitalopram (LEXAPRO) 10 MG tablet Take 1 tablet (10 mg total) by mouth daily.   gabapentin (NEURONTIN) 300 MG capsule Take 1 capsule (300 mg total) by mouth 2 (two) times daily. TAKE 1 CAPSULE(300 MG) BY MOUTH AT BEDTIME   levothyroxine (SYNTHROID) 75 MCG tablet Take 1 tablet (75 mcg total) by mouth daily before breakfast.   Lidocaine-Menthol (ICY HOT LIDOCAINE PLUS MENTHOL EX) Apply 1 application topically 2 (two) times daily as needed (muscle pain).   losartan (COZAAR) 25 MG tablet TAKE 1 TABLET(25 MG) BY MOUTH DAILY   metFORMIN (GLUCOPHAGE-XR) 500 MG 24 hr tablet TAKE 2 TABLETS(1000 MG) BY MOUTH IN THE MORNING AND AT BEDTIME   mirabegron ER ([name]) 50 MG TB24 tablet  Take 1 tablet (50 mg total) by mouth daily.   montelukast (SINGULAIR) 10 MG tablet Take 1 tablet (10 mg total) by mouth at bedtime.   Multiple Vitamin (MULTIVITAMIN WITH MINERALS) TABS tablet Take 1 tablet by mouth daily.   naproxen (NAPROSYN) 500 MG tablet TAKE 1 TABLET(500 MG) BY MOUTH TWICE DAILY WITH A MEAL   No facility-administered encounter medications on file as of 09/23/2021.   Have you had any problems recently with your health? Patient states there is nothing at this time.  Have you had any problems with your pharmacy? Patient states her [name] medication cost her $300 that was prescribed by her Urologist. Patient states she usually gets samples but her urologist has ran out of samples and hasn't been taking them since then. She would like to know if [facility] can try to help with this issue.  What issues or side effects are you having with your medications? Patient states she has no issues or side effects to any of her medications.  What would you like me to pass along to Stacy Mercado,CPP for them to help you with?  Patient states her [name] medication cost her $300 that was prescribed by her Urologist. Patient states she usually gets samples but her urologist has ran out of samples and hasn't been taking them since then. She would like to know if CPP can try to help with this issue.  What can we do to take care of you better? Patient states there is nothing at this time.  Care Gaps: Zoster Vaccines- Shingrix:Overdue - never done OPHTHALMOLOGY EXAM:Last completed: Sep 12, 2016 FOOT EXAM:Last completed: Mar 18, 2018 INFLUENZA VACCINE:Last completed: Sep 25, 2020 COVID-19 Vaccine:Last completed: Mar 27, 2021  Star Rating Drugs: Losartan 25 mg Last filled:07/04/21 90 DS Metformin 500 mg Last filled:07/03/21 90 DS  Stacy Mercado, Stacy Mercado

## 2021-09-26 ENCOUNTER — Telehealth: Payer: Self-pay

## 2021-09-26 NOTE — Telephone Encounter (Signed)
Pt LM on triage line requesting samples of [name]. Please advise.

## 2021-09-27 NOTE — Telephone Encounter (Signed)
2 months of [name] 50mg  samples at the front desk for her to pick up, please notify patient.

## 2021-09-27 NOTE — Telephone Encounter (Signed)
Patient notified and expressed understanding.

## 2021-10-04 ENCOUNTER — Other Ambulatory Visit: Payer: Self-pay | Admitting: Family Medicine

## 2021-10-04 NOTE — Telephone Encounter (Signed)
Requested Prescriptions  Pending Prescriptions Disp Refills  . metFORMIN (GLUCOPHAGE-XR) 500 MG 24 hr tablet [Pharmacy Med Name: METFORMIN ER 500MG 24HR TABS] 360 tablet 0    Sig: TAKE 2 TABLETS(1000 MG) BY MOUTH IN THE MORNING AND AT BEDTIME     Endocrinology:  Diabetes - Biguanides Passed - 10/04/2021 12:12 PM      Passed - Cr in normal range and within 360 days    Creatinine, Ser  Date Value Ref Range Status  07/15/2021 0.80 0.44 - 1.00 mg/dL Final         Passed - HBA1C is between 0 and 7.9 and within 180 days    Hemoglobin A1C  Date Value Ref Range Status  05/24/2016 5.3  Final   HB A1C (BAYER DCA - WAIVED)  Date Value Ref Range Status  07/04/2021 6.8 <7.0 % Final    Comment:                                          Diabetic Adult            <7.0                                       Healthy Adult        4.3 - 5.7                                                           (DCCT/NGSP) American Diabetes Association's Summary of Glycemic Recommendations for Adults with Diabetes: Hemoglobin A1c <7.0%. More stringent glycemic goals (A1c <6.0%) may further reduce complications at the cost of increased risk of hypoglycemia.          Passed - eGFR in normal range and within 360 days    GFR calc Af Amer  Date Value Ref Range Status  01/11/2021 84 >59 mL/min/1.73 Final    Comment:    **In accordance with recommendations from the NKF-ASN Task force,**   [facility] is in the process of updating its eGFR calculation to the   2021 CKD-EPI creatinine equation that estimates kidney function   without a race variable.    GFR, Estimated  Date Value Ref Range Status  10/27/2020 >60 >60 mL/min Final    Comment:    (NOTE) Calculated using the CKD-EPI Creatinine Equation (2021)    GFR calc non Af Amer  Date Value Ref Range Status  01/11/2021 73 >59 mL/min/1.73 Final   eGFR  Date Value Ref Range Status  07/04/2021 75 >59 mL/min/1.73 Final         Passed - Valid encounter within  last 6 months    Recent Outpatient Visits          1 month ago Cervical radiculopathy   [place], Lauren A, NP   3 months ago Controlled type 2 diabetes mellitus without complication, without long-term current use of insulin ([place])   [place], Megan P, DO   8 months ago Dizziness   [place], Megan P, DO   8 months ago Controlled type 2 diabetes mellitus without complication, without  long-term current use of insulin ([place])   [place], Ullin, DO   9 months ago Dizziness   [place], Jake Church, DO      Future Appointments            In 6 days Debroah Loop, PA-C [facility]   In 3 months Johnson, Megan P, DO [employer], [place]   In 3 months  [facility], [place]           . gabapentin (NEURONTIN) 300 MG capsule [employer] Med Name: GABAPENTIN 300MG CAPSULES] 90 capsule 1    Sig: TAKE 1 CAPSULE(300 MG) BY MOUTH AT BEDTIME     Neurology: Anticonvulsants - gabapentin Passed - 10/04/2021 12:12 PM      Passed - Valid encounter within last 12 months    Recent Outpatient Visits          1 month ago Cervical radiculopathy   [place], Lauren A, NP   3 months ago Controlled type 2 diabetes mellitus without complication, without long-term current use of insulin ([place])   [place], Megan P, DO   8 months ago Dizziness   [place], Megan P, DO   8 months ago Controlled type 2 diabetes mellitus without complication, without long-term current use of insulin ([place])   [place], [place], DO   9 months ago [place], DO      Future Appointments            In 6 days Vaillanc[place], Aldona Bar, PA-C [facility]   In 3 months Johnson, Megan P, DO [facility], [place]    In 3 months  [facility], [facility]           . escitalopram (LEXAPRO) 10 MG tablet [employer] Med Name: ESCITALOPRAM 10MG TABLETS] 90 tablet 1    Sig: TAKE 1 TABLET(10 MG) BY MOUTH DAILY     Psychiatry:  Antidepressants - SSRI Passed - 10/04/2021 12:12 PM      Passed - Completed PHQ-2 or PHQ-9 in the last 360 days      Passed - Valid encounter within last 6 months    Recent Outpatient Visits          1 month ago Cervical radiculopathy   [place], Lauren A, NP   3 months ago Controlled type 2 diabetes mellitus without complication, without long-term current use of insulin ([place])   [place], Megan P, DO   8 months ago Dizziness   [place] P, DO   8 months ago Controlled type 2 diabetes mellitus without complication, without long-term current use of insulin ([place])   [place], [place], DO   9 months ago Dizziness   [place], Jake Church, DO      Future Appointments            In 6 days Debroah Loop, PA-C [facility]   In 3 months Johnson, [place], DO [facility], [place]   In 3 months  [facility], [facility]           . losartan (COZAAR) 25 MG tablet [Pharmacy Med Name: LOSARTAN 25MG TABLETS] 90 tablet 1    Sig: TAKE 1 TABLET(25 MG) BY MOUTH DAILY     Cardiovascular:  Angiotensin Receptor Blockers Passed -  10/04/2021 12:12 PM      Passed - Cr in normal range and within 180 days    Creatinine, Ser  Date Value Ref Range Status  07/15/2021 0.80 0.44 - 1.00 mg/dL Final         Passed - K in normal range and within 180 days    Potassium  Date Value Ref Range Status  07/04/2021 4.3 3.5 - 5.2 mmol/L Final         Passed - Patient is not pregnant      Passed - Last BP in normal range    BP Readings from Last 1 Encounters:  09/01/21 126/74         Passed - Valid encounter within last 6 months     Recent Outpatient Visits          1 month ago Cervical radiculopathy   [place], Lauren A, NP   3 months ago Controlled type 2 diabetes mellitus without complication, without long-term current use of insulin ([place])   [place], Megan P, DO   8 months ago Dizziness   [place], Megan P, DO   8 months ago Controlled type 2 diabetes mellitus without complication, without long-term current use of insulin ([place])   [place], [place], DO   9 months ago Dizziness   [place], Jake Church, DO      Future Appointments            In 6 days Debroah Loop, PA-C [facility]   In 3 months [place] Emery, Barb Merino, DO [facility], [place]   In 3 months  [facility], [place]

## 2021-10-10 ENCOUNTER — Ambulatory Visit (INDEPENDENT_AMBULATORY_CARE_PROVIDER_SITE_OTHER): Payer: Medicare Other | Admitting: Physician Assistant

## 2021-10-10 ENCOUNTER — Other Ambulatory Visit: Payer: Self-pay

## 2021-10-10 ENCOUNTER — Encounter: Payer: Self-pay | Admitting: Physician Assistant

## 2021-10-10 VITALS — BP 125/75 | HR 70 | Ht 65.0 in | Wt 183.0 lb

## 2021-10-10 DIAGNOSIS — N3281 Overactive bladder: Secondary | ICD-10-CM | POA: Diagnosis not present

## 2021-10-10 NOTE — Progress Notes (Signed)
10/10/2021 2:57 PM   Stacy Mercado 01-Mar-1946 [id number]  CC: Chief Complaint  Patient presents with   Over Active Bladder   HPI: Stacy Mercado is a 75 y.o. 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   Today she reports symptomatic worsening over the past 2 weeks notable for increased frequency, urgency, and urge incontinence, especially overnight.  She denies dysuria or gross hematuria.  She feels she is ready for next intravesical Botox treatment and would like to proceed.  PMH: Past Medical History:  Diagnosis Date   Arthritis    Bladder cancer ([place]) 2017   Chronic low back pain    Depression    Diabetes mellitus without complication ([place])    History of blood transfusion    History of iron deficiency anemia    History of pleurisy    History of pneumonia    Hyperlipidemia 08/07/2016   Hypertension    Hypothyroidism    Migraines    Personal history of chemotherapy    Bladder Ca   Urge incontinence of urine 08/07/2016    Surgical History: Past Surgical History:  Procedure Laterality Date   ABDOMINAL HYSTERECTOMY     APPENDECTOMY  1961   bladder cancer removed  2014   BOTOX INJECTION N/A 11/08/2020   Procedure: BOTOX INJECTION;  Surgeon: Hollice Espy, MD;  Location: [facility] OR[facility];  Service: Urology;  Laterality: N/A;   CATARACT EXTRACTION W/ INTRAOCULAR LENS  IMPLANT, BILATERAL     complete hysterectomy  1999   DILATION AND CURETTAGE OF UTERUS  1999   IR RADIOLOGIST EVAL & MGMT  07/20/2020   IR RADIOLOGIST EVAL & MGMT  10/19/2020   IR RADIOLOGIST EVAL & MGMT  12/02/2020   IR RADIOLOGIST EVAL & MGMT  03/15/2021   IR RADIOLOGIST EVAL & MGMT  08/04/2021   L rotator cuff Left 2010   R thumb ruptured ligament and tendon  2012   RADIOLOGY WITH ANESTHESIA Right 10/27/2020   Procedure: IR WITH  ANESTHESIA RIGHT RENAL CRYOABLATION;  Surgeon: Sandi Mariscal, MD;  Location: [facility];  Service: Anesthesiology;  Laterality: Right;   REVERSE SHOULDER ARTHROPLASTY Right 11/20/2017   Procedure: REVERSE SHOULDER ARTHROPLASTY;  Surgeon: Corky Mull, MD;  Location: [facility] OR[facility];  Service: Orthopedics;  Laterality: Right;   SHOULDER ARTHROSCOPY WITH BICEPSTENOTOMY Right 01/30/2017   Procedure: SHOULDER ARTHROSCOPY WITH BICEPSTENOTOMY;  Surgeon: Corky Mull, MD;  Location: [facility];  Service: Orthopedics;  Laterality: Right;  biceps tenolysis   SHOULDER ARTHROSCOPY WITH OPEN ROTATOR CUFF REPAIR Right 01/30/2017   Procedure: SHOULDER ARTHROSCOPY WITH OPEN ROTATOR CUFF REPAIR;  Surgeon: Corky Mull, MD;  Location: [facility];  Service: Orthopedics;  Laterality: Right;  Massive rotator cuff tear repair    Home Medications:  Allergies as of 10/10/2021       Reactions   Statins Other (See Comments)   myalgia        Medication List        Accurate as of October 10, 2021  2:57 PM. If you have any questions, ask your nurse or doctor.          aspirin 81 MG chewable tablet Chew 81 mg by mouth daily.   baclofen 10 MG tablet Commonly known as: LIORESAL Take 1 tablet (10 mg total) by mouth at bedtime.   escitalopram 10 MG  tablet Commonly known as: LEXAPRO TAKE 1 TABLET(10 MG) BY MOUTH DAILY   gabapentin 300 MG capsule Commonly known as: NEURONTIN TAKE 1 CAPSULE(300 MG) BY MOUTH AT BEDTIME   ICY HOT LIDOCAINE PLUS MENTHOL EX Apply 1 application topically 2 (two) times daily as needed (muscle pain).   levothyroxine 75 MCG tablet Commonly known as: SYNTHROID Take 1 tablet (75 mcg total) by mouth daily before breakfast.   losartan 25 MG tablet Commonly known as: COZAAR TAKE 1 TABLET(25 MG) BY MOUTH DAILY   metFORMIN 500 MG 24 hr tablet Commonly known as: GLUCOPHAGE-XR TAKE 2 TABLETS(1000 MG) BY MOUTH IN THE MORNING AND AT BEDTIME   mirabegron ER 50 MG Tb24 tablet Commonly known as:  [name] Take 1 tablet (50 mg total) by mouth daily.   montelukast 10 MG tablet Commonly known as: SINGULAIR Take 1 tablet (10 mg total) by mouth at bedtime.   multivitamin with minerals Tabs tablet Take 1 tablet by mouth daily.   naproxen 500 MG tablet Commonly known as: NAPROSYN TAKE 1 TABLET(500 MG) BY MOUTH TWICE DAILY WITH A MEAL        Allergies:  Allergies  Allergen Reactions   Statins Other (See Comments)    myalgia    Family History: Family History  Problem Relation Age of Onset   Hypertension Brother    Diabetes Brother    Healthy Son    Healthy Son    Healthy Daughter    Kidney cancer Neg Hx    Bladder Cancer Neg Hx    Breast cancer Neg Hx     Social History:   reports that she quit smoking about 14 years ago. Her smoking use included cigarettes. She has never used smokeless tobacco. She reports current alcohol use of about 7.0 standard drinks per week. She reports that she does not use drugs.  Physical Exam: BP 125/75   Pulse 70   Ht 5\' 5"  (1.651 m)   Wt 183 lb (83 kg)   BMI 30.45 kg/m   Constitutional:  Alert and oriented, no acute distress, nontoxic appearing HEENT: [place], AT Cardiovascular: No clubbing, cyanosis, or edema Respiratory: Normal respiratory effort, no increased work of breathing Skin: No rashes, bruises or suspicious lesions Neurologic: Grossly intact, no focal deficits, moving all 4 extremities Psychiatric: Normal mood and affect  Assessment & Plan:   1. OAB (overactive bladder) Symptomatic worsening 5-1/2 months following her most recent intravesical Botox treatment with Dr. Erlene Quan.  We will plan to schedule her next 1 soonest available.  Patient is in agreement with this plan.  Return in about 18 days (around 10/28/2021) for Intravesical Botox with Dr. Erlene Quan.  Debroah Loop, PA-C  [facility] [address], [place], [place] [address] [phone]

## 2021-10-17 ENCOUNTER — Telehealth: Payer: Self-pay

## 2021-10-17 NOTE — Chronic Care Management (AMB) (Signed)
Error

## 2021-10-17 NOTE — Progress Notes (Addendum)
I have called the [phone] number to see what criteria is for [name] medication and was not able to get that information but was able to request Patient assistance form and patient authorization to be emailed to me. I should be receiving the e-mail with 24 hours. I have attempted to contact patient to update her on this, but she did not answer but I have left a voicemail for the patient to return my call. I have prefilled and will mail out the form to the patient on 10/21/21 so the patient get her urologist to complete the forma fax it to [place] to get patient assistance. I have attempted to reach out to patient but she has not answer and Ieft a voicemail for the patient to return my phone call.  Corrie Mckusick, [place]

## 2021-10-18 ENCOUNTER — Other Ambulatory Visit: Payer: Medicare Other

## 2021-10-18 ENCOUNTER — Other Ambulatory Visit: Payer: Self-pay

## 2021-10-18 DIAGNOSIS — N898 Other specified noninflammatory disorders of vagina: Secondary | ICD-10-CM | POA: Diagnosis not present

## 2021-10-18 DIAGNOSIS — N3281 Overactive bladder: Secondary | ICD-10-CM

## 2021-10-18 DIAGNOSIS — R300 Dysuria: Secondary | ICD-10-CM | POA: Diagnosis not present

## 2021-10-18 LAB — URINALYSIS, COMPLETE
Bilirubin, UA: NEGATIVE
Glucose, UA: NEGATIVE
Ketones, UA: NEGATIVE
Leukocytes,UA: NEGATIVE
Nitrite, UA: NEGATIVE
Protein,UA: NEGATIVE
RBC, UA: NEGATIVE
Specific Gravity, UA: 1.02 (ref 1.005–1.030)
Urobilinogen, Ur: 0.2 mg/dL (ref 0.2–1.0)
pH, UA: 5.5 (ref 5.0–7.5)

## 2021-10-18 LAB — MICROSCOPIC EXAMINATION
Bacteria, UA: NONE SEEN
RBC, Urine: NONE SEEN /hpf (ref 0–2)

## 2021-10-21 LAB — CULTURE, URINE COMPREHENSIVE

## 2021-10-26 ENCOUNTER — Telehealth: Payer: Self-pay

## 2021-10-26 NOTE — Chronic Care Management (AMB) (Signed)
    Chronic Care Management Pharmacy Assistant   Name: Stacy Mercado  MRN: [id number] DOB: 1946/05/31  Reason for Encounter: Patient assistance application on [name] 50 mg   Medications: Outpatient Encounter Medications as of 10/26/2021  Medication Sig   aspirin 81 MG chewable tablet Chew 81 mg by mouth daily.    baclofen (LIORESAL) 10 MG tablet Take 1 tablet (10 mg total) by mouth at bedtime.   escitalopram (LEXAPRO) 10 MG tablet TAKE 1 TABLET(10 MG) BY MOUTH DAILY   gabapentin (NEURONTIN) 300 MG capsule TAKE 1 CAPSULE(300 MG) BY MOUTH AT BEDTIME   levothyroxine (SYNTHROID) 75 MCG tablet Take 1 tablet (75 mcg total) by mouth daily before breakfast.   Lidocaine-Menthol (ICY HOT LIDOCAINE PLUS MENTHOL EX) Apply 1 application topically 2 (two) times daily as needed (muscle pain).   losartan (COZAAR) 25 MG tablet TAKE 1 TABLET(25 MG) BY MOUTH DAILY   metFORMIN (GLUCOPHAGE-XR) 500 MG 24 hr tablet TAKE 2 TABLETS(1000 MG) BY MOUTH IN THE MORNING AND AT BEDTIME   mirabegron ER ([name]) 50 MG TB24 tablet Take 1 tablet (50 mg total) by mouth daily.   montelukast (SINGULAIR) 10 MG tablet Take 1 tablet (10 mg total) by mouth at bedtime.   Multiple Vitamin (MULTIVITAMIN WITH MINERALS) TABS tablet Take 1 tablet by mouth daily.   naproxen (NAPROSYN) 500 MG tablet TAKE 1 TABLET(500 MG) BY MOUTH TWICE DAILY WITH A MEAL   No facility-administered encounter medications on file as of 10/26/2021.    Prefilled and mailed patient assistance application for patient to complete the form of [name] 50 mg so patient can send to her Urologist to see if she can qualify to apply. I have attempted to reach out to the patient about this form but was unsuccessful attempt. I have left clear information about this on the form for further help for the patient.  Corrie Mckusick, [place]

## 2021-10-31 ENCOUNTER — Telehealth: Payer: Self-pay | Admitting: Family Medicine

## 2021-10-31 NOTE — Progress Notes (Signed)
   11/01/21  CC:  Chief Complaint  Patient presents with   Botulinum Toxin Injection     HPI: Stacy Mercado is a 75 y.o.female with a personal history of OAB, bladder cancer, and right renal mass, who presents today for Botox injections and surveillance cystoscopy.   She is also current on [name] in addition to Botox.   She is s/p ablation followed by interventional radiology for her right renal mass.   She was diagnosed with bladder tumors (low grade superficial) in October 2015 and  March 2016. She underwent induction BCG completed in March 2016. as well as maintenance with cytology and was followed with cystoscopy every 3 months.  Her last BCG maintenance BCG was in 11/2015.  No recurrences.    Vitals:   11/01/21 1049  Pulse: 67   NED. A&Ox3.   No respiratory distress   Abd soft, NT, ND Normal external genitalia with patent urethral meatus  Cystoscopy Procedure Note  Patient identification was confirmed, informed consent was obtained, and patient was prepped using Betadine solution.  Lidocaine jelly was administered per urethral meatus.    Procedure: - Flexible cystoscope introduced, without any difficulty.   - Thorough search of the bladder revealed:    normal urethral meatus    normal urothelium    no stones    no ulcers     no tumors    no urethral polyps    no trabeculation  - Ureteral orifices were normal in position and appearance.  At this point in time, using a flexible needle, a total of 10 injections and 2 rows of 5 were administered in the posterior bladder wall.  Each of these was 1 cc distributing a total of 100 units into the bladder.  It is well-tolerated.    She did receive periprocedural antibiotics.  Assessment/ Plan:  OAB  - Uncomplicated Botox injection, will likely be due for another injection about 5-6 months   Continue [name] 50 mg as needed   - lidocaine (XYLOCAINE) 2 % (with pres) injection 1,000 mg - botulinum toxin Type  A (BOTOX) injection 100 Units  History of bladder cancer No evidence of recurrence - Continue cystoscopy at the time of each Botox     I,Kailey Littlejohn,acting as a scribe for Hollice Espy, MD.,have documented all relevant documentation on the behalf of Hollice Espy, MD,as directed by  Hollice Espy, MD while in the presence of Hollice Espy, MD.  I have reviewed the above documentation for accuracy and completeness, and I agree with the above.   Hollice Espy, MD

## 2021-10-31 NOTE — Telephone Encounter (Signed)
Pt dropped off [employer] prescription paperwork for provider to complete and fax.  Pt asked to be called at [phone] when completed so she can pick it up and paperwork needs to be faxed to [phone] please.  Placed in provider's folder.

## 2021-11-01 ENCOUNTER — Ambulatory Visit (INDEPENDENT_AMBULATORY_CARE_PROVIDER_SITE_OTHER): Payer: Medicare Other | Admitting: Urology

## 2021-11-01 ENCOUNTER — Other Ambulatory Visit: Payer: Self-pay

## 2021-11-01 ENCOUNTER — Encounter: Payer: Self-pay | Admitting: Urology

## 2021-11-01 VITALS — HR 67 | Ht 65.0 in | Wt 183.0 lb

## 2021-11-01 DIAGNOSIS — N3281 Overactive bladder: Secondary | ICD-10-CM | POA: Diagnosis not present

## 2021-11-01 MED ORDER — ONABOTULINUMTOXINA 100 UNITS IJ SOLR
100.0000 [IU] | Freq: Once | INTRAMUSCULAR | Status: AC
  Administered 2021-11-01: 100 [IU] via INTRAMUSCULAR

## 2021-11-01 MED ORDER — LIDOCAINE HCL 2 % IJ SOLN
60.0000 mL | Freq: Once | INTRAMUSCULAR | Status: AC
  Administered 2021-11-01: 1200 mg

## 2021-11-01 MED ORDER — SULFAMETHOXAZOLE-TRIMETHOPRIM 800-160 MG PO TABS
1.0000 | ORAL_TABLET | Freq: Once | ORAL | Status: AC
  Administered 2021-11-01: 1 via ORAL

## 2021-11-01 MED ORDER — SULFAMETHOXAZOLE-TRIMETHOPRIM 800-160 MG PO TABS: 1.0000 | ORAL_TABLET | Freq: Two times a day (BID) | ORAL | Status: DC

## 2021-11-01 NOTE — Progress Notes (Signed)
Bladder Instillation  Due to Botox patient is present today for a Bladder Instillation of 2% liodcaine. Patient was cleaned and prepped in a sterile fashion with betadine and lidocaine 2% jelly was instilled into the urethra.  A 14FR catheter was inserted, urine return was noted 51ml, urine was yellow in color.  60 ml 2%lidocaine was instilled into the bladder. The catheter was then removed. Patient tolerated well, no complications were noted Patient held in bladder for 30 minutes prior to procedure starting.   Performed by: Blondell Reveal

## 2021-11-01 NOTE — Patient Instructions (Signed)
Botulinum Toxin Bladder Injection A botulinum toxin bladder injection is a procedure to treat an overactive bladder. During the procedure, a drug called botulinum toxin is injected into the bladder through a long, thin needle. This drug relaxes the bladder muscles and reduces overactivity. You may need this procedure if your medicines are not working or you cannot take them. The procedure may be repeated as needed. The treatment is done once and it usually lasts for 6 months. Your health care provider will monitor you to see how well you respond. Tell a health care provider about: Any allergies you have. All medicines you are taking, including vitamins, herbs, eye drops, creams, and over-the-counter medicines. Any problems you or family members have had with anesthetic medicines. Any bleeding problems you have. Any surgeries you have had. Any medical conditions you have. Any previous reactions to a botulinum toxin injection. Any symptoms of urinary tract infection. These include chills, fever, a burning feeling when passing urine, and needing to pass urine often. Whether you are pregnant or may be pregnant. What are the risks? Generally this is a safe procedure. However, problems may occur, including: Not being able to pass urine. If this happens, you may need to have your bladder emptied with a thin tube (urinary catheter). Bleeding. Urinary tract infection. Allergic reaction to the botulinum toxin. Pain or burning when passing urine. Damage to nearby structures or organs. What happens before the procedure? When to stop eating and drinking Follow instructions from your health care provider about what you may eat and drink before your procedure. These may include: 8 hours before the procedure Stop eating most foods. Do not eat meat, fried foods, or fatty foods. Eat only light foods, such as toast or crackers. All liquids are okay except energy drinks and alcohol. 6 hours before the  procedure Stop eating. Drink only clear liquids, such as water, clear fruit juice, black coffee, plain tea, and sports drinks. Do not drink energy drinks or alcohol. 2 hours before the procedure Stop drinking all liquids. You may be allowed to take medicines with small sips of water. If you do not follow your health care provider's instructions, your procedure may be delayed or canceled. Medicines Ask your health care provider about: Changing or stopping your regular medicines. This is especially important if you are taking diabetes medicines or blood thinners. Taking medicines such as aspirin and ibuprofen. These medicines can thin your blood. Do not take these medicines unless your health care provider tells you to take them. Taking over-the-counter medicines, vitamins, herbs, and supplements. General instructions Ask your health care provider what steps will be taken to help prevent infection. These steps may include: Removing hair at the procedure site. Washing skin with a germ-killing soap. Taking antibiotic medicine. If you will be going home right after the procedure, plan to have a responsible adult: Take you home from the hospital or clinic. You will not be allowed to drive. Care for you for the time you are told. What happens during the procedure?  You will be asked to empty your bladder. An IV will be inserted into one of your veins. You will be given one or more of the following: A medicine to help you relax (sedative). A medicine to numb the area (local anesthetic). A medicine to make you fall asleep (general anesthetic). A long, thin scope called a cystoscope will be passed into your bladder through the part of the body that carries urine from your bladder (urethra). The cystoscope will  be used to fill your bladder with water. A long needle will be passed through the cystoscope and into the bladder. The botulinum toxin will be injected into your bladder. It may be  injected into multiple areas of your bladder. The cystoscope will be removed and your bladder will be emptied with a urinary catheter. The procedure may vary among health care providers and hospitals. What can I expect after the procedure? After your procedure, it is common to have: Blood-tinged urine. Burning or soreness when you pass urine. Follow these instructions at home: Medicines Take over-the-counter and prescription medicines only as told by your health care provider. If you were prescribed an antibiotic medicine, take it as told by your health care provider. Do not stop using the antibiotic even if you start to feel better. General instructions  If you were given a sedative during the procedure, it can affect you for several hours. Do not drive or operate machinery until your health care provider says that it is safe. Drink enough fluid to keep your urine pale yellow. Return to your normal activities as told by your health care provider. Ask your health care provider what activities are safe for you. Keep all follow-up visits. Contact a health care provider if you have: A fever or chills. Blood-tinged urine for more than one day after your procedure. Worsening pain or burning when you pass urine. Pain or burning when passing urine for more than two days after your procedure. Trouble emptying your bladder. Get help right away if you: Have bright red blood in your urine. Are unable to pass urine. Summary A botulinum toxin bladder injection is a procedure to treat an overactive bladder. This is generally a safe procedure. However, problems may occur, including not being able to pass urine, bleeding, infection, pain, and an allergic reaction to the botulinum toxin. You will be told when to stop eating and drinking, and what medicines to change or stop. Follow instructions carefully. After the procedure, it is common to have blood in your urine and to have soreness or burning when  passing urine. Contact a health care provider if you have a fever, blood in your urine for more than a few days, or trouble passing urine. Get help right away if you have bright red blood in your urine, or if you are unable to pass urine. This information is not intended to replace advice given to you by your health care provider. Make sure you discuss any questions you have with your health care provider. Document Revised: 06/10/2021 Document Reviewed: 06/10/2021 Elsevier Patient Education  [place].

## 2021-11-07 ENCOUNTER — Ambulatory Visit (INDEPENDENT_AMBULATORY_CARE_PROVIDER_SITE_OTHER): Payer: Medicare Other

## 2021-11-07 DIAGNOSIS — E782 Mixed hyperlipidemia: Secondary | ICD-10-CM

## 2021-11-07 DIAGNOSIS — M791 Myalgia, unspecified site: Secondary | ICD-10-CM

## 2021-11-07 DIAGNOSIS — E119 Type 2 diabetes mellitus without complications: Secondary | ICD-10-CM

## 2021-11-07 NOTE — Progress Notes (Signed)
Chronic Care Management Pharmacy Note  11/09/2021 Name:  Stacy Mercado MRN:  [id number] DOB:  15-Jan-1946  Summary: Status of repatha PAP - [employer] behind in processing, could not confirm if they have received application. Will reach out to pt to discuss next steps  Subjective: Stacy Mercado is an 75 y.o. year old female who is a primary patient of Valerie Roys, DO.  The CCM team was consulted for assistance with disease management and care coordination needs.    Engaged with patient by telephone for follow up visit in response to provider referral for pharmacy case management and/or care coordination services.   Consent to Services:  The patient was given the following information about Chronic Care Management services today, agreed to services, and gave verbal consent: 1. CCM service includes personalized support from designated clinical staff supervised by the primary care provider, including individualized plan of care and coordination with other care providers 2. 24/7 contact phone numbers for assistance for urgent and routine care needs. 3. Service will only be billed when office clinical staff spend 20 minutes or more in a month to coordinate care. 4. Only one practitioner may furnish and bill the service in a calendar month. 5.The patient may stop CCM services at any time (effective at the end of the month) by phone call to the office staff. 6. The patient will be responsible for cost sharing (co-pay) of up to 20% of the service fee (after annual deductible is met). Patient agreed to services and consent obtained.  Patient Care Team: Valerie Roys, DO as PCP - General (Family Medicine) Vladimir Faster, [facility] as Pharmacist (Pharmacist)  Hospital visits: None in previous 6 months  Objective:  Lab Results  Component Value Date   CREATININE 0.80 07/15/2021   CREATININE 0.81 07/04/2021   CREATININE 0.70 03/11/2021    Lab Results  Component Value Date   HGBA1C 6.8  07/04/2021   HGBA1C 6.3 01/11/2021   HGBA1C 7.3 (H) 10/25/2020   Last diabetic Eye exam:  Lab Results  Component Value Date/Time   HMDIABEYEEXA No Retinopathy 09/12/2016 04:27 PM    Last diabetic Foot exam: No results found for: HMDIABFOOTEX      Component Value Date/Time   CHOL 152 07/04/2021 1112   CHOL 124 01/11/2021 1000   CHOL 112 05/03/2020 1529   CHOL 211 (H) 08/14/2016 1025   TRIG 178 (H) 07/04/2021 1112   TRIG 146 01/11/2021 1000   TRIG 117 05/03/2020 1529   TRIG 226 (H) 08/14/2016 1025   HDL 47 07/04/2021 1112   HDL 42 01/11/2021 1000   HDL 46 05/03/2020 1529   VLDL 45 (H) 08/14/2016 1025   LDLCALC 75 07/04/2021 1112   LDLCALC 57 01/11/2021 1000   LDLCALC 45 05/03/2020 1529    Hepatic Function Latest Ref Rng & Units 07/04/2021 01/11/2021 05/03/2020  Total Protein 6.0 - 8.5 g/dL 6.3 6.3 6.7  Albumin 3.7 - 4.7 g/dL 4.1 4.1 4.0  AST 0 - 40 IU/L '19 22 19  ' ALT 0 - 32 IU/L '23 23 28  ' Alk Phosphatase 44 - 121 IU/L 64 69 86  Total Bilirubin 0.0 - 1.2 mg/dL 0.5 0.5 0.6    Lab Results  Component Value Date/Time   TSH 4.080 08/17/2021 09:45 AM   TSH 4.570 (H) 07/04/2021 11:12 AM    CBC Latest Ref Rng & Units 07/04/2021 10/27/2020 10/25/2020  WBC 3.4 - 10.8 x10E3/uL 6.6 6.6 5.9  Hemoglobin 11.1 - 15.9 g/dL 12.6 13.3  12.5  Hematocrit 34.0 - 46.6 % 38.8 41.6 39.2  Platelets 150 - 450 x10E3/uL 173 186 180    No results found for: VD25OH  Clinical ASCVD:  The 10-year ASCVD risk score (Arnett DK, et al., 2019) is: 37.9%   Values used to calculate the score:     Age: 58 years     Sex: Female     Is Non-Hispanic African American: No     Diabetic: Yes     Tobacco smoker: No     Systolic Blood Pressure: 035 mmHg     Is BP treated: Yes     HDL Cholesterol: 47 mg/dL     Total Cholesterol: 152 mg/dL   Social History   Tobacco Use  Smoking Status Former   Years: 50.00   Types: Cigarettes   Quit date: 12/18/2006   Years since quitting: 14.9  Smokeless Tobacco Never    BP Readings from Last 3 Encounters:  10/10/21 125/75  09/01/21 126/74  07/04/21 117/66   Pulse Readings from Last 3 Encounters:  11/01/21 67  10/10/21 70  09/01/21 65   Wt Readings from Last 3 Encounters:  11/01/21 183 lb (83 kg)  10/10/21 183 lb (83 kg)  09/01/21 182 lb (82.6 kg)   BMI Readings from Last 3 Encounters:  11/01/21 30.45 kg/m  10/10/21 30.45 kg/m  09/01/21 30.66 kg/m    Assessment: Review of patient past medical history, allergies, medications, health status, including review of consultants reports, laboratory and other test data, was performed as part of comprehensive evaluation and provision of chronic care management services.   SDOH:  (Social Determinants of Health) assessments and interventions performed: Yes   CCM Care Plan  Allergies  Allergen Reactions   Statins Other (See Comments)    myalgia    Medications Reviewed Today     Reviewed by Madelin Rear, [facility] (Pharmacist) on 11/07/21 at 1451  Med List Status: <None>   Medication Order Taking? Sig Documenting Provider Last Dose Status Informant  aspirin 81 MG chewable tablet [id number] No Chew 81 mg by mouth daily.  [provider] Taking Active Self  baclofen (LIORESAL) 10 MG tablet [id number] No Take 1 tablet (10 mg total) by mouth at bedtime. Park Liter P, DO Taking Active   escitalopram (LEXAPRO) 10 MG tablet [id number] No TAKE 1 TABLET(10 MG) BY MOUTH DAILY Wynetta Emery, Megan P, DO Taking Active   gabapentin (NEURONTIN) 300 MG capsule [id number] No TAKE 1 CAPSULE(300 MG) BY MOUTH AT BEDTIME Johnson, Megan P, DO Taking Active   levothyroxine (SYNTHROID) 75 MCG tablet [id number] No Take 1 tablet (75 mcg total) by mouth daily before breakfast. Park Liter P, DO Taking Active   Lidocaine-Menthol (ICY HOT LIDOCAINE PLUS MENTHOL EX) [id number] No Apply 1 application topically 2 (two) times daily as needed (muscle pain). [provider] Taking Active   losartan (COZAAR) 25 MG tablet  [id number] No TAKE 1 TABLET(25 MG) BY MOUTH DAILY Johnson, Megan P, DO Taking Active   metFORMIN (GLUCOPHAGE-XR) 500 MG 24 hr tablet [id number] No TAKE 2 TABLETS(1000 MG) BY MOUTH IN THE MORNING AND AT BEDTIME Johnson, Megan P, DO Taking Active   mirabegron ER ([name]) 50 MG TB24 tablet [id number] No Take 1 tablet (50 mg total) by mouth daily. Debroah Loop, PA-C Taking Active   montelukast (SINGULAIR) 10 MG tablet [id number] No Take 1 tablet (10 mg total) by mouth at bedtime. Park Liter P, DO Taking Active   Multiple Vitamin (MULTIVITAMIN WITH MINERALS) TABS tablet [id number] No  Take 1 tablet by mouth daily. 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[place]) 10/27/2020   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/22/2020   Chronic bilateral low back pain with left-sided sciatica 73/53/2992   Lichen sclerosus 42/68/3419   Vulvar irritation 04/09/2019   Osteopenia 06/14/2018   Hypothyroidism 04/26/2018   Status post reverse total shoulder replacement, 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   Complex tear of lateral meniscus of right knee as current injury 08/03/2017   Primary osteoarthritis of right knee 08/03/2017   Advance directive discussed with patient 03/13/2017   Cataracts, bilateral 08/14/2016   Rotator cuff tendinitis, right 08/14/2016   Right kidney mass 08/07/2016   Hyperlipidemia 08/07/2016   Urge incontinence of urine 08/07/2016   Spondylosis of cervical spine 08/07/2016   Bladder cancer ([place])    Controlled type 2 diabetes mellitus ([place])    Depression    Benign hypertensive renal disease    PVD (peripheral vascular disease) ([place])     Immunization History   Administered Date(s) Administered   Influenza, High Dose Seasonal PF 08/28/2016, 09/20/2017, 09/17/2018, 08/07/2019   PFIZER(Purple Top)SARS-COV-2 Vaccination 01/13/2020, 02/03/2020, 09/25/2020, 03/27/2021   Pneumococcal Conjugate-13 06/29/2015, 09/27/2016   Pneumococcal Polysaccharide-23 08/06/2013, 10/28/2020   Tdap 11/28/2016   Zoster, Live 08/06/2013    Conditions to be addressed/monitored: HLD HTN T2DM OAB  Care Plan : [place]  Updates made by Madelin Rear, [facility] since 11/09/2021 12:00 AM     Problem: DM2, HTN, HLD, Depression, Osteopenia, chronic pain, urge incontinence   Priority: High     Long-Range Goal: Disease Management   Start Date: 04/25/2021  Recent Progress: On track  Priority: High  Note:    Current Barriers:  Unable to independently afford treatment regimen  Pharmacist Clinical Goal(s):  Patient will verbalize ability to afford treatment regimen through collaboration with PharmD and provider.   Interventions: 1:1 collaboration with Valerie Roys, DO regarding development and update of comprehensive plan of care as evidenced by provider attestation and co-signature Inter-disciplinary care team collaboration (see longitudinal plan of care) Comprehensive medication review performed; medication list updated in electronic medical record  Hyperlipidemia: (LDL goal < 100) -Controlled -Current treatment: Repatha 140 mg every 14 days  -Medications previously tried: statin intolerant (myalgia)  -Current dietary patterns: portion control -Current exercise habits: walking -Educated on Cholesterol goals;  Importance of limiting foods high in cholesterol; Reviewed finances - repatha patient assistance needs to be resubmitted for 2023. Form dropped off at office for provider earlier this month. Unable to confirm receipt due [employer]gen being several weeks behind in processing. Will reach out to patient - can either recertify online or redo paperwork if  wanting to play it safe   -Recommended to continue current medication  Diabetes (A1c goal <7%) -Controlled -Current medications: Metformin 1000 mg BID -Medications previously tried: n/a  -Current home glucose readings fasting glucose: not testing post prandial glucose: n/a -Denies hypoglycemic/hyperglycemic symptoms -Current meal patterns: portion control -Current exercise: walking -Educated on A1c and blood sugar goals; Exercise goal of 150 minutes per week; -Counseled to check feet daily and get yearly eye exams -Recommended to continue current  medication  Patient Goals/Self-Care Activities Patient will:  - take medications as prescribed as evidenced by patient report and record review  Medication Assistance: Application for repatha  medication assistance program. in process.  Anticipated assistance start date 12/2021.  See plan of care for additional detail.  Patient's preferred pharmacy is:  [id number] [address] Heal, [place] AT [facility] OF [address] [place] [address] Phone: [phone] Fax: [phone]    Pt endorses 100% compliance  Follow Up:  Patient agrees to Care Plan and Follow-up. Plan: HC 1 month PAP check in. Pharmacist 6 month f/u visit   Future Appointments  Date Time Provider [place]  01/04/2022 11:00 AM Valerie Roys, DO [facility]FP-[facility]  01/16/2022 11:15 AM CFP NURSE HEALTH ADVISOR [facility]FP-[facility]FP [facility]  05/10/2022  9:00 AM CFP CCM PHARMACY CFP-CFP [facility]   Madelin Rear, PharmD, BCGP Clinical Pharmacist  [phone]

## 2021-11-16 DIAGNOSIS — E782 Mixed hyperlipidemia: Secondary | ICD-10-CM | POA: Diagnosis not present

## 2021-11-16 DIAGNOSIS — E119 Type 2 diabetes mellitus without complications: Secondary | ICD-10-CM | POA: Diagnosis not present

## 2021-11-24 ENCOUNTER — Telehealth: Payer: Self-pay | Admitting: Family Medicine

## 2021-11-24 NOTE — Telephone Encounter (Signed)
[place] from [place] called in about needing the effective and date of patients [facility] insurance

## 2021-11-24 NOTE — Telephone Encounter (Signed)
Spoke with representative from [place] and let her know the information.

## 2021-12-15 ENCOUNTER — Telehealth: Payer: Self-pay | Admitting: Family Medicine

## 2021-12-15 NOTE — Telephone Encounter (Signed)
Pt is calling regarding her Repatha. Pt was approved to get a refill - 1 box with 6 refills. Pt normally receives 3 boxes at a time. One box is for 1 month. Pt normally picks up 3 boxes.  Pt is requesting if this can be sent to [facility], [place] [phone]  Pts number- [phone]

## 2021-12-22 MED ORDER — REPATHA 140 MG/ML ~~LOC~~ SOSY: 140.0000 mg | PREFILLED_SYRINGE | SUBCUTANEOUS | 3 refills | Status: AC

## 2021-12-22 NOTE — Telephone Encounter (Signed)
Called and discussed with patient. She states that she is no longer getting assistance with [employer] for this medication. She is requesting to have RX sent to [facility] in [place].   RX started and pended.

## 2021-12-31 ENCOUNTER — Other Ambulatory Visit: Payer: Self-pay | Admitting: Family Medicine

## 2021-12-31 NOTE — Telephone Encounter (Signed)
Requested Prescriptions  Pending Prescriptions Disp Refills   escitalopram (LEXAPRO) 10 MG tablet [Pharmacy Med Name: ESCITALOPRAM 10MG  TABLETS] 90 tablet 0    Sig: TAKE 1 TABLET(10 MG) BY MOUTH DAILY     Psychiatry:  Antidepressants - SSRI Passed - 12/31/2021  9:31 AM      Passed - Completed PHQ-2 or PHQ-9 in the last 360 days      Passed - Valid encounter within last 6 months    Recent Outpatient Visits          4 months ago Cervical radiculopathy   [place], Lauren A, NP   6 months ago Controlled type 2 diabetes mellitus without complication, without long-term current use of insulin ([place])   [place], Megan P, DO   11 months ago Dizziness   [employer], Megan P, DO   11 months ago Controlled type 2 diabetes mellitus without complication, without long-term current use of insulin ([place])   [place], [place], DO   1 year ago Dizziness   [place], DO      Future Appointments            In 2 weeks [facility], [facility]

## 2022-01-04 ENCOUNTER — Other Ambulatory Visit: Payer: Self-pay | Admitting: Interventional Radiology

## 2022-01-04 ENCOUNTER — Ambulatory Visit: Payer: Medicare Other | Admitting: Family Medicine

## 2022-01-04 DIAGNOSIS — N2889 Other specified disorders of kidney and ureter: Secondary | ICD-10-CM

## 2022-01-16 ENCOUNTER — Ambulatory Visit: Payer: Medicare Other

## 2022-02-06 ENCOUNTER — Telehealth: Payer: Self-pay

## 2022-02-06 NOTE — Telephone Encounter (Signed)
Received notification that patient's eligibility for Repatha patient assistance through [employer] will be lapsing. We need to send her a new application. Can you reach out to patient and coordinate completion of the application please?

## 2022-02-08 NOTE — Chronic Care Management (AMB) (Signed)
° ° °  Chronic Care Management Pharmacy Assistant   Name: Stacy Mercado  MRN: [id number] DOB: [birth year]  Reason for Encounter: Patient Assistance application for [place].     Medications: Outpatient Encounter Medications as of 02/06/2022  Medication Sig   aspirin 81 MG chewable tablet Chew 81 mg by mouth daily.    baclofen (LIORESAL) 10 MG tablet Take 1 tablet (10 mg total) by mouth at bedtime.   escitalopram (LEXAPRO) 10 MG tablet TAKE 1 TABLET(10 MG) BY MOUTH DAILY   Evolocumab (REPATHA) 140 MG/ML SOSY Inject 140 mg into the skin every 14 (fourteen) days.   gabapentin (NEURONTIN) 300 MG capsule TAKE 1 CAPSULE(300 MG) BY MOUTH AT BEDTIME   levothyroxine (SYNTHROID) 75 MCG tablet Take 1 tablet (75 mcg total) by mouth daily before breakfast.   Lidocaine-Menthol (ICY HOT LIDOCAINE PLUS MENTHOL EX) Apply 1 application topically 2 (two) times daily as needed (muscle pain).   losartan (COZAAR) 25 MG tablet TAKE 1 TABLET(25 MG) BY MOUTH DAILY   metFORMIN (GLUCOPHAGE-XR) 500 MG 24 hr tablet TAKE 2 TABLETS(1000 MG) BY MOUTH IN THE MORNING AND AT BEDTIME   mirabegron ER ([name]) 50 MG TB24 tablet Take 1 tablet (50 mg total) by mouth daily.   montelukast (SINGULAIR) 10 MG tablet Take 1 tablet (10 mg total) by mouth at bedtime.   Multiple Vitamin (MULTIVITAMIN WITH MINERALS) TABS tablet Take 1 tablet by mouth daily.   naproxen (NAPROSYN) 500 MG tablet TAKE 1 TABLET(500 MG) BY MOUTH TWICE DAILY WITH A MEAL   No facility-administered encounter medications on file as of 02/06/2022.   I have prefilled mailed patient assistance application for Repatha medication. I have attempted to reach out to the patient to explain what to fill out on the form. I have left a voicemail for her and a call back number. I have also left details on a sticky note on what she needs to complete and bring it back to [place] once completed.   Corrie Mckusick, [place]

## 2022-02-23 DIAGNOSIS — Z20822 Contact with and (suspected) exposure to covid-19: Secondary | ICD-10-CM | POA: Diagnosis not present

## 2022-03-07 ENCOUNTER — Telehealth: Payer: Self-pay

## 2022-03-07 DIAGNOSIS — Z20822 Contact with and (suspected) exposure to covid-19: Secondary | ICD-10-CM | POA: Diagnosis not present

## 2022-03-07 NOTE — Chronic Care Management (AMB) (Signed)
? ? ?  Chronic Care Management ?Pharmacy Assistant  ? ?Name: Stacy Mercado  MRN: [id number] DOB: September 19, 1946 ? ? ?Reason for Encounter: Disease State General ? ? ?Recent office visits:  ?None noted ? ?Recent consult visits:  ?None noted ? ?Hospital visits:  ?None in previous 6 months ? ?Medications: ?Outpatient Encounter Medications as of 03/07/2022  ?Medication Sig  ? aspirin 81 MG chewable tablet Chew 81 mg by mouth daily.   ? baclofen (LIORESAL) 10 MG tablet Take 1 tablet (10 mg total) by mouth at bedtime.  ? escitalopram (LEXAPRO) 10 MG tablet TAKE 1 TABLET(10 MG) BY MOUTH DAILY  ? Evolocumab (REPATHA) 140 MG/ML SOSY Inject 140 mg into the skin every 14 (fourteen) days.  ? gabapentin (NEURONTIN) 300 MG capsule TAKE 1 CAPSULE(300 MG) BY MOUTH AT BEDTIME  ? levothyroxine (SYNTHROID) 75 MCG tablet Take 1 tablet (75 mcg total) by mouth daily before breakfast.  ? Lidocaine-Menthol (ICY HOT LIDOCAINE PLUS MENTHOL EX) Apply 1 application topically 2 (two) times daily as needed (muscle pain).  ? losartan (COZAAR) 25 MG tablet TAKE 1 TABLET(25 MG) BY MOUTH DAILY  ? metFORMIN (GLUCOPHAGE-XR) 500 MG 24 hr tablet TAKE 2 TABLETS(1000 MG) BY MOUTH IN THE MORNING AND AT BEDTIME  ? mirabegron ER ([name]) 50 MG TB24 tablet Take 1 tablet (50 mg total) by mouth daily.  ? montelukast (SINGULAIR) 10 MG tablet Take 1 tablet (10 mg total) by mouth at bedtime.  ? Multiple Vitamin (MULTIVITAMIN WITH MINERALS) TABS tablet Take 1 tablet by mouth daily.  ? naproxen (NAPROSYN) 500 MG tablet TAKE 1 TABLET(500 MG) BY MOUTH TWICE DAILY WITH A MEAL  ? ?No facility-administered encounter medications on file as of 03/07/2022.  ? ?Patient she recently moved to [place] and no longer lives in [place]. Patient states she will a have a new PCP. I have cancelled her future appointment with [facility]. ? ?Care Gaps: ?Zoster Vaccines:Never done ? ?Star Rating Drugs: ?Metformin 500 mg Last filled:12/31/21 90 DS ?Losartan 25 mg Last filled:01/02/22 90  DS ?Repatha 140 mg Last filled:12/16/21 84 DS ? ?Corrie Mckusick, RMA ?Health Concierge ? ?

## 2022-04-12 ENCOUNTER — Other Ambulatory Visit: Payer: Self-pay | Admitting: Family Medicine

## 2022-04-13 NOTE — Telephone Encounter (Signed)
Requested medications are due for refill today.  yes ? ?Requested medications are on the active medications list.  yes ? ?Last refill. Gabapentin 10/04/2021 390 1 refill, Losartan 10/04/2021 #90 0 refills ? ?Future visit scheduled.   no ? ?Notes to clinic.  It appears that the pt is seeing a new PCP. ? ? ? ?Requested Prescriptions  ?Pending Prescriptions Disp Refills  ? gabapentin (NEURONTIN) 300 MG capsule [Pharmacy Med Name: GABAPENTIN '300MG'$  CAPSULES] 90 capsule 1  ?  Sig: TAKE 1 CAPSULE(300 MG) BY MOUTH AT BEDTIME  ?  ? Neurology: Anticonvulsants - gabapentin Passed - 04/12/2022  4:40 PM  ?  ?  Passed - Cr in normal range and within 360 days  ?  Creatinine, Ser  ?Date Value Ref Range Status  ?07/15/2021 0.80 0.44 - 1.00 mg/dL Final  ?  ?  ?  ?  Passed - Completed PHQ-2 or PHQ-9 in the last 360 days  ?  ?  Passed - Valid encounter within last 12 months  ?  Recent Outpatient Visits   ? ?      ? 7 months ago Cervical radiculopathy  ? [place], NP  ? 9 months ago Controlled type 2 diabetes mellitus without complication, without long-term current use of insulin ([place])  ? [place], Megan P, DO  ? 1 year ago Dizziness  ? [place], [place] P, DO  ? 1 year ago Controlled type 2 diabetes mellitus without complication, without long-term current use of insulin ([place])  ? [place], Megan P, DO  ? 1 year ago Dizziness  ? [place], DO  ? ?  ?  ? ? ?  ?  ?  ? losartan (COZAAR) 25 MG tablet [Pharmacy Med Name: LOSARTAN '25MG'$  TABLETS] 90 tablet 0  ?  Sig: TAKE 1 TABLET(25 MG) BY MOUTH DAILY  ?  ? Cardiovascular:  Angiotensin Receptor Blockers Failed - 04/12/2022  4:40 PM  ?  ?  Failed - Cr in normal range and within 180 days  ?  Creatinine, Ser  ?Date Value Ref Range Status  ?07/15/2021 0.80 0.44 - 1.00 mg/dL Final  ?  ?  ?  ?  Failed - K in normal range and within 180 days  ?  Potassium  ?Date Value  Ref Range Status  ?07/04/2021 4.3 3.5 - 5.2 mmol/L Final  ?  ?  ?  ?  Failed - Valid encounter within last 6 months  ?  Recent Outpatient Visits   ? ?      ? 7 months ago Cervical radiculopathy  ? [place], NP  ? 9 months ago Controlled type 2 diabetes mellitus without complication, without long-term current use of insulin ([place])  ? [place], Megan P, DO  ? 1 year ago Dizziness  ? [place], [place] P, DO  ? 1 year ago Controlled type 2 diabetes mellitus without complication, without long-term current use of insulin ([place])  ? [place], Megan P, DO  ? 1 year ago Dizziness  ? [facility] Myles Gip, DO  ? ?  ?  ? ? ?  ?  ?  Passed - Patient is not pregnant  ?  ?  Passed - Last BP in normal range  ?  BP Readings from Last 1 Encounters:  ?10/10/21 125/75  ?  ?  ?  ?  ?  ?

## 2022-05-10 ENCOUNTER — Telehealth: Payer: Medicare Other

## 2022-07-20 ENCOUNTER — Other Ambulatory Visit: Payer: Self-pay | Admitting: Family Medicine

## 2022-10-28 ENCOUNTER — Other Ambulatory Visit: Payer: Self-pay | Admitting: Family Medicine
